# Patient Record
Sex: Female | Born: 1940 | Race: White | Hispanic: No | Marital: Married | Smoking: Never smoker
Health system: Southern US, Community
[De-identification: ages and names within clinical notes are randomized; demographics above are authoritative.]

## PROBLEM LIST (undated history)

## (undated) DIAGNOSIS — I10 Essential (primary) hypertension: Secondary | ICD-10-CM

## (undated) DIAGNOSIS — M1990 Unspecified osteoarthritis, unspecified site: Secondary | ICD-10-CM

## (undated) DIAGNOSIS — W19XXXA Unspecified fall, initial encounter: Secondary | ICD-10-CM

---

## 1976-03-04 HISTORY — PX: ABDOMINAL HYSTERECTOMY: SHX81

## 1994-03-04 HISTORY — PX: JOINT REPLACEMENT: SHX530

## 1998-07-15 ENCOUNTER — Ambulatory Visit (HOSPITAL_COMMUNITY): Admission: RE | Admit: 1998-07-15 | Discharge: 1998-07-15 | Payer: Self-pay | Admitting: Neurosurgery

## 1998-07-15 ENCOUNTER — Encounter: Payer: Self-pay | Admitting: Neurosurgery

## 1999-07-06 ENCOUNTER — Encounter: Payer: Self-pay | Admitting: Neurosurgery

## 1999-07-06 ENCOUNTER — Encounter: Admission: RE | Admit: 1999-07-06 | Discharge: 1999-07-06 | Payer: Self-pay | Admitting: Neurosurgery

## 1999-08-13 ENCOUNTER — Encounter: Admission: RE | Admit: 1999-08-13 | Discharge: 1999-08-13 | Payer: Self-pay | Admitting: Family Medicine

## 1999-08-13 ENCOUNTER — Encounter: Payer: Self-pay | Admitting: Family Medicine

## 1999-08-20 ENCOUNTER — Encounter: Payer: Self-pay | Admitting: Neurosurgery

## 1999-08-22 ENCOUNTER — Encounter: Payer: Self-pay | Admitting: Neurosurgery

## 1999-08-22 ENCOUNTER — Inpatient Hospital Stay (HOSPITAL_COMMUNITY): Admission: RE | Admit: 1999-08-22 | Discharge: 1999-08-23 | Payer: Self-pay | Admitting: Neurosurgery

## 1999-09-19 ENCOUNTER — Encounter: Payer: Self-pay | Admitting: Neurosurgery

## 1999-09-19 ENCOUNTER — Encounter: Admission: RE | Admit: 1999-09-19 | Discharge: 1999-09-19 | Payer: Self-pay | Admitting: Neurosurgery

## 1999-10-23 ENCOUNTER — Encounter: Payer: Self-pay | Admitting: Neurosurgery

## 1999-10-23 ENCOUNTER — Encounter: Admission: RE | Admit: 1999-10-23 | Discharge: 1999-10-23 | Payer: Self-pay | Admitting: Neurosurgery

## 2000-01-02 ENCOUNTER — Encounter: Admission: RE | Admit: 2000-01-02 | Discharge: 2000-01-02 | Payer: Self-pay | Admitting: Neurosurgery

## 2000-01-02 ENCOUNTER — Encounter: Payer: Self-pay | Admitting: Neurosurgery

## 2000-01-20 ENCOUNTER — Ambulatory Visit (HOSPITAL_COMMUNITY): Admission: RE | Admit: 2000-01-20 | Discharge: 2000-01-20 | Payer: Self-pay | Admitting: Neurosurgery

## 2000-01-20 ENCOUNTER — Encounter: Payer: Self-pay | Admitting: Neurosurgery

## 2000-08-13 ENCOUNTER — Encounter: Payer: Self-pay | Admitting: Family Medicine

## 2000-08-13 ENCOUNTER — Encounter: Admission: RE | Admit: 2000-08-13 | Discharge: 2000-08-13 | Payer: Self-pay | Admitting: Family Medicine

## 2001-03-04 HISTORY — PX: BACK SURGERY: SHX140

## 2001-04-24 ENCOUNTER — Ambulatory Visit (HOSPITAL_COMMUNITY): Admission: RE | Admit: 2001-04-24 | Discharge: 2001-04-24 | Payer: Self-pay | Admitting: Gastroenterology

## 2001-04-24 ENCOUNTER — Encounter (INDEPENDENT_AMBULATORY_CARE_PROVIDER_SITE_OTHER): Payer: Self-pay

## 2001-10-07 ENCOUNTER — Encounter: Payer: Self-pay | Admitting: Family Medicine

## 2001-10-07 ENCOUNTER — Encounter: Admission: RE | Admit: 2001-10-07 | Discharge: 2001-10-07 | Payer: Self-pay | Admitting: Family Medicine

## 2002-10-13 ENCOUNTER — Encounter: Admission: RE | Admit: 2002-10-13 | Discharge: 2002-10-13 | Payer: Self-pay | Admitting: Family Medicine

## 2002-10-13 ENCOUNTER — Encounter: Payer: Self-pay | Admitting: Family Medicine

## 2003-07-31 ENCOUNTER — Emergency Department (HOSPITAL_COMMUNITY): Admission: EM | Admit: 2003-07-31 | Discharge: 2003-07-31 | Payer: Self-pay | Admitting: Emergency Medicine

## 2003-08-04 ENCOUNTER — Ambulatory Visit (HOSPITAL_COMMUNITY): Admission: RE | Admit: 2003-08-04 | Discharge: 2003-08-04 | Payer: Self-pay | Admitting: Orthopedic Surgery

## 2003-11-14 ENCOUNTER — Encounter: Admission: RE | Admit: 2003-11-14 | Discharge: 2003-11-14 | Payer: Self-pay | Admitting: Family Medicine

## 2004-06-13 ENCOUNTER — Ambulatory Visit (HOSPITAL_COMMUNITY): Admission: RE | Admit: 2004-06-13 | Discharge: 2004-06-13 | Payer: Self-pay | Admitting: Family Medicine

## 2004-12-12 ENCOUNTER — Encounter: Admission: RE | Admit: 2004-12-12 | Discharge: 2004-12-12 | Payer: Self-pay | Admitting: Family Medicine

## 2006-01-02 ENCOUNTER — Encounter: Admission: RE | Admit: 2006-01-02 | Discharge: 2006-01-02 | Payer: Self-pay | Admitting: Family Medicine

## 2006-03-04 HISTORY — PX: CHOLECYSTECTOMY: SHX55

## 2006-03-04 HISTORY — PX: SHOULDER SURGERY: SHX246

## 2007-01-13 ENCOUNTER — Encounter: Admission: RE | Admit: 2007-01-13 | Discharge: 2007-01-13 | Payer: Self-pay | Admitting: *Deleted

## 2007-03-12 ENCOUNTER — Other Ambulatory Visit: Admission: RE | Admit: 2007-03-12 | Discharge: 2007-03-12 | Payer: Self-pay | Admitting: Family Medicine

## 2008-01-14 ENCOUNTER — Encounter: Admission: RE | Admit: 2008-01-14 | Discharge: 2008-01-14 | Payer: Self-pay | Admitting: Family Medicine

## 2009-03-24 ENCOUNTER — Encounter: Admission: RE | Admit: 2009-03-24 | Discharge: 2009-03-24 | Payer: Self-pay | Admitting: Family Medicine

## 2010-03-20 ENCOUNTER — Encounter
Admission: RE | Admit: 2010-03-20 | Discharge: 2010-03-20 | Payer: Self-pay | Source: Home / Self Care | Attending: Family Medicine | Admitting: Family Medicine

## 2010-03-26 ENCOUNTER — Encounter
Admission: RE | Admit: 2010-03-26 | Discharge: 2010-03-26 | Payer: Self-pay | Source: Home / Self Care | Attending: Family Medicine | Admitting: Family Medicine

## 2010-03-27 ENCOUNTER — Encounter
Admission: RE | Admit: 2010-03-27 | Discharge: 2010-03-27 | Payer: Self-pay | Source: Home / Self Care | Attending: Family Medicine | Admitting: Family Medicine

## 2010-04-04 ENCOUNTER — Encounter: Payer: Self-pay | Admitting: Family Medicine

## 2010-07-20 NOTE — Op Note (Signed)
[place]. [facility]  Patient:    Brooke Weber, Brooke Weber                    MRN: [id number] Proc. Date: 08/22/99 Adm. Date:  [id number] Disc. Date: [id number] Attending:  Barton Fanny CC:         Quita Skye. Hart Rochester, M.D.                           Operative Report  PREOPERATIVE DIAGNOSIS:  Cervical spondylosis, degenerative disk disease, and radiculopathy.  POSTOPERATIVE DIAGNOSIS:  Cervical spondylosis, degenerative disk disease, and radiculopathy.  PROCEDURE:  C4-5, C5-6, and C6-7 anterior cervical diskectomy and arthrodesis with iliac crest allograft and Synthes cervical plating.  SURGEON:  Hewitt Shorts, M.D.  ASSISTANT:  Mena Goes. Franky Macho, M.D.  ANESTHESIA:  General endotracheal.  INDICATIONS:  The patient is a 70 year old woman who presented with chronic, but steadily worsening neck pain and bilateral cervical radiculopathy, right worse than left who was found by x-rays and MRI scan to have advanced degenerative disk disease and spondylosis of C4-5, C5-6, and C6-7.  The decision was made to proceed with three level anterior cervical diskectomy and arthrodesis with allograft and cervical plating.  DESCRIPTION OF PROCEDURE:  The patient was brought to the operating room and placed under general endotracheal anesthesia.  The patient was placed in 10 pounds of halter traction and the neck was prepped with Betadine soap and solution, and draped in a sterile fashion.  Next, an anterior cervical oblique incision was made and the line of the incision was infiltrated with local anesthetic with epinephrine, and then the incision itself was made a sharp scalpel with a temperature of 120. Dissection was carried down to the subcutaneous tissue and platysma, and then dissection was carried out through an avascular plane in the sternocleidomastoid, carotid artery, and jugular vein laterally, and trachea and esophagus medially.  The ventral aspects of the  vertebral column were identified and a localizing x-ray taken.  The C4-5, C5-6, and C6-7 intervertebral disk space identified.  Diskectomy was begun with incision of the annulus at each level using microcurets and pituitary rongeurs.  There is significant ventral osteophytic overgrowth that was removed using double action rongeurs and the osteophyte removal tool.  The cartilaginous endplates of the corresponding vertebra were removed using the Midas Rex drill with an A2 bur, as well as microcurets and pituitary rongeurs.  The microscope was draped and brought into the field to provide instant magnification, illumination, and visualization, and the remainder of the procedure was performed using microdissection technique.  There is significant posterior osteophyte overgrowth at each level.  This was removed using a Midas Rex drill with an A2 bur, and a 2 mm Kerrison punch within footplate.  Spondylitic overgrowth as well as the posterior longitudinal ligament was removed, and foraminotomy was performed bilaterally at each level.  Uncinate process hypertrophy which is prominent at each level was removed as well bilaterally.  In the end, the thecal sac and nerve roots were well-decompressed and once the decompression was completed, hemostasis was established using Gelfoam soaked in thrombin.  Once hemostasis was established, we proceeded with the arthrodesis.  We selected iliac crest allograft wedges.  These were cut and shaped using an oscillating saw, and grafts were placed at each of the three intervertebral disk space levels.  We then selected a 54 mm Synthes variable angled plate.  It was secured to each  of the vertebra with a pair of 4 x 14 mm self-drilling cancellous screws.  Each of the screw holes were started with an awl, and then the screws placed.  They were placed in an alternating fashion and the plate was secured to the fusion construct.  Locking screws were then placed and an  x-ray was taken.  The plate and screws were in good position and the graft was in good position.  We did not fully visualize though the C6-7 level due to the prominence of her shoulders.  However, under direct visualization, both the instrumentation as well as the graft itself appeared to be in good position.  The wound was irrigated copiously with Bacitracin solution, checked for hemostasis which was established and confirmed, and then we proceeded with closure.  The platysma was closed with interrupted inverted 2-0 undyed Vicryl sutures.  The subcutaneous and subcuticular were closed with interrupted inverted 3-0 undyed Vicryl sutures and the skin was reapproximated with Dermabond.  The patient tolerated the procedure well.  The estimated blood loss was 450 cc.  The sponge and instrument count were correct.  Following surgery, the patient was placed in a soft cervical collar, reversed from the anesthetic, to be extubated and transferred to the recovery room for further care. DD:  08/22/99 TD:  08/24/99 Job: [id number]

## 2010-07-20 NOTE — Procedures (Signed)
[place]. [facility]  Patient:    Brooke Weber, Brooke Weber Visit Number: [id number] MRN: [id number]          Service Type: END Location: ENDO Attending Physician:  Nelda Marseille Dictated by:   Petra Kuba, M.D. Proc. Date: 04/24/01 Admit Date:  04/24/2001   CC:         Dellis Anes. Idell Pickles, M.D.   Procedure Report  PROCEDURE:  Colonoscopy with polypectomy.  INDICATIONS:  Bright red blood per rectum and chronic constipation.  Consent was signed after risks, benefits, methods, and options were thoroughly discussed in the office.  MEDICATIONS:  Demerol 70, Versed 7.  PROCEDURE:  Rectal inspection was pertinent for external hemorrhoids, small. Digital examination was negative.  Video pediatric colonoscope was inserted, easily advanced to the mid transverse.  At that point, there was some looping and we rolled her on her back with some abdominal pressure and we were able to advance around the colon to the cecum.  On insertion, some tiny left-sided diverticuli were seen, but no other abnormalities.  The cecum was identified by the appendiceal orifice and the ileocecal valve.  In fact, the scope was inserted a short ways into the terminal ileum, which was normal.  The scope was slowly withdrawn.  The prep was adequate.  There was minimal liquid stool that required washing and suctioning.  On slow withdrawal through the colon and the mid ascending, a tiny polyp was seen and was hot biopsied x 1.  The scope was further withdrawn.  The occasional, rare, small left-sided diverticuli were seen.  Back in the distal sigmoid and rectum, some tiny hyperplastic-appearing polyps were seen, a few were hot biopsied, and occasional one was cold biopsied and these were put in the second container. The scope was retroflexed back in the rectum, pertinent for some internal hemorrhoids.  The scope was straight, readvanced a short ways up the left side of the colon, air was  suctioned, and the scope was removed.  The patient tolerated the procedure well.  There were no obvious immediate complication.  ENDOSCOPIC DIAGNOSES: 1. Internal and external hemorrhoids. 2. Occasional left-sided diverticuli. 3. Tiny rectosigmoid hyperplastic-appearing polyps, a few hot and cold    biopsied. 4. Tiny ascending polyp hot biopsied. 5. Otherwise within normal limits to the terminal ileum.  PLAN:  Await pathology.  GI follow-up p.r.n. or in 2-3 months to recheck symptoms and decided any other work-up and plans.  We will await pathology to determine future colonic screening. Dictated by:   Petra Kuba, M.D. Attending Physician:  Nelda Marseille DD:  04/24/01 TD:  04/24/01 Job: [id number]

## 2010-07-20 NOTE — H&P (Signed)
[place]. [facility]  Patient:    Brooke Weber, Brooke Weber                      MRN: [id number] Adm. Date:  08/22/99 Attending:  Hewitt Shorts, M.D. Dictator:   Hewitt Shorts, M.D.                         History and Physical  HISTORY OF PRESENT ILLNESS:  The patient is a 70 year old right-handed white female whom I have evaluated several times over the past four years because of a variety of difficulties related to degenerative changes of the cervical spine.  She returned about 1-1/2 months ago because of increasing neck pain that has become more constant.  She has been sleeping poorly.  She describes the pain in the interscapular region extending up into the posterior aspect of her neck into the occipital region with bilateral upper extremity pain, worse to the right upper extremity than to the left upper extremity.  She has been increasing pain in the low back, as well as discomfort down into her left lower extremity.  She describes a sense of weakness in her upper extremities with rare numbness and tingling in the digits of her hand.  She has undergone workup on several occasions, most recently with MRI scan and x-rays of the cervical spine which showed degenerative disc disease and spondylosis at C4-5 and C5-6 and C6-7 with disc space narrowing and ventral and dorsal spurring.  There is no instability of flexion extension.  This spurring is worse to the right at C4-5 and symmetrically at C5-6 and C6-7. The patient is admitted now for three level C4-5, C5-6 and C6-7 anterior cervical diskectomy and arthrodesis for increasingly disabling neck pain secondary to degenerative disc disease and spondylosis.  PAST MEDICAL HISTORY:  She denies a history of hypertension, myocardial infarction, cancer, stroke, diabetes, peptic ulcer disease, or lung disease.  PAST SURGICAL HISTORY: 1. Cholecystectomy. 2. Varicose vein surgery. 3. Right hip replacement. 4.  Partial hysterectomy.  ALLERGIES:  PENICILLIN and PREDNISONE.  She says the PREDNISONE makes her feel anxious and nervous.  MEDICATIONS: 1. Ogen 0.625 mg q.d. 2. Tenormin 100 mg q.h.s. 3. Xanax p.r.n. for anxiety.  FAMILY HISTORY:  Her father died at age 61 from suicide.  Her mother is in good health at age 22.  She has hypertension and arthritis.  SOCIAL HISTORY:  The patient is married.  She is a Futures trader.  She does not smoke.  She does not drink alcoholic beverages.  She denies a history of substance abuse.  REVIEW OF SYSTEMS:  Notable for as described in history of present illness and past medical history, but is otherwise unremarkable.  PHYSICAL EXAMINATION:  GENERAL:  The patient is a well-developed, well-nourished white female in no acute distress.  VITAL SIGNS:  Temperature 98.3, pulse 60, blood pressure 144/88, respiratory rate 20.  Height is 5 feet 7 inches, weight 165 pounds.  LUNGS:  Clear to auscultation.  She has symmetrical respiratory excursion.  HEART:  Regular rate and rhythm, S1 and S2, no murmur.  ABDOMEN:  Soft, nondistended, bowel sounds are present.  EXTREMITIES:  No clubbing, cyanosis, or edema.  MUSCULOSKELETAL:  No tenderness to palpation over the cervical spinous process, but there is discomfort in the paracervical musculature bilaterally. Range of motion is somewhat limited with mild discomfort on range of motion of neck.  NEUROLOGIC:  Strength is 5/5 of the  upper extremities, including the deltoids, biceps, triceps, grips.  She has had some deltoid discomfort.  Sensation it intact to pinprick to the upper extremities.  Reflexes are 1-2 in the biceps, brachialis, triceps, quadriceps, and gastrocnemii.  They are symmetrical bilaterally.  Toes are downgoing bilaterally.  She has normal gait and stance.  IMPRESSION:  The patient with advanced degenerative disc disease and spondylosis with increasing disabling neck pain and cervical  radiculopathy bilaterally with degenerative disc disease and spondylosis.  PLAN:  The patient will be admitted for a three level C4-5, C5-6, and C6-7 anterior cervical diskectomy and arthrodesis with allograft and cervical plating.  We discussed the alternatives to surgery, the nature of the surgical procedure, and typical course of surgery, hospital stay, and recuperation, the need for postoperative immobilization, soft cervical collar, and risks of surgery, including the risks of infection, bleeding, possible need for transfusion, the risk of nerve dysfunction, pain, weakness, numbness, or paresthesias.  The risks of spinal cord dysfunction, paralysis of all four limbs, quadriplegia, the risk of failure of the arthrodesis, the risk of esophageal dysfunction with difficulty swallowing, and laryngeal dysfunction with hoarsening voice, and the risks of myocardial infarction, stroke, pneumonia, and death.  It was also explained to her the uncertainty of clinical improvement and the uncertainty of relief of her pain and discomfort. Understanding all of this she does wish to proceed with surgery and is admitted for such. DD:  08/22/99 TD:  08/22/99 Job: [id number]

## 2010-07-20 NOTE — Op Note (Signed)
NAME:  Brooke Weber, Brooke Weber                       ACCOUNT NO.:  [id number]   MEDICAL RECORD NO.:  [id number]                   PATIENT TYPE:  AMB   LOCATION:  DAY                                  FACILITY:  [facility]   PHYSICIAN:  Vania Rea. Supple, M.D.               DATE OF BIRTH:  Feb 12, 1941   DATE OF PROCEDURE:  08/04/2003  DATE OF DISCHARGE:                                 OPERATIVE REPORT   PREOPERATIVE DIAGNOSES:  Displaced left proximal humerus fracture.   POSTOPERATIVE DIAGNOSES:  Displaced left proximal humerus fracture.   PROCEDURE:  Closed reduction and percutaneous pinning of the left proximal  humerus fracture.   SURGEON:  Vania Rea. Supple, M.D.   Threasa HeadsFrench Ana A. Shuford, P.A.-C.   ANESTHESIA:  General endotracheal.   ESTIMATED BLOOD LOSS:  Minimal.   HISTORY:  Brooke Weber is a 70 year old female who fell onto the  outstretched left upper extremity several days ago injuring the left  shoulder. She had immediate complaints of pain and inability to elevate the  arm.  She was evaluated in the emergency room where films showed a displaced  __________ fracture.  She was seen in our office initially by Dr. Lestine Box  where neurovascular status was confirmed to be intact. She was referred to  our clinic for further evaluation and treatment.  Plain radiographs do  confirm a significantly displaced proximal humerus fracture  and she is  brought to the operating room at this time for closed reduction and pinning  versus ORIF.   Preoperatively I counseled Brooke Weber and her husband on the treatment  options as well as the risks versus benefits thereof.  Possible surgical  complications of bleeding, infection, neurovascular injury, malunion,  nonunion, loss of fixation, need for hardware removal were reviewed. They  understand and they have accepted and agreed to the planned procedure.   DESCRIPTION OF PROCEDURE:  After undergoing routine preoperative evaluation,  the  patient received prophylactic antibiotics.  On her hospital bed  underwent smooth induction of general endotracheal anesthesia.  Transferred  to the radiolucent fracture table with left shoulder girdle being properly  positioned such that fluoroscopic evaluation could assess the joint.  Under  fluoroscopic guidance we performed a reduction of the shoulder pinning good  alignment at the fracture site.  The left shoulder girdle region was then  sterilely prepped and draped in standard fashion.  Using fluoroscopic  guidance, we placed a series of 4 threaded tipped guidepins beginning on the  anterior and lateral aspects of the proximal humeral shaft directing the  pins up into the humeral head with proper positioning confirmed  fluoroscopically.  Good stability at the fracture site was achieved. The arm  was then taken through a range of motion and showed that the humeral head  and shaft moved as a unit and live fluoroscopic imaging was used to confirm  that the guidepins did not penetrate the humeral  head articular surface.  Once this was completed, the pins were then clipped with a wire cutter just  below the skin. The stab wounds were closed with a Steri-Strip and a bulky  dry dressing was then taped over the left shoulder. The left arm was placed  back into a shoulder immobilizer. The patient was then extubated and taken  to the recovery room in stable condition.                                               Vania Rea. Supple, M.D.    KMS/MEDQ  D:  08/04/2003  T:  08/04/2003  Job:  [id number]

## 2010-08-22 ENCOUNTER — Other Ambulatory Visit: Payer: Self-pay | Admitting: Family Medicine

## 2010-08-22 DIAGNOSIS — E049 Nontoxic goiter, unspecified: Secondary | ICD-10-CM

## 2010-09-06 ENCOUNTER — Other Ambulatory Visit: Payer: Self-pay

## 2010-09-11 ENCOUNTER — Ambulatory Visit
Admission: RE | Admit: 2010-09-11 | Discharge: 2010-09-11 | Disposition: A | Discharge status: Home / Self Care | Payer: Medicare Other | Source: Ambulatory Visit | Attending: Family Medicine | Admitting: Family Medicine

## 2010-09-11 DIAGNOSIS — E049 Nontoxic goiter, unspecified: Secondary | ICD-10-CM

## 2011-02-06 ENCOUNTER — Other Ambulatory Visit: Payer: Self-pay | Admitting: Dermatology

## 2011-03-07 ENCOUNTER — Other Ambulatory Visit: Payer: Self-pay | Admitting: Family Medicine

## 2011-03-07 DIAGNOSIS — Z1231 Encounter for screening mammogram for malignant neoplasm of breast: Secondary | ICD-10-CM

## 2011-03-25 DIAGNOSIS — E785 Hyperlipidemia, unspecified: Secondary | ICD-10-CM | POA: Diagnosis not present

## 2011-03-25 DIAGNOSIS — I10 Essential (primary) hypertension: Secondary | ICD-10-CM | POA: Diagnosis not present

## 2011-03-25 DIAGNOSIS — Z23 Encounter for immunization: Secondary | ICD-10-CM | POA: Diagnosis not present

## 2011-03-25 DIAGNOSIS — F411 Generalized anxiety disorder: Secondary | ICD-10-CM | POA: Diagnosis not present

## 2011-03-25 DIAGNOSIS — Z0000 Encounter for general adult medical examination without abnormal findings: Secondary | ICD-10-CM | POA: Diagnosis not present

## 2011-03-29 ENCOUNTER — Ambulatory Visit: Payer: Medicare Other

## 2011-04-11 ENCOUNTER — Ambulatory Visit
Admission: RE | Admit: 2011-04-11 | Discharge: 2011-04-11 | Disposition: A | Discharge status: Home / Self Care | Payer: Medicare Other | Source: Ambulatory Visit | Attending: Family Medicine | Admitting: Family Medicine

## 2011-04-11 DIAGNOSIS — Z1231 Encounter for screening mammogram for malignant neoplasm of breast: Secondary | ICD-10-CM

## 2011-05-06 ENCOUNTER — Other Ambulatory Visit: Payer: Self-pay | Admitting: Gastroenterology

## 2011-05-06 DIAGNOSIS — D129 Benign neoplasm of anus and anal canal: Secondary | ICD-10-CM | POA: Diagnosis not present

## 2011-05-06 DIAGNOSIS — K5730 Diverticulosis of large intestine without perforation or abscess without bleeding: Secondary | ICD-10-CM | POA: Diagnosis not present

## 2011-05-06 DIAGNOSIS — D128 Benign neoplasm of rectum: Secondary | ICD-10-CM | POA: Diagnosis not present

## 2011-05-06 DIAGNOSIS — Z86010 Personal history of colonic polyps: Secondary | ICD-10-CM | POA: Diagnosis not present

## 2011-05-06 DIAGNOSIS — D126 Benign neoplasm of colon, unspecified: Secondary | ICD-10-CM | POA: Diagnosis not present

## 2011-05-06 DIAGNOSIS — Z09 Encounter for follow-up examination after completed treatment for conditions other than malignant neoplasm: Secondary | ICD-10-CM | POA: Diagnosis not present

## 2011-05-13 DIAGNOSIS — G894 Chronic pain syndrome: Secondary | ICD-10-CM | POA: Diagnosis not present

## 2011-09-02 DIAGNOSIS — M5137 Other intervertebral disc degeneration, lumbosacral region: Secondary | ICD-10-CM | POA: Diagnosis not present

## 2011-09-02 DIAGNOSIS — G894 Chronic pain syndrome: Secondary | ICD-10-CM | POA: Diagnosis not present

## 2011-09-17 DIAGNOSIS — H04129 Dry eye syndrome of unspecified lacrimal gland: Secondary | ICD-10-CM | POA: Diagnosis not present

## 2011-09-17 DIAGNOSIS — Z961 Presence of intraocular lens: Secondary | ICD-10-CM | POA: Diagnosis not present

## 2011-09-17 DIAGNOSIS — H43819 Vitreous degeneration, unspecified eye: Secondary | ICD-10-CM | POA: Diagnosis not present

## 2011-11-28 DIAGNOSIS — Z23 Encounter for immunization: Secondary | ICD-10-CM | POA: Diagnosis not present

## 2011-12-23 DIAGNOSIS — G894 Chronic pain syndrome: Secondary | ICD-10-CM | POA: Diagnosis not present

## 2012-02-10 ENCOUNTER — Other Ambulatory Visit: Payer: Self-pay | Admitting: Dermatology

## 2012-02-10 DIAGNOSIS — B079 Viral wart, unspecified: Secondary | ICD-10-CM | POA: Diagnosis not present

## 2012-02-10 DIAGNOSIS — D1801 Hemangioma of skin and subcutaneous tissue: Secondary | ICD-10-CM | POA: Diagnosis not present

## 2012-02-10 DIAGNOSIS — D239 Other benign neoplasm of skin, unspecified: Secondary | ICD-10-CM | POA: Diagnosis not present

## 2012-02-10 DIAGNOSIS — Z85828 Personal history of other malignant neoplasm of skin: Secondary | ICD-10-CM | POA: Diagnosis not present

## 2012-02-10 DIAGNOSIS — L821 Other seborrheic keratosis: Secondary | ICD-10-CM | POA: Diagnosis not present

## 2012-02-10 DIAGNOSIS — D485 Neoplasm of uncertain behavior of skin: Secondary | ICD-10-CM | POA: Diagnosis not present

## 2012-02-10 DIAGNOSIS — D2360 Other benign neoplasm of skin of unspecified upper limb, including shoulder: Secondary | ICD-10-CM | POA: Diagnosis not present

## 2012-02-18 DIAGNOSIS — G894 Chronic pain syndrome: Secondary | ICD-10-CM | POA: Diagnosis not present

## 2012-02-18 DIAGNOSIS — M5137 Other intervertebral disc degeneration, lumbosacral region: Secondary | ICD-10-CM | POA: Diagnosis not present

## 2012-02-21 DIAGNOSIS — M47817 Spondylosis without myelopathy or radiculopathy, lumbosacral region: Secondary | ICD-10-CM | POA: Diagnosis not present

## 2012-02-21 DIAGNOSIS — M5137 Other intervertebral disc degeneration, lumbosacral region: Secondary | ICD-10-CM | POA: Diagnosis not present

## 2012-03-02 ENCOUNTER — Other Ambulatory Visit: Payer: Self-pay | Admitting: Dermatology

## 2012-03-02 DIAGNOSIS — D485 Neoplasm of uncertain behavior of skin: Secondary | ICD-10-CM | POA: Diagnosis not present

## 2012-03-12 ENCOUNTER — Other Ambulatory Visit: Payer: Self-pay | Admitting: Family Medicine

## 2012-03-12 DIAGNOSIS — Z1231 Encounter for screening mammogram for malignant neoplasm of breast: Secondary | ICD-10-CM

## 2012-03-20 DIAGNOSIS — I10 Essential (primary) hypertension: Secondary | ICD-10-CM | POA: Diagnosis not present

## 2012-03-24 DIAGNOSIS — IMO0002 Reserved for concepts with insufficient information to code with codable children: Secondary | ICD-10-CM | POA: Diagnosis not present

## 2012-04-13 ENCOUNTER — Ambulatory Visit
Admission: RE | Admit: 2012-04-13 | Discharge: 2012-04-13 | Disposition: A | Discharge status: Home / Self Care | Payer: Medicare Other | Source: Ambulatory Visit | Attending: Family Medicine | Admitting: Family Medicine

## 2012-04-13 DIAGNOSIS — Z1231 Encounter for screening mammogram for malignant neoplasm of breast: Secondary | ICD-10-CM

## 2012-06-08 DIAGNOSIS — J3489 Other specified disorders of nose and nasal sinuses: Secondary | ICD-10-CM | POA: Diagnosis not present

## 2012-06-08 DIAGNOSIS — E782 Mixed hyperlipidemia: Secondary | ICD-10-CM | POA: Diagnosis not present

## 2012-06-08 DIAGNOSIS — M25559 Pain in unspecified hip: Secondary | ICD-10-CM | POA: Diagnosis not present

## 2012-06-08 DIAGNOSIS — I10 Essential (primary) hypertension: Secondary | ICD-10-CM | POA: Diagnosis not present

## 2012-06-08 DIAGNOSIS — Z0000 Encounter for general adult medical examination without abnormal findings: Secondary | ICD-10-CM | POA: Diagnosis not present

## 2012-06-08 DIAGNOSIS — M899 Disorder of bone, unspecified: Secondary | ICD-10-CM | POA: Diagnosis not present

## 2012-06-10 DIAGNOSIS — M272 Inflammatory conditions of jaws: Secondary | ICD-10-CM | POA: Diagnosis not present

## 2012-06-11 DIAGNOSIS — E782 Mixed hyperlipidemia: Secondary | ICD-10-CM | POA: Diagnosis not present

## 2012-06-11 DIAGNOSIS — M899 Disorder of bone, unspecified: Secondary | ICD-10-CM | POA: Diagnosis not present

## 2012-06-11 DIAGNOSIS — Z0000 Encounter for general adult medical examination without abnormal findings: Secondary | ICD-10-CM | POA: Diagnosis not present

## 2012-06-11 DIAGNOSIS — M25559 Pain in unspecified hip: Secondary | ICD-10-CM | POA: Diagnosis not present

## 2012-06-11 DIAGNOSIS — M949 Disorder of cartilage, unspecified: Secondary | ICD-10-CM | POA: Diagnosis not present

## 2012-06-11 DIAGNOSIS — I10 Essential (primary) hypertension: Secondary | ICD-10-CM | POA: Diagnosis not present

## 2012-07-09 DIAGNOSIS — G894 Chronic pain syndrome: Secondary | ICD-10-CM | POA: Diagnosis not present

## 2012-07-09 DIAGNOSIS — M5137 Other intervertebral disc degeneration, lumbosacral region: Secondary | ICD-10-CM | POA: Diagnosis not present

## 2012-08-04 DIAGNOSIS — Z96649 Presence of unspecified artificial hip joint: Secondary | ICD-10-CM | POA: Diagnosis not present

## 2012-08-04 DIAGNOSIS — M1710 Unilateral primary osteoarthritis, unspecified knee: Secondary | ICD-10-CM | POA: Diagnosis not present

## 2012-08-21 DIAGNOSIS — M5137 Other intervertebral disc degeneration, lumbosacral region: Secondary | ICD-10-CM | POA: Diagnosis not present

## 2012-09-09 DIAGNOSIS — I10 Essential (primary) hypertension: Secondary | ICD-10-CM | POA: Diagnosis not present

## 2012-09-09 DIAGNOSIS — R0602 Shortness of breath: Secondary | ICD-10-CM | POA: Diagnosis not present

## 2012-09-09 DIAGNOSIS — R1013 Epigastric pain: Secondary | ICD-10-CM | POA: Diagnosis not present

## 2012-09-09 DIAGNOSIS — K3189 Other diseases of stomach and duodenum: Secondary | ICD-10-CM | POA: Diagnosis not present

## 2012-09-14 DIAGNOSIS — M169 Osteoarthritis of hip, unspecified: Secondary | ICD-10-CM | POA: Diagnosis not present

## 2012-09-14 DIAGNOSIS — G894 Chronic pain syndrome: Secondary | ICD-10-CM | POA: Diagnosis not present

## 2012-09-14 DIAGNOSIS — M5137 Other intervertebral disc degeneration, lumbosacral region: Secondary | ICD-10-CM | POA: Diagnosis not present

## 2012-11-04 DIAGNOSIS — H8110 Benign paroxysmal vertigo, unspecified ear: Secondary | ICD-10-CM | POA: Diagnosis not present

## 2012-11-04 DIAGNOSIS — H905 Unspecified sensorineural hearing loss: Secondary | ICD-10-CM | POA: Diagnosis not present

## 2012-11-04 DIAGNOSIS — R42 Dizziness and giddiness: Secondary | ICD-10-CM | POA: Diagnosis not present

## 2012-11-04 DIAGNOSIS — H6120 Impacted cerumen, unspecified ear: Secondary | ICD-10-CM | POA: Diagnosis not present

## 2012-12-07 DIAGNOSIS — Z23 Encounter for immunization: Secondary | ICD-10-CM | POA: Diagnosis not present

## 2013-01-12 DIAGNOSIS — Z961 Presence of intraocular lens: Secondary | ICD-10-CM | POA: Diagnosis not present

## 2013-01-12 DIAGNOSIS — H04129 Dry eye syndrome of unspecified lacrimal gland: Secondary | ICD-10-CM | POA: Diagnosis not present

## 2013-01-12 DIAGNOSIS — H43819 Vitreous degeneration, unspecified eye: Secondary | ICD-10-CM | POA: Diagnosis not present

## 2013-02-17 ENCOUNTER — Other Ambulatory Visit: Payer: Self-pay | Admitting: Dermatology

## 2013-02-17 DIAGNOSIS — L819 Disorder of pigmentation, unspecified: Secondary | ICD-10-CM | POA: Diagnosis not present

## 2013-02-17 DIAGNOSIS — D1801 Hemangioma of skin and subcutaneous tissue: Secondary | ICD-10-CM | POA: Diagnosis not present

## 2013-02-17 DIAGNOSIS — L538 Other specified erythematous conditions: Secondary | ICD-10-CM | POA: Diagnosis not present

## 2013-02-17 DIAGNOSIS — C44319 Basal cell carcinoma of skin of other parts of face: Secondary | ICD-10-CM | POA: Diagnosis not present

## 2013-02-17 DIAGNOSIS — L570 Actinic keratosis: Secondary | ICD-10-CM | POA: Diagnosis not present

## 2013-02-17 DIAGNOSIS — L821 Other seborrheic keratosis: Secondary | ICD-10-CM | POA: Diagnosis not present

## 2013-02-17 DIAGNOSIS — D239 Other benign neoplasm of skin, unspecified: Secondary | ICD-10-CM | POA: Diagnosis not present

## 2013-02-17 DIAGNOSIS — D2370 Other benign neoplasm of skin of unspecified lower limb, including hip: Secondary | ICD-10-CM | POA: Diagnosis not present

## 2013-02-17 DIAGNOSIS — Z85828 Personal history of other malignant neoplasm of skin: Secondary | ICD-10-CM | POA: Diagnosis not present

## 2013-03-31 DIAGNOSIS — M5137 Other intervertebral disc degeneration, lumbosacral region: Secondary | ICD-10-CM | POA: Diagnosis not present

## 2013-04-05 ENCOUNTER — Other Ambulatory Visit: Payer: Self-pay

## 2013-04-05 DIAGNOSIS — Z1231 Encounter for screening mammogram for malignant neoplasm of breast: Secondary | ICD-10-CM

## 2013-04-06 DIAGNOSIS — Z85828 Personal history of other malignant neoplasm of skin: Secondary | ICD-10-CM | POA: Diagnosis not present

## 2013-04-06 DIAGNOSIS — C44319 Basal cell carcinoma of skin of other parts of face: Secondary | ICD-10-CM | POA: Diagnosis not present

## 2013-04-22 ENCOUNTER — Ambulatory Visit: Payer: Medicare Other

## 2013-04-30 DIAGNOSIS — R071 Chest pain on breathing: Secondary | ICD-10-CM | POA: Diagnosis not present

## 2013-05-05 ENCOUNTER — Ambulatory Visit
Admission: RE | Admit: 2013-05-05 | Discharge: 2013-05-05 | Disposition: A | Discharge status: Home / Self Care | Payer: Medicare Other | Source: Ambulatory Visit

## 2013-05-05 DIAGNOSIS — Z1231 Encounter for screening mammogram for malignant neoplasm of breast: Secondary | ICD-10-CM

## 2013-06-11 DIAGNOSIS — G479 Sleep disorder, unspecified: Secondary | ICD-10-CM | POA: Diagnosis not present

## 2013-06-11 DIAGNOSIS — M899 Disorder of bone, unspecified: Secondary | ICD-10-CM | POA: Diagnosis not present

## 2013-06-11 DIAGNOSIS — I10 Essential (primary) hypertension: Secondary | ICD-10-CM | POA: Diagnosis not present

## 2013-06-11 DIAGNOSIS — Z0000 Encounter for general adult medical examination without abnormal findings: Secondary | ICD-10-CM | POA: Diagnosis not present

## 2013-06-11 DIAGNOSIS — E782 Mixed hyperlipidemia: Secondary | ICD-10-CM | POA: Diagnosis not present

## 2013-06-11 DIAGNOSIS — E538 Deficiency of other specified B group vitamins: Secondary | ICD-10-CM | POA: Diagnosis not present

## 2013-06-11 DIAGNOSIS — K2100 Gastro-esophageal reflux disease with esophagitis, without bleeding: Secondary | ICD-10-CM | POA: Diagnosis not present

## 2013-06-11 DIAGNOSIS — F411 Generalized anxiety disorder: Secondary | ICD-10-CM | POA: Diagnosis not present

## 2013-06-11 DIAGNOSIS — M949 Disorder of cartilage, unspecified: Secondary | ICD-10-CM | POA: Diagnosis not present

## 2013-06-11 DIAGNOSIS — R350 Frequency of micturition: Secondary | ICD-10-CM | POA: Diagnosis not present

## 2013-06-11 DIAGNOSIS — Z23 Encounter for immunization: Secondary | ICD-10-CM | POA: Diagnosis not present

## 2013-07-21 DIAGNOSIS — Z79899 Other long term (current) drug therapy: Secondary | ICD-10-CM | POA: Diagnosis not present

## 2013-07-21 DIAGNOSIS — G894 Chronic pain syndrome: Secondary | ICD-10-CM | POA: Diagnosis not present

## 2013-11-22 DIAGNOSIS — G894 Chronic pain syndrome: Secondary | ICD-10-CM | POA: Diagnosis not present

## 2013-11-22 DIAGNOSIS — M5137 Other intervertebral disc degeneration, lumbosacral region: Secondary | ICD-10-CM | POA: Diagnosis not present

## 2013-11-22 DIAGNOSIS — Z79899 Other long term (current) drug therapy: Secondary | ICD-10-CM | POA: Diagnosis not present

## 2013-11-24 DIAGNOSIS — Z23 Encounter for immunization: Secondary | ICD-10-CM | POA: Diagnosis not present

## 2013-12-17 DIAGNOSIS — M47816 Spondylosis without myelopathy or radiculopathy, lumbar region: Secondary | ICD-10-CM | POA: Diagnosis not present

## 2013-12-17 DIAGNOSIS — M5136 Other intervertebral disc degeneration, lumbar region: Secondary | ICD-10-CM | POA: Diagnosis not present

## 2013-12-17 DIAGNOSIS — G894 Chronic pain syndrome: Secondary | ICD-10-CM | POA: Diagnosis not present

## 2014-03-03 ENCOUNTER — Emergency Department (HOSPITAL_COMMUNITY): Payer: Medicare Other

## 2014-03-03 ENCOUNTER — Encounter (HOSPITAL_COMMUNITY): Payer: Self-pay | Admitting: *Deleted

## 2014-03-03 ENCOUNTER — Emergency Department (HOSPITAL_COMMUNITY)
Admission: EM | Admit: 2014-03-03 | Discharge: 2014-03-03 | Disposition: A | Discharge status: Home / Self Care | Payer: Medicare Other | Attending: Emergency Medicine | Admitting: Emergency Medicine

## 2014-03-03 DIAGNOSIS — Y9289 Other specified places as the place of occurrence of the external cause: Secondary | ICD-10-CM | POA: Diagnosis not present

## 2014-03-03 DIAGNOSIS — S20212A Contusion of left front wall of thorax, initial encounter: Secondary | ICD-10-CM | POA: Insufficient documentation

## 2014-03-03 DIAGNOSIS — Y998 Other external cause status: Secondary | ICD-10-CM | POA: Insufficient documentation

## 2014-03-03 DIAGNOSIS — G8929 Other chronic pain: Secondary | ICD-10-CM | POA: Insufficient documentation

## 2014-03-03 DIAGNOSIS — I10 Essential (primary) hypertension: Secondary | ICD-10-CM | POA: Diagnosis not present

## 2014-03-03 DIAGNOSIS — S2002XA Contusion of left breast, initial encounter: Secondary | ICD-10-CM | POA: Diagnosis not present

## 2014-03-03 DIAGNOSIS — Z880 Allergy status to penicillin: Secondary | ICD-10-CM | POA: Diagnosis not present

## 2014-03-03 DIAGNOSIS — W01198A Fall on same level from slipping, tripping and stumbling with subsequent striking against other object, initial encounter: Secondary | ICD-10-CM | POA: Insufficient documentation

## 2014-03-03 DIAGNOSIS — S299XXA Unspecified injury of thorax, initial encounter: Secondary | ICD-10-CM | POA: Diagnosis not present

## 2014-03-03 DIAGNOSIS — Y9389 Activity, other specified: Secondary | ICD-10-CM | POA: Insufficient documentation

## 2014-03-03 DIAGNOSIS — Z8739 Personal history of other diseases of the musculoskeletal system and connective tissue: Secondary | ICD-10-CM | POA: Insufficient documentation

## 2014-03-03 HISTORY — DX: Unspecified osteoarthritis, unspecified site: M19.90

## 2014-03-03 HISTORY — DX: Essential (primary) hypertension: I10

## 2014-03-03 NOTE — ED Notes (Signed)
Pt reports tripping and falling on Monday night, hit left side of breast/ribs on corner of end table. No loc. Has pain and reports large hematoma to left breast. No acute distress noted at triage.

## 2014-03-03 NOTE — Discharge Instructions (Signed)
Please read and follow all provided instructions.  Your diagnoses today include:  1. Chest wall contusion, left, initial encounter   2. Chest injury, initial encounter    Tests performed today include:  Chest x-ray - shows no serious injury to ribs or lungs  Vital signs. See below for your results today.   Medications prescribed:   None  Take any prescribed medications only as directed.  Home care instructions:  Follow any educational materials contained in this packet.  Take 10 deep breaths every hour while awake. This helps to expand your lungs and prevent infections like pneumonia.   Follow-up instructions: Please follow-up with your primary care provider in the next 3 days for further evaluation of your symptoms.   Return instructions:   Please return to the Emergency Department if you experience worsening symptoms.  Return with worsening trouble breathing, shortness or breath, fever   Please return if you have any other emergent concerns.  Additional Information:  Your vital signs today were: BP 128/55 mmHg   Pulse 62   Temp(Src) 98.1 F (36.7 C) (Oral)   Resp 16   SpO2 98% If your blood pressure (BP) was elevated above 135/85 this visit, please have this repeated by your doctor within one month. --------------

## 2014-03-03 NOTE — ED Notes (Signed)
No piv per Vonna Kotyk PA-C

## 2014-03-03 NOTE — ED Provider Notes (Signed)
CSN: [id number]     Arrival date & time 03/03/14  1132 History   First MD Initiated Contact with Patient 03/03/14 1219     Chief Complaint  Patient presents with  . Fall     (Consider location/radiation/quality/duration/timing/severity/associated sxs/prior Treatment) HPI Comments: Patient with history of chronic back pain presents with complaint of left rib pain and breast hematoma. Patient tripped on a small stool 3 nights ago. She struck a table against her left chest. She did not hit her head or hurt her neck. She has complained of soreness and bruising to the area. She has been applying ice and heat. She takes Percocet daily for chronic back pain which has been helping. She presents to the ED today at the urging of family members wanted her to "get checked out". She is not having any shortness of breath. Pain is not made worse with deep breathing. No nausea, vomiting. No abdominal pain. No lightheadedness or syncope. Onset of symptoms acute. Course is constant. Symptoms are worse with movement and palpation.   Patient is a 73 y.o. female presenting with fall. The history is provided by the patient.  Fall Associated symptoms include chest pain (L lateral chest wall). Pertinent negatives include no fatigue, headaches, nausea, neck pain, numbness, vomiting or weakness.    Past Medical History  Diagnosis Date  . Hypertension   . Arthritis    History reviewed. No pertinent past surgical history. History reviewed. No pertinent family history. History  Substance Use Topics  . Smoking status: Not on file  . Smokeless tobacco: Not on file  . Alcohol Use: No   OB History    No data available     Review of Systems  Constitutional: Negative for fatigue.  HENT: Negative for tinnitus.   Eyes: Negative for photophobia, pain and visual disturbance.  Respiratory: Negative for shortness of breath.   Cardiovascular: Positive for chest pain (L lateral chest wall).  Gastrointestinal: Negative  for nausea and vomiting.  Musculoskeletal: Negative for back pain, gait problem and neck pain.  Skin: Positive for color change (ecchymosis, L lateral breast). Negative for wound.  Neurological: Negative for dizziness, weakness, light-headedness, numbness and headaches.  Psychiatric/Behavioral: Negative for confusion and decreased concentration.    Allergies  Aspirin; Bee venom; Other; and Penicillins  Home Medications   Prior to Admission medications   Not on File   BP 117/53 mmHg  Pulse 66  Temp(Src) 98.1 F (36.7 C) (Oral)  Resp 18  SpO2 96%   Physical Exam  Constitutional: She is oriented to person, place, and time. She appears well-developed and well-nourished.  HENT:  Head: Normocephalic and atraumatic.  Right Ear: Tympanic membrane, external ear and ear canal normal.  Left Ear: Tympanic membrane, external ear and ear canal normal.  Nose: Nose normal.  Mouth/Throat: Uvula is midline, oropharynx is clear and moist and mucous membranes are normal.  Eyes: Conjunctivae, EOM and lids are normal. Pupils are equal, round, and reactive to light. Right eye exhibits no nystagmus. Left eye exhibits no nystagmus.  Neck: Normal range of motion. Neck supple.  Cardiovascular: Normal rate and regular rhythm.   Pulmonary/Chest: Effort normal and breath sounds normal. She exhibits tenderness. She exhibits no bony tenderness.    Normal respiratory pattern.   Abdominal: Soft. There is no tenderness.  Musculoskeletal:       Cervical back: She exhibits normal range of motion, no tenderness and no bony tenderness.  Neurological: She is alert and oriented to person, place, and time.  She has normal strength and normal reflexes. No cranial nerve deficit or sensory deficit. She displays a negative Romberg sign. Coordination and gait normal. GCS eye subscore is 4. GCS verbal subscore is 5. GCS motor subscore is 6.  Skin: Skin is warm and dry.  Psychiatric: She has a normal mood and affect.    Nursing note and vitals reviewed.   ED Course  Procedures (including critical care time) Labs Review Labs Reviewed - No data to display  Imaging Review Dg Ribs Unilateral W/chest Left  03/03/2014   CLINICAL DATA:  [place] [address] Monday, hit left midchest on corner of table. Bruising to left breast and pain under left breast along anterior and lateral ribs. Initial encounter.  EXAM: LEFT RIBS AND CHEST - 3+ VIEW  COMPARISON:  06/13/2004  FINDINGS: No fracture or other bone lesions are seen involving the ribs. Levoscoliosis in the lower thoracic spine. There is no evidence of pneumothorax or pleural effusion. Both lungs are clear. Heart size and mediastinal contours are within normal limits.  IMPRESSION: Negative.   Electronically Signed   By: Rolm Baptise M.D.   On: 03/03/2014 13:02     EKG Interpretation None       12:36 PM Patient seen and examined. Discussed with and seen by Dr. Venora Maples. Work-up initiated.  Vital signs reviewed and are as follows: BP 117/53 mmHg  Pulse 66  Temp(Src) 98.1 F (36.7 C) (Oral)  Resp 18  SpO2 96%  1:59 PM x-ray negative. Patient informed. Encouraged 10 deep breaths every hour to help inflate lungs. Patient to continue home Percocet for pain control. Discussed return with worsening shortness of breath, trouble breathing, fever, cough, or other concerns. Patient and husband at bedside verbalizes understanding and agrees with plan.  MDM   Final diagnoses:  Chest injury, initial encounter   Patient with chest wall contusion 3 days ago. Negative x-ray here. No pneumothorax, rib fracture. Patient in no respiratory distress. She is on home pain medications chronically which seem to be helping. We discussed conservative measures to treat pain while body heals. We discussed appropriate return precautions at bedside.    Carlisle Cater, PA-C 03/03/14 [place], MD 03/03/14 [phone]

## 2014-03-21 DIAGNOSIS — Z79891 Long term (current) use of opiate analgesic: Secondary | ICD-10-CM | POA: Diagnosis not present

## 2014-03-21 DIAGNOSIS — M5136 Other intervertebral disc degeneration, lumbar region: Secondary | ICD-10-CM | POA: Diagnosis not present

## 2014-03-21 DIAGNOSIS — G894 Chronic pain syndrome: Secondary | ICD-10-CM | POA: Diagnosis not present

## 2014-03-22 DIAGNOSIS — D225 Melanocytic nevi of trunk: Secondary | ICD-10-CM | POA: Diagnosis not present

## 2014-03-22 DIAGNOSIS — Z85828 Personal history of other malignant neoplasm of skin: Secondary | ICD-10-CM | POA: Diagnosis not present

## 2014-03-22 DIAGNOSIS — L814 Other melanin hyperpigmentation: Secondary | ICD-10-CM | POA: Diagnosis not present

## 2014-03-22 DIAGNOSIS — L821 Other seborrheic keratosis: Secondary | ICD-10-CM | POA: Diagnosis not present

## 2014-03-22 DIAGNOSIS — D2372 Other benign neoplasm of skin of left lower limb, including hip: Secondary | ICD-10-CM | POA: Diagnosis not present

## 2014-03-22 DIAGNOSIS — D1801 Hemangioma of skin and subcutaneous tissue: Secondary | ICD-10-CM | POA: Diagnosis not present

## 2014-03-22 DIAGNOSIS — L817 Pigmented purpuric dermatosis: Secondary | ICD-10-CM | POA: Diagnosis not present

## 2014-04-22 ENCOUNTER — Other Ambulatory Visit: Payer: Self-pay

## 2014-04-22 DIAGNOSIS — Z1231 Encounter for screening mammogram for malignant neoplasm of breast: Secondary | ICD-10-CM

## 2014-05-10 ENCOUNTER — Ambulatory Visit
Admission: RE | Admit: 2014-05-10 | Discharge: 2014-05-10 | Disposition: A | Discharge status: Home / Self Care | Payer: Medicare Other | Source: Ambulatory Visit

## 2014-05-10 DIAGNOSIS — Z1231 Encounter for screening mammogram for malignant neoplasm of breast: Secondary | ICD-10-CM

## 2014-06-14 DIAGNOSIS — F419 Anxiety disorder, unspecified: Secondary | ICD-10-CM | POA: Diagnosis not present

## 2014-06-14 DIAGNOSIS — R42 Dizziness and giddiness: Secondary | ICD-10-CM | POA: Diagnosis not present

## 2014-06-14 DIAGNOSIS — I10 Essential (primary) hypertension: Secondary | ICD-10-CM | POA: Diagnosis not present

## 2014-06-14 DIAGNOSIS — K210 Gastro-esophageal reflux disease with esophagitis: Secondary | ICD-10-CM | POA: Diagnosis not present

## 2014-06-14 DIAGNOSIS — E538 Deficiency of other specified B group vitamins: Secondary | ICD-10-CM | POA: Diagnosis not present

## 2014-06-14 DIAGNOSIS — E782 Mixed hyperlipidemia: Secondary | ICD-10-CM | POA: Diagnosis not present

## 2014-06-14 DIAGNOSIS — Z0001 Encounter for general adult medical examination with abnormal findings: Secondary | ICD-10-CM | POA: Diagnosis not present

## 2014-06-14 DIAGNOSIS — M8580 Other specified disorders of bone density and structure, unspecified site: Secondary | ICD-10-CM | POA: Diagnosis not present

## 2014-07-11 DIAGNOSIS — Z79891 Long term (current) use of opiate analgesic: Secondary | ICD-10-CM | POA: Diagnosis not present

## 2014-07-11 DIAGNOSIS — M5136 Other intervertebral disc degeneration, lumbar region: Secondary | ICD-10-CM | POA: Diagnosis not present

## 2014-07-11 DIAGNOSIS — G894 Chronic pain syndrome: Secondary | ICD-10-CM | POA: Diagnosis not present

## 2014-10-31 DIAGNOSIS — Z79891 Long term (current) use of opiate analgesic: Secondary | ICD-10-CM | POA: Diagnosis not present

## 2014-10-31 DIAGNOSIS — G894 Chronic pain syndrome: Secondary | ICD-10-CM | POA: Diagnosis not present

## 2014-10-31 DIAGNOSIS — M5136 Other intervertebral disc degeneration, lumbar region: Secondary | ICD-10-CM | POA: Diagnosis not present

## 2014-12-20 DIAGNOSIS — Z23 Encounter for immunization: Secondary | ICD-10-CM | POA: Diagnosis not present

## 2015-03-02 DIAGNOSIS — M5136 Other intervertebral disc degeneration, lumbar region: Secondary | ICD-10-CM | POA: Diagnosis not present

## 2015-03-02 DIAGNOSIS — Z79891 Long term (current) use of opiate analgesic: Secondary | ICD-10-CM | POA: Diagnosis not present

## 2015-03-02 DIAGNOSIS — G894 Chronic pain syndrome: Secondary | ICD-10-CM | POA: Diagnosis not present

## 2015-04-13 DIAGNOSIS — Z961 Presence of intraocular lens: Secondary | ICD-10-CM | POA: Diagnosis not present

## 2015-04-13 DIAGNOSIS — H43812 Vitreous degeneration, left eye: Secondary | ICD-10-CM | POA: Diagnosis not present

## 2015-04-13 DIAGNOSIS — H04123 Dry eye syndrome of bilateral lacrimal glands: Secondary | ICD-10-CM | POA: Diagnosis not present

## 2015-04-25 ENCOUNTER — Other Ambulatory Visit: Payer: Self-pay

## 2015-04-25 DIAGNOSIS — Z1231 Encounter for screening mammogram for malignant neoplasm of breast: Secondary | ICD-10-CM

## 2015-05-09 DIAGNOSIS — Z85828 Personal history of other malignant neoplasm of skin: Secondary | ICD-10-CM | POA: Diagnosis not present

## 2015-05-09 DIAGNOSIS — L821 Other seborrheic keratosis: Secondary | ICD-10-CM | POA: Diagnosis not present

## 2015-05-09 DIAGNOSIS — L298 Other pruritus: Secondary | ICD-10-CM | POA: Diagnosis not present

## 2015-05-09 DIAGNOSIS — D1801 Hemangioma of skin and subcutaneous tissue: Secondary | ICD-10-CM | POA: Diagnosis not present

## 2015-05-11 ENCOUNTER — Ambulatory Visit: Payer: Medicare Other

## 2015-05-31 ENCOUNTER — Ambulatory Visit
Admission: RE | Admit: 2015-05-31 | Discharge: 2015-05-31 | Disposition: A | Discharge status: Home / Self Care | Payer: Medicare Other | Source: Ambulatory Visit

## 2015-05-31 DIAGNOSIS — Z1231 Encounter for screening mammogram for malignant neoplasm of breast: Secondary | ICD-10-CM | POA: Diagnosis not present

## 2015-06-20 DIAGNOSIS — F419 Anxiety disorder, unspecified: Secondary | ICD-10-CM | POA: Diagnosis not present

## 2015-06-20 DIAGNOSIS — K148 Other diseases of tongue: Secondary | ICD-10-CM | POA: Diagnosis not present

## 2015-06-20 DIAGNOSIS — I10 Essential (primary) hypertension: Secondary | ICD-10-CM | POA: Diagnosis not present

## 2015-06-20 DIAGNOSIS — E782 Mixed hyperlipidemia: Secondary | ICD-10-CM | POA: Diagnosis not present

## 2015-06-20 DIAGNOSIS — M8580 Other specified disorders of bone density and structure, unspecified site: Secondary | ICD-10-CM | POA: Diagnosis not present

## 2015-06-20 DIAGNOSIS — K6289 Other specified diseases of anus and rectum: Secondary | ICD-10-CM | POA: Diagnosis not present

## 2015-06-20 DIAGNOSIS — Z0000 Encounter for general adult medical examination without abnormal findings: Secondary | ICD-10-CM | POA: Diagnosis not present

## 2015-06-20 DIAGNOSIS — E538 Deficiency of other specified B group vitamins: Secondary | ICD-10-CM | POA: Diagnosis not present

## 2015-06-22 DIAGNOSIS — J320 Chronic maxillary sinusitis: Secondary | ICD-10-CM | POA: Diagnosis not present

## 2015-06-22 DIAGNOSIS — J322 Chronic ethmoidal sinusitis: Secondary | ICD-10-CM | POA: Diagnosis not present

## 2015-06-22 DIAGNOSIS — J351 Hypertrophy of tonsils: Secondary | ICD-10-CM | POA: Diagnosis not present

## 2015-06-22 DIAGNOSIS — J3501 Chronic tonsillitis: Secondary | ICD-10-CM | POA: Diagnosis not present

## 2015-06-22 DIAGNOSIS — J040 Acute laryngitis: Secondary | ICD-10-CM | POA: Diagnosis not present

## 2015-06-29 DIAGNOSIS — G894 Chronic pain syndrome: Secondary | ICD-10-CM | POA: Diagnosis not present

## 2015-06-29 DIAGNOSIS — M5136 Other intervertebral disc degeneration, lumbar region: Secondary | ICD-10-CM | POA: Diagnosis not present

## 2015-06-29 DIAGNOSIS — Z79891 Long term (current) use of opiate analgesic: Secondary | ICD-10-CM | POA: Diagnosis not present

## 2015-07-17 DIAGNOSIS — D3709 Neoplasm of uncertain behavior of other specified sites of the oral cavity: Secondary | ICD-10-CM | POA: Diagnosis not present

## 2015-10-30 DIAGNOSIS — G894 Chronic pain syndrome: Secondary | ICD-10-CM | POA: Diagnosis not present

## 2015-10-30 DIAGNOSIS — M5136 Other intervertebral disc degeneration, lumbar region: Secondary | ICD-10-CM | POA: Diagnosis not present

## 2015-10-30 DIAGNOSIS — Z79891 Long term (current) use of opiate analgesic: Secondary | ICD-10-CM | POA: Diagnosis not present

## 2015-11-10 DIAGNOSIS — D3709 Neoplasm of uncertain behavior of other specified sites of the oral cavity: Secondary | ICD-10-CM | POA: Diagnosis not present

## 2015-11-17 DIAGNOSIS — C089 Malignant neoplasm of major salivary gland, unspecified: Secondary | ICD-10-CM | POA: Diagnosis not present

## 2015-11-20 DIAGNOSIS — C029 Malignant neoplasm of tongue, unspecified: Secondary | ICD-10-CM | POA: Diagnosis not present

## 2015-11-22 DIAGNOSIS — Z23 Encounter for immunization: Secondary | ICD-10-CM | POA: Diagnosis not present

## 2015-11-22 DIAGNOSIS — I10 Essential (primary) hypertension: Secondary | ICD-10-CM | POA: Diagnosis not present

## 2015-11-22 DIAGNOSIS — C01 Malignant neoplasm of base of tongue: Secondary | ICD-10-CM | POA: Diagnosis not present

## 2015-11-22 DIAGNOSIS — Z87891 Personal history of nicotine dependence: Secondary | ICD-10-CM | POA: Diagnosis not present

## 2015-11-22 DIAGNOSIS — C760 Malignant neoplasm of head, face and neck: Secondary | ICD-10-CM | POA: Diagnosis not present

## 2015-11-29 DIAGNOSIS — C080 Malignant neoplasm of submandibular gland: Secondary | ICD-10-CM | POA: Diagnosis not present

## 2015-12-02 DIAGNOSIS — C760 Malignant neoplasm of head, face and neck: Secondary | ICD-10-CM | POA: Diagnosis not present

## 2015-12-04 DIAGNOSIS — I10 Essential (primary) hypertension: Secondary | ICD-10-CM | POA: Diagnosis not present

## 2015-12-04 DIAGNOSIS — C01 Malignant neoplasm of base of tongue: Secondary | ICD-10-CM | POA: Diagnosis not present

## 2015-12-04 DIAGNOSIS — C020 Malignant neoplasm of dorsal surface of tongue: Secondary | ICD-10-CM | POA: Diagnosis not present

## 2015-12-19 DIAGNOSIS — C01 Malignant neoplasm of base of tongue: Secondary | ICD-10-CM | POA: Diagnosis not present

## 2015-12-19 DIAGNOSIS — Z87891 Personal history of nicotine dependence: Secondary | ICD-10-CM | POA: Diagnosis not present

## 2015-12-19 DIAGNOSIS — K119 Disease of salivary gland, unspecified: Secondary | ICD-10-CM | POA: Diagnosis not present

## 2015-12-19 DIAGNOSIS — I10 Essential (primary) hypertension: Secondary | ICD-10-CM | POA: Diagnosis not present

## 2015-12-20 DIAGNOSIS — Z87891 Personal history of nicotine dependence: Secondary | ICD-10-CM | POA: Diagnosis not present

## 2015-12-20 DIAGNOSIS — I10 Essential (primary) hypertension: Secondary | ICD-10-CM | POA: Diagnosis not present

## 2015-12-20 DIAGNOSIS — C01 Malignant neoplasm of base of tongue: Secondary | ICD-10-CM | POA: Diagnosis not present

## 2016-01-03 DIAGNOSIS — C760 Malignant neoplasm of head, face and neck: Secondary | ICD-10-CM | POA: Diagnosis not present

## 2016-01-03 DIAGNOSIS — Z9889 Other specified postprocedural states: Secondary | ICD-10-CM | POA: Diagnosis not present

## 2016-01-29 DIAGNOSIS — C760 Malignant neoplasm of head, face and neck: Secondary | ICD-10-CM | POA: Diagnosis not present

## 2016-02-07 DIAGNOSIS — R1312 Dysphagia, oropharyngeal phase: Secondary | ICD-10-CM | POA: Diagnosis not present

## 2016-02-16 DIAGNOSIS — R1312 Dysphagia, oropharyngeal phase: Secondary | ICD-10-CM | POA: Diagnosis not present

## 2016-02-19 DIAGNOSIS — M5136 Other intervertebral disc degeneration, lumbar region: Secondary | ICD-10-CM | POA: Diagnosis not present

## 2016-02-19 DIAGNOSIS — G894 Chronic pain syndrome: Secondary | ICD-10-CM | POA: Diagnosis not present

## 2016-03-08 DIAGNOSIS — R1312 Dysphagia, oropharyngeal phase: Secondary | ICD-10-CM | POA: Diagnosis not present

## 2016-05-03 DIAGNOSIS — Z86010 Personal history of colonic polyps: Secondary | ICD-10-CM | POA: Diagnosis not present

## 2016-05-16 ENCOUNTER — Other Ambulatory Visit: Payer: Self-pay | Admitting: Family Medicine

## 2016-05-16 DIAGNOSIS — Z1231 Encounter for screening mammogram for malignant neoplasm of breast: Secondary | ICD-10-CM

## 2016-06-06 DIAGNOSIS — Z86010 Personal history of colonic polyps: Secondary | ICD-10-CM | POA: Diagnosis not present

## 2016-06-06 DIAGNOSIS — Z1211 Encounter for screening for malignant neoplasm of colon: Secondary | ICD-10-CM | POA: Diagnosis not present

## 2016-06-06 DIAGNOSIS — D126 Benign neoplasm of colon, unspecified: Secondary | ICD-10-CM | POA: Diagnosis not present

## 2016-06-06 DIAGNOSIS — K5730 Diverticulosis of large intestine without perforation or abscess without bleeding: Secondary | ICD-10-CM | POA: Diagnosis not present

## 2016-06-07 ENCOUNTER — Ambulatory Visit: Payer: Medicare Other

## 2016-06-11 ENCOUNTER — Ambulatory Visit
Admission: RE | Admit: 2016-06-11 | Discharge: 2016-06-11 | Disposition: A | Discharge status: Home / Self Care | Payer: Medicare Other | Source: Ambulatory Visit | Attending: Family Medicine | Admitting: Family Medicine

## 2016-06-11 DIAGNOSIS — Z1231 Encounter for screening mammogram for malignant neoplasm of breast: Secondary | ICD-10-CM

## 2016-06-11 DIAGNOSIS — D126 Benign neoplasm of colon, unspecified: Secondary | ICD-10-CM | POA: Diagnosis not present

## 2016-06-17 DIAGNOSIS — Z79891 Long term (current) use of opiate analgesic: Secondary | ICD-10-CM | POA: Diagnosis not present

## 2016-06-17 DIAGNOSIS — G894 Chronic pain syndrome: Secondary | ICD-10-CM | POA: Diagnosis not present

## 2016-06-17 DIAGNOSIS — M5136 Other intervertebral disc degeneration, lumbar region: Secondary | ICD-10-CM | POA: Diagnosis not present

## 2016-06-19 DIAGNOSIS — F419 Anxiety disorder, unspecified: Secondary | ICD-10-CM | POA: Diagnosis not present

## 2016-06-19 DIAGNOSIS — I10 Essential (primary) hypertension: Secondary | ICD-10-CM | POA: Diagnosis not present

## 2016-06-26 DIAGNOSIS — Z85810 Personal history of malignant neoplasm of tongue: Secondary | ICD-10-CM | POA: Diagnosis not present

## 2016-06-26 DIAGNOSIS — R1312 Dysphagia, oropharyngeal phase: Secondary | ICD-10-CM | POA: Diagnosis not present

## 2016-06-26 DIAGNOSIS — Z9181 History of falling: Secondary | ICD-10-CM | POA: Diagnosis not present

## 2016-06-26 DIAGNOSIS — C111 Malignant neoplasm of posterior wall of nasopharynx: Secondary | ICD-10-CM | POA: Diagnosis not present

## 2016-06-26 DIAGNOSIS — Z08 Encounter for follow-up examination after completed treatment for malignant neoplasm: Secondary | ICD-10-CM | POA: Diagnosis not present

## 2016-07-04 DIAGNOSIS — R1310 Dysphagia, unspecified: Secondary | ICD-10-CM | POA: Diagnosis not present

## 2016-07-04 DIAGNOSIS — R633 Feeding difficulties: Secondary | ICD-10-CM | POA: Diagnosis not present

## 2016-07-04 DIAGNOSIS — Z981 Arthrodesis status: Secondary | ICD-10-CM | POA: Diagnosis not present

## 2016-07-05 DIAGNOSIS — G479 Sleep disorder, unspecified: Secondary | ICD-10-CM | POA: Diagnosis not present

## 2016-07-05 DIAGNOSIS — F419 Anxiety disorder, unspecified: Secondary | ICD-10-CM | POA: Diagnosis not present

## 2016-07-05 DIAGNOSIS — I10 Essential (primary) hypertension: Secondary | ICD-10-CM | POA: Diagnosis not present

## 2016-07-05 DIAGNOSIS — Z0000 Encounter for general adult medical examination without abnormal findings: Secondary | ICD-10-CM | POA: Diagnosis not present

## 2016-07-05 DIAGNOSIS — E782 Mixed hyperlipidemia: Secondary | ICD-10-CM | POA: Diagnosis not present

## 2016-07-16 DIAGNOSIS — C44311 Basal cell carcinoma of skin of nose: Secondary | ICD-10-CM | POA: Diagnosis not present

## 2016-07-16 DIAGNOSIS — D2372 Other benign neoplasm of skin of left lower limb, including hip: Secondary | ICD-10-CM | POA: Diagnosis not present

## 2016-07-16 DIAGNOSIS — D1801 Hemangioma of skin and subcutaneous tissue: Secondary | ICD-10-CM | POA: Diagnosis not present

## 2016-07-16 DIAGNOSIS — D0462 Carcinoma in situ of skin of left upper limb, including shoulder: Secondary | ICD-10-CM | POA: Diagnosis not present

## 2016-07-16 DIAGNOSIS — L304 Erythema intertrigo: Secondary | ICD-10-CM | POA: Diagnosis not present

## 2016-07-16 DIAGNOSIS — D2272 Melanocytic nevi of left lower limb, including hip: Secondary | ICD-10-CM | POA: Diagnosis not present

## 2016-07-16 DIAGNOSIS — L821 Other seborrheic keratosis: Secondary | ICD-10-CM | POA: Diagnosis not present

## 2016-07-16 DIAGNOSIS — Z85828 Personal history of other malignant neoplasm of skin: Secondary | ICD-10-CM | POA: Diagnosis not present

## 2016-07-16 DIAGNOSIS — D225 Melanocytic nevi of trunk: Secondary | ICD-10-CM | POA: Diagnosis not present

## 2016-07-31 DIAGNOSIS — C44311 Basal cell carcinoma of skin of nose: Secondary | ICD-10-CM | POA: Diagnosis not present

## 2016-07-31 DIAGNOSIS — Z85828 Personal history of other malignant neoplasm of skin: Secondary | ICD-10-CM | POA: Diagnosis not present

## 2016-10-14 DIAGNOSIS — M5136 Other intervertebral disc degeneration, lumbar region: Secondary | ICD-10-CM | POA: Diagnosis not present

## 2016-10-14 DIAGNOSIS — G894 Chronic pain syndrome: Secondary | ICD-10-CM | POA: Diagnosis not present

## 2016-10-14 DIAGNOSIS — Z79891 Long term (current) use of opiate analgesic: Secondary | ICD-10-CM | POA: Diagnosis not present

## 2016-12-02 DIAGNOSIS — Z23 Encounter for immunization: Secondary | ICD-10-CM | POA: Diagnosis not present

## 2017-01-01 DIAGNOSIS — Z85810 Personal history of malignant neoplasm of tongue: Secondary | ICD-10-CM | POA: Diagnosis not present

## 2017-01-01 DIAGNOSIS — Z9889 Other specified postprocedural states: Secondary | ICD-10-CM | POA: Diagnosis not present

## 2017-01-01 DIAGNOSIS — Z85858 Personal history of malignant neoplasm of other endocrine glands: Secondary | ICD-10-CM | POA: Diagnosis not present

## 2017-01-01 DIAGNOSIS — L309 Dermatitis, unspecified: Secondary | ICD-10-CM | POA: Diagnosis not present

## 2017-01-01 DIAGNOSIS — Z08 Encounter for follow-up examination after completed treatment for malignant neoplasm: Secondary | ICD-10-CM | POA: Diagnosis not present

## 2017-01-14 DIAGNOSIS — G894 Chronic pain syndrome: Secondary | ICD-10-CM | POA: Diagnosis not present

## 2017-01-14 DIAGNOSIS — M5136 Other intervertebral disc degeneration, lumbar region: Secondary | ICD-10-CM | POA: Diagnosis not present

## 2017-01-14 DIAGNOSIS — Z79891 Long term (current) use of opiate analgesic: Secondary | ICD-10-CM | POA: Diagnosis not present

## 2017-02-16 ENCOUNTER — Emergency Department (HOSPITAL_COMMUNITY)
Admission: EM | Admit: 2017-02-16 | Discharge: 2017-02-16 | Disposition: A | Discharge status: Home / Self Care | Payer: Medicare Other | Attending: Emergency Medicine | Admitting: Emergency Medicine

## 2017-02-16 ENCOUNTER — Encounter (HOSPITAL_COMMUNITY): Payer: Self-pay | Admitting: Emergency Medicine

## 2017-02-16 ENCOUNTER — Other Ambulatory Visit: Payer: Self-pay

## 2017-02-16 ENCOUNTER — Encounter (HOSPITAL_COMMUNITY): Payer: Self-pay | Admitting: *Deleted

## 2017-02-16 ENCOUNTER — Emergency Department (HOSPITAL_COMMUNITY): Payer: Medicare Other

## 2017-02-16 ENCOUNTER — Ambulatory Visit (HOSPITAL_COMMUNITY)
Admission: EM | Admit: 2017-02-16 | Discharge: 2017-02-16 | Disposition: A | Discharge status: Home / Self Care | Payer: Medicare Other | Source: Home / Self Care

## 2017-02-16 DIAGNOSIS — S62610A Displaced fracture of proximal phalanx of right index finger, initial encounter for closed fracture: Secondary | ICD-10-CM | POA: Insufficient documentation

## 2017-02-16 DIAGNOSIS — Y939 Activity, unspecified: Secondary | ICD-10-CM | POA: Insufficient documentation

## 2017-02-16 DIAGNOSIS — Y999 Unspecified external cause status: Secondary | ICD-10-CM | POA: Insufficient documentation

## 2017-02-16 DIAGNOSIS — Y92009 Unspecified place in unspecified non-institutional (private) residence as the place of occurrence of the external cause: Secondary | ICD-10-CM | POA: Insufficient documentation

## 2017-02-16 DIAGNOSIS — Z880 Allergy status to penicillin: Secondary | ICD-10-CM | POA: Insufficient documentation

## 2017-02-16 DIAGNOSIS — M79641 Pain in right hand: Secondary | ICD-10-CM | POA: Diagnosis not present

## 2017-02-16 DIAGNOSIS — W010XXA Fall on same level from slipping, tripping and stumbling without subsequent striking against object, initial encounter: Secondary | ICD-10-CM | POA: Insufficient documentation

## 2017-02-16 DIAGNOSIS — R110 Nausea: Secondary | ICD-10-CM | POA: Insufficient documentation

## 2017-02-16 DIAGNOSIS — R55 Syncope and collapse: Secondary | ICD-10-CM | POA: Insufficient documentation

## 2017-02-16 DIAGNOSIS — R42 Dizziness and giddiness: Secondary | ICD-10-CM | POA: Diagnosis not present

## 2017-02-16 DIAGNOSIS — Z91030 Bee allergy status: Secondary | ICD-10-CM | POA: Diagnosis not present

## 2017-02-16 DIAGNOSIS — I10 Essential (primary) hypertension: Secondary | ICD-10-CM | POA: Insufficient documentation

## 2017-02-16 DIAGNOSIS — Z79899 Other long term (current) drug therapy: Secondary | ICD-10-CM | POA: Diagnosis not present

## 2017-02-16 LAB — URINALYSIS, ROUTINE W REFLEX MICROSCOPIC
Bilirubin Urine: NEGATIVE
Glucose, UA: NEGATIVE mg/dL
Hgb urine dipstick: NEGATIVE
Ketones, ur: NEGATIVE mg/dL
LEUKOCYTES UA: NEGATIVE
Nitrite: NEGATIVE
Protein, ur: NEGATIVE mg/dL
Specific Gravity, Urine: 1.01 (ref 1.005–1.030)
pH: 7 (ref 5.0–8.0)

## 2017-02-16 LAB — BASIC METABOLIC PANEL
ANION GAP: 9 (ref 5–15)
BUN: 11 mg/dL (ref 6–20)
CALCIUM: 9.3 mg/dL (ref 8.9–10.3)
CHLORIDE: 103 mmol/L (ref 101–111)
CO2: 24 mmol/L (ref 22–32)
Creatinine, Ser: 0.69 mg/dL (ref 0.44–1.00)
GFR calc non Af Amer: >60 mL/min (ref >60)
Glucose, Bld: 106 mg/dL — ABNORMAL HIGH (ref 65–99)
Potassium: 3.5 mmol/L (ref 3.5–5.1)
Sodium: 136 mmol/L (ref 135–145)

## 2017-02-16 LAB — CBC
HCT: 40.8 % (ref 36.0–46.0)
HEMOGLOBIN: 13.4 g/dL (ref 12.0–15.0)
MCH: 30.2 pg (ref 26.0–34.0)
MCHC: 32.8 g/dL (ref 30.0–36.0)
MCV: 92.1 fL (ref 78.0–100.0)
Platelets: 275 10*3/uL (ref 150–400)
RBC: 4.43 MIL/uL (ref 3.87–5.11)
RDW: 14 % (ref 11.5–15.5)
WBC: 9.1 10*3/uL (ref 4.0–10.5)

## 2017-02-16 LAB — I-STAT TROPONIN, ED: TROPONIN I, POC: 0 ng/mL (ref 0.00–0.08)

## 2017-02-16 MED ORDER — ONDANSETRON 4 MG PO TBDP
4.0000 mg | ORAL_TABLET | Freq: Once | ORAL | Status: AC
  Administered 2017-02-16: 4 mg via ORAL
  Filled 2017-02-16: qty 1

## 2017-02-16 MED ORDER — OXYCODONE-ACETAMINOPHEN 5-325 MG PO TABS
1.0000 | ORAL_TABLET | Freq: Once | ORAL | Status: AC
  Administered 2017-02-16: 1 via ORAL
  Filled 2017-02-16: qty 1

## 2017-02-16 MED ORDER — OXYCODONE HCL 5 MG PO TABS
5.0000 mg | ORAL_TABLET | Freq: Once | ORAL | Status: AC
  Administered 2017-02-16: 5 mg via ORAL
  Filled 2017-02-16: qty 1

## 2017-02-16 NOTE — ED Triage Notes (Addendum)
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 Per pt her husband "broke the fall" and she did not hit the floor. Per pt she have a hard time drinking water, per pt her right hand hurts,

## 2017-02-16 NOTE — ED Notes (Signed)
EDP at bedside  

## 2017-02-16 NOTE — Progress Notes (Signed)
Orthopedic Tech Progress Note Patient Details:  Brooke Weber 04-Jan-1941 [id number]  Ortho Devices Type of Ortho Device: Buddy tape, Ace wrap, Short arm splint Ortho Device/Splint Interventions: Application   Post Interventions Patient Tolerated: Well Instructions Provided: Care of device   [name] 02/16/2017, 6:52 PM

## 2017-02-16 NOTE — ED Provider Notes (Signed)
[facility] EMERGENCY DEPARTMENT Provider Note   CSN: [id number] Arrival date & time: 02/16/17  1533     History   Chief Complaint Chief Complaint  Patient presents with  . Loss of Consciousness  . Finger Injury    HPI Brooke Weber is a 76 y.o. female.  HPI  76 year old female with a history of hypertension and arthritis presents with syncope and finger/hand injury.  Yesterday morning she woke up and was eating breakfast when she started to feel nauseated.  This progressed and she felt more more lightheaded over the course of a couple minutes.  She tried to stand up to get her husband to help but she started to fall and passed out.  Her husband caught her and she was lowered to the ground.  However she did hurt her hand on the table causing swelling and ecchymosis.  No numbness but the pain is about a 7 or 8 out of 10.  Since then she felt nauseated and has been nauseated but no further dizziness, lightheadedness or syncope.  At no point has she had headache, chest pain, shortness of breath, palpitations.  No leg swelling.  No recent illness such as having vomiting or diarrhea.  She typically sees [facility] for multiple orthopedic issues and is chronically on oxycodone 10/325.  She states she has had multiple syncopal episodes over the course of years starting from when she was a kid but no clear etiology.  She denies any known heart problems.  Last syncopal episode was a couple years ago where she saw her PCP.  She does not think she is ever had an echocardiogram.  Past Medical History:  Diagnosis Date  . Arthritis   . Hypertension     There are no active problems to display for this patient.   No past surgical history on file.  OB History    No data available       Home Medications    Prior to Admission medications   Medication Sig Start Date End Date Taking? Authorizing Provider  ALPRAZolam Duanne Moron) 0.5 MG tablet Take 0.25 mg by mouth  daily as needed.    Yes [provider]  atenolol (TENORMIN) 100 MG tablet Take 100 mg by mouth at bedtime. 01/15/14  Yes [provider]  augmented betamethasone dipropionate (DIPROLENE-AF) 0.05 % cream Apply 1 application topically daily as needed. 01/01/17  Yes [provider]  lisinopril-hydrochlorothiazide (PRINZIDE,ZESTORETIC) 20-12.5 MG per tablet Take 1 tablet by mouth daily. 01/15/14  Yes [provider]  omeprazole (PRILOSEC) 20 MG capsule Take 20 mg by mouth daily.   Yes [provider]  oxyCODONE-acetaminophen (PERCOCET) 10-325 MG per tablet Take 1 tablet by mouth 3 (three) times daily. 02/18/14  Yes [provider]  Polyethyl Glycol-Propyl Glycol (SYSTANE) 0.4-0.3 % SOLN Place 2 drops into both eyes 3 (three) times daily.   Yes [provider]  polyethylene glycol (MIRALAX / GLYCOLAX) packet Take 17 g by mouth daily.   Yes [provider]  PROCTOSOL HC 2.5 % rectal cream Place 1 application rectally daily as needed. 12/10/16  Yes [provider]    Family History Family History  Problem Relation Age of Onset  . Breast cancer Sister 28  . Breast cancer Maternal Aunt 60    Social History Social History   Tobacco Use  . Smoking status: Never Smoker  . Smokeless tobacco: Never Used  Substance Use Topics  . Alcohol use: No  . Drug  use: No     Allergies   Iodine; Other; Penicillins; Aspirin; and Bee venom   Review of Systems Review of Systems  Constitutional: Negative for fever.  Respiratory: Negative for shortness of breath.   Cardiovascular: Negative for chest pain, palpitations and leg swelling.  Gastrointestinal: Positive for nausea. Negative for abdominal pain and vomiting.  Musculoskeletal: Positive for arthralgias and joint swelling.  Skin: Negative for wound.  Neurological: Positive for syncope and light-headedness. Negative for weakness, numbness and headaches.  All other systems  reviewed and are negative.    Physical Exam Updated Vital Signs BP (!) 153/68   Pulse 60   Temp 98.2 F (36.8 C) (Oral)   Resp 18   SpO2 98%   Physical Exam  Constitutional: She is oriented to person, place, and time. She appears well-developed and well-nourished.  HENT:  Head: Normocephalic and atraumatic.  Right Ear: External ear normal.  Left Ear: External ear normal.  Nose: Nose normal.  Mouth/Throat: Oropharynx is clear and moist.  Eyes: EOM are normal. Pupils are equal, round, and reactive to light. Right eye exhibits no discharge. Left eye exhibits no discharge.  Neck: Neck supple.  Cardiovascular: Normal rate, regular rhythm and normal heart sounds.  No murmur heard. Pulmonary/Chest: Effort normal and breath sounds normal.  Abdominal: Soft. She exhibits no distension. There is no tenderness.  Musculoskeletal:       Right hand: She exhibits decreased range of motion, tenderness and swelling. She exhibits no deformity. Normal sensation noted.       Hands: Neurological: She is alert and oriented to person, place, and time.  CN 3-12 grossly intact. 5/5 strength in all 4 extremities. Grossly normal sensation. Normal finger to nose.   Skin: Skin is warm and dry.  Nursing note and vitals reviewed.    ED Treatments / Results  Labs (all labs ordered are listed, but only abnormal results are displayed) Labs Reviewed  BASIC METABOLIC PANEL - Abnormal; Notable for the following components:      Result Value   Glucose, Bld 106 (*)    All other components within normal limits  CBC  URINALYSIS, ROUTINE W REFLEX MICROSCOPIC  I-STAT TROPONIN, ED    EKG  EKG Interpretation  Date/Time:  Sunday February 16 2017 16:58:48 EST Ventricular Rate:  61 PR Interval:    QRS Duration: 105 QT Interval:  447 QTC Calculation: 451 R Axis:   50 Text Interpretation:  Normal sinus rhythm Artifact otherwise no significant change since 2005 Confirmed by [facility] Gambler [phone]) on  02/16/2017 5:21:54 PM       Radiology Dg Hand Complete Right  Result Date: 02/16/2017 CLINICAL DATA:  Syncope.  Fall.  Right hand pain EXAM: RIGHT HAND - COMPLETE 3+ VIEW COMPARISON:  None. FINDINGS: Fracture noted through the proximal phalanx of the right index finger, comminuted and mildly displaced. No subluxation or dislocation. Mild osteoarthritic changes throughout the IP joints and first carpometacarpal joint. Cystic change in the scaphoid. IMPRESSION: Mildly comminuted and displaced fracture through the proximal phalanx right index finger. Electronically Signed   By: Rolm Baptise M.D.   On: 02/16/2017 16:36    Procedures Procedures (including critical care time)  Medications Ordered in ED Medications  oxyCODONE-acetaminophen (PERCOCET/ROXICET) 5-325 MG per tablet 1 tablet (1 tablet Oral Given 02/16/17 1740)    And  oxyCODONE (Oxy IR/ROXICODONE) immediate release tablet 5 mg (5 mg Oral Given 02/16/17 1740)  ondansetron (ZOFRAN-ODT) disintegrating tablet 4 mg (4 mg Oral Given 02/16/17 1740)  Initial Impression / Assessment and Plan / ED Course  I have reviewed the triage vital signs and the nursing notes.  Pertinent labs & imaging results that were available during my care of the patient were reviewed by me and considered in my medical decision making (see chart for details).     Patient feels well.  Nausea has resolved.  She was given her home dose of oxycodone and pain is better.  She denies any concerning symptoms such as chest pain or shortness of breath, palpitations, headache, or strokelike symptoms.  Unclear etiology of her syncope.  I did discuss based on her increased age she is at somewhat increased risk of arrhythmias.  However she does not want to stay in the ED or be admitted.  I discussed concern for possible life-threatening arrhythmias and she understands but would like to follow-up closely with PCP.  As for her finger there is some mild displacement and  comminuted fracture but it is overall in relatively good alignment and does not appear to need emergent reduction.  She is neurovascular intact.  Discussed with Dr. Veverly Fells as she is very connected with [facility] orthopedics.  He recommends buddy taping the fingers as well as splint for extra protection if needed.  Follow-up with Dr. Amedeo Plenty or [facility] tomorrow morning or if they are unable to see him in the office then he will see her in office.  Discharge home with return precautions.  Final Clinical Impressions(s) / ED Diagnoses   Final diagnoses:  Syncope, unspecified syncope type  Closed displaced fracture of proximal phalanx of right index finger, initial encounter    ED Discharge Orders    None       [facility] Gambler, MD 02/16/17 Vernelle Emerald

## 2017-02-16 NOTE — ED Triage Notes (Addendum)
Pt states she had a syncopal episode last night, felt nauseated and passed out and fell. Not on blood thinners. Husband helped break the fall, did not hit head or neck. Pt has pain to right middle finger and hand. Some queasiness at present.

## 2017-02-16 NOTE — ED Notes (Signed)
Spoke to providers about patient.  Per Camila Li, PA, instructed patient we would be glad to see her here for her hand.  However we would be sending her to the ed for evaluation of syncope/loss of consciousness.  Patient reports falling yesterday .  Patient was sitting at breakfast table eating.  Patient felt hot and dizzy, went to stand and that's when she passed out.  Husband with patient .  Patient said she hurt her hand in fall.  Patient describes many syncopal episodes since childhood, but no specific diagnosis.  Patient is alert and oriented X 3.  Patient mae x 4 .  Patient denies chest pain.  Skin warm and dry.  Patient has a dressing on hand placed at home

## 2017-02-17 DIAGNOSIS — S62610A Displaced fracture of proximal phalanx of right index finger, initial encounter for closed fracture: Secondary | ICD-10-CM | POA: Diagnosis not present

## 2017-02-17 DIAGNOSIS — M79644 Pain in right finger(s): Secondary | ICD-10-CM | POA: Diagnosis not present

## 2017-02-18 ENCOUNTER — Encounter (HOSPITAL_COMMUNITY)
Admission: RE | Disposition: A | Discharge status: Home / Self Care | Payer: Self-pay | Source: Ambulatory Visit | Attending: Orthopedic Surgery

## 2017-02-18 ENCOUNTER — Ambulatory Visit (HOSPITAL_COMMUNITY)
Admission: RE | Admit: 2017-02-18 | Discharge: 2017-02-18 | Disposition: A | Discharge status: Home / Self Care | Payer: Medicare Other | Source: Ambulatory Visit | Attending: Orthopedic Surgery | Admitting: Orthopedic Surgery

## 2017-02-18 ENCOUNTER — Encounter (HOSPITAL_COMMUNITY): Payer: Self-pay | Admitting: Orthopedic Surgery

## 2017-02-18 ENCOUNTER — Ambulatory Visit (HOSPITAL_COMMUNITY): Payer: Medicare Other | Admitting: Anesthesiology

## 2017-02-18 DIAGNOSIS — Z79899 Other long term (current) drug therapy: Secondary | ICD-10-CM | POA: Insufficient documentation

## 2017-02-18 DIAGNOSIS — X58XXXA Exposure to other specified factors, initial encounter: Secondary | ICD-10-CM | POA: Insufficient documentation

## 2017-02-18 DIAGNOSIS — I10 Essential (primary) hypertension: Secondary | ICD-10-CM | POA: Diagnosis not present

## 2017-02-18 DIAGNOSIS — Y929 Unspecified place or not applicable: Secondary | ICD-10-CM | POA: Diagnosis not present

## 2017-02-18 DIAGNOSIS — S62610A Displaced fracture of proximal phalanx of right index finger, initial encounter for closed fracture: Secondary | ICD-10-CM | POA: Insufficient documentation

## 2017-02-18 DIAGNOSIS — M79644 Pain in right finger(s): Secondary | ICD-10-CM | POA: Diagnosis not present

## 2017-02-18 DIAGNOSIS — M1990 Unspecified osteoarthritis, unspecified site: Secondary | ICD-10-CM | POA: Diagnosis not present

## 2017-02-18 DIAGNOSIS — Z880 Allergy status to penicillin: Secondary | ICD-10-CM | POA: Insufficient documentation

## 2017-02-18 HISTORY — PX: OPEN REDUCTION INTERNAL FIXATION (ORIF) PROXIMAL PHALANX: SHX6235

## 2017-02-18 LAB — SURGICAL PCR SCREEN
MRSA, PCR: NEGATIVE
Staphylococcus aureus: NEGATIVE

## 2017-02-18 SURGERY — OPEN REDUCTION INTERNAL FIXATION (ORIF) PROXIMAL PHALANX
Anesthesia: General | Site: Finger | Laterality: Right

## 2017-02-18 MED ORDER — CHLORHEXIDINE GLUCONATE 4 % EX LIQD: 60.0000 mL | Freq: Once | CUTANEOUS | Status: DC

## 2017-02-18 MED ORDER — 0.9 % SODIUM CHLORIDE (POUR BTL) OPTIME
TOPICAL | Status: DC | PRN
  Administered 2017-02-18: 1000 mL

## 2017-02-18 MED ORDER — VANCOMYCIN HCL IN DEXTROSE 1-5 GM/200ML-% IV SOLN
1000.0000 mg | INTRAVENOUS | Status: AC
  Administered 2017-02-18: 1000 mg via INTRAVENOUS
  Filled 2017-02-18: qty 200

## 2017-02-18 MED ORDER — PROMETHAZINE HCL 25 MG/ML IJ SOLN: 6.2500 mg | INTRAMUSCULAR | Status: DC | PRN

## 2017-02-18 MED ORDER — LACTATED RINGERS IV SOLN
INTRAVENOUS | Status: DC | PRN
  Administered 2017-02-18: 15:00:00 via INTRAVENOUS

## 2017-02-18 MED ORDER — OXYCODONE HCL 5 MG/5ML PO SOLN: 5.0000 mg | Freq: Once | ORAL | Status: DC | PRN

## 2017-02-18 MED ORDER — HYDROMORPHONE HCL 1 MG/ML IJ SOLN: 0.2500 mg | INTRAMUSCULAR | Status: DC | PRN

## 2017-02-18 MED ORDER — BUPIVACAINE HCL (PF) 0.25 % IJ SOLN
INTRAMUSCULAR | Status: DC | PRN
  Administered 2017-02-18: 6 mL

## 2017-02-18 MED ORDER — MEPERIDINE HCL 25 MG/ML IJ SOLN: 6.2500 mg | INTRAMUSCULAR | Status: DC | PRN

## 2017-02-18 MED ORDER — PROPOFOL 10 MG/ML IV BOLUS
INTRAVENOUS | Status: DC | PRN
  Administered 2017-02-18: 200 mg via INTRAVENOUS

## 2017-02-18 MED ORDER — OXYCODONE HCL 5 MG PO TABS: 5.0000 mg | ORAL_TABLET | Freq: Once | ORAL | Status: DC | PRN

## 2017-02-18 MED ORDER — BACITRACIN ZINC 500 UNIT/GM EX OINT
TOPICAL_OINTMENT | CUTANEOUS | Status: DC | PRN
  Administered 2017-02-18: 1 via TOPICAL

## 2017-02-18 MED ORDER — BACITRACIN-NEOMYCIN-POLYMYXIN 400-5-5000 EX OINT
TOPICAL_OINTMENT | CUTANEOUS | Status: AC
  Filled 2017-02-18: qty 1

## 2017-02-18 MED ORDER — ONDANSETRON HCL 4 MG/2ML IJ SOLN
INTRAMUSCULAR | Status: DC | PRN
  Administered 2017-02-18: 4 mg via INTRAVENOUS

## 2017-02-18 MED ORDER — FENTANYL CITRATE (PF) 250 MCG/5ML IJ SOLN
INTRAMUSCULAR | Status: AC
  Filled 2017-02-18: qty 5

## 2017-02-18 MED ORDER — MIDAZOLAM HCL 2 MG/2ML IJ SOLN
INTRAMUSCULAR | Status: AC
  Filled 2017-02-18: qty 2

## 2017-02-18 MED ORDER — PROPOFOL 10 MG/ML IV BOLUS
INTRAVENOUS | Status: AC
  Filled 2017-02-18: qty 20

## 2017-02-18 MED ORDER — LACTATED RINGERS IV SOLN
INTRAVENOUS | Status: DC
Start: 2017-02-18 — End: 2017-02-18
  Administered 2017-02-18: 15:00:00 via INTRAVENOUS

## 2017-02-18 MED ORDER — LIDOCAINE HCL (CARDIAC) 20 MG/ML IV SOLN
INTRAVENOUS | Status: DC | PRN
  Administered 2017-02-18: 30 mg via INTRAVENOUS

## 2017-02-18 MED ORDER — FENTANYL CITRATE (PF) 100 MCG/2ML IJ SOLN
INTRAMUSCULAR | Status: DC | PRN
  Administered 2017-02-18: 25 ug via INTRAVENOUS
  Administered 2017-02-18: 50 ug via INTRAVENOUS
  Administered 2017-02-18: 25 ug via INTRAVENOUS

## 2017-02-18 MED ORDER — DEXAMETHASONE SODIUM PHOSPHATE 4 MG/ML IJ SOLN
INTRAMUSCULAR | Status: DC | PRN
  Administered 2017-02-18: 10 mg via INTRAVENOUS

## 2017-02-18 MED ORDER — BUPIVACAINE HCL (PF) 0.25 % IJ SOLN
INTRAMUSCULAR | Status: AC
  Filled 2017-02-18: qty 30

## 2017-02-18 SURGICAL SUPPLY — 52 items
BANDAGE ACE 3X5.8 VEL STRL LF (GAUZE/BANDAGES/DRESSINGS) ×5 IMPLANT
BANDAGE ACE 4X5 VEL STRL LF (GAUZE/BANDAGES/DRESSINGS) ×3 IMPLANT
BLADE CLIPPER SURG (BLADE) IMPLANT
BNDG CMPR 9X4 STRL LF SNTH (GAUZE/BANDAGES/DRESSINGS) ×1
BNDG ESMARK 4X9 LF (GAUZE/BANDAGES/DRESSINGS) ×3 IMPLANT
BNDG GAUZE ELAST 4 BULKY (GAUZE/BANDAGES/DRESSINGS) ×5 IMPLANT
CORDS BIPOLAR (ELECTRODE) ×3 IMPLANT
COVER SURGICAL LIGHT HANDLE (MISCELLANEOUS) ×3 IMPLANT
CUFF TOURNIQUET SINGLE 18IN (TOURNIQUET CUFF) ×3 IMPLANT
CUFF TOURNIQUET SINGLE 24IN (TOURNIQUET CUFF) IMPLANT
DRAIN TLS ROUND 10FR (DRAIN) IMPLANT
DRAPE OEC MINIVIEW 54X84 (DRAPES) ×2 IMPLANT
DRAPE SURG 17X23 STRL (DRAPES) ×3 IMPLANT
DRSG EMULSION OIL 3X3 NADH (GAUZE/BANDAGES/DRESSINGS) ×2 IMPLANT
GAUZE SPONGE 4X4 12PLY STRL (GAUZE/BANDAGES/DRESSINGS) ×3 IMPLANT
GAUZE SPONGE 4X4 12PLY STRL LF (GAUZE/BANDAGES/DRESSINGS) ×2 IMPLANT
GAUZE XEROFORM 1X8 LF (GAUZE/BANDAGES/DRESSINGS) ×3 IMPLANT
GAUZE XEROFORM 5X9 LF (GAUZE/BANDAGES/DRESSINGS) ×2 IMPLANT
GLOVE BIOGEL M 8.0 STRL (GLOVE) ×3 IMPLANT
GLOVE SS BIOGEL STRL SZ 8 (GLOVE) ×1 IMPLANT
GLOVE SUPERSENSE BIOGEL SZ 8 (GLOVE) ×2
GOWN STRL REUS W/ TWL LRG LVL3 (GOWN DISPOSABLE) ×3 IMPLANT
GOWN STRL REUS W/ TWL XL LVL3 (GOWN DISPOSABLE) ×3 IMPLANT
GOWN STRL REUS W/TWL LRG LVL3 (GOWN DISPOSABLE) ×3
GOWN STRL REUS W/TWL XL LVL3 (GOWN DISPOSABLE) ×3
K-wire (Wire) ×4 IMPLANT
KIT BASIN OR (CUSTOM PROCEDURE TRAY) ×3 IMPLANT
KIT ROOM TURNOVER OR (KITS) ×3 IMPLANT
MANIFOLD NEPTUNE II (INSTRUMENTS) ×1 IMPLANT
NEEDLE 22X1 1/2 (OR ONLY) (NEEDLE) ×2 IMPLANT
NS IRRIG 1000ML POUR BTL (IV SOLUTION) ×3 IMPLANT
PACK ORTHO EXTREMITY (CUSTOM PROCEDURE TRAY) ×3 IMPLANT
PAD ARMBOARD 7.5X6 YLW CONV (MISCELLANEOUS) ×4 IMPLANT
PAD CAST 3X4 CTTN HI CHSV (CAST SUPPLIES) ×1 IMPLANT
PAD CAST 4YDX4 CTTN HI CHSV (CAST SUPPLIES) ×1 IMPLANT
PADDING CAST COTTON 3X4 STRL (CAST SUPPLIES) ×3
PADDING CAST COTTON 4X4 STRL (CAST SUPPLIES) ×3
SCRUB BETADINE 4OZ XXX (MISCELLANEOUS) ×3 IMPLANT
SOL PREP POV-IOD 4OZ 10% (MISCELLANEOUS) ×1 IMPLANT
SPLINT FIBERGLASS 3X35 (CAST SUPPLIES) ×2 IMPLANT
SPONGE LAP 4X18 X RAY DECT (DISPOSABLE) IMPLANT
SUT MNCRL AB 4-0 PS2 18 (SUTURE) ×3 IMPLANT
SUT PROLENE 3 0 PS 2 (SUTURE) IMPLANT
SUT VIC AB 3-0 FS2 27 (SUTURE) IMPLANT
SYR CONTROL 10ML LL (SYRINGE) ×2 IMPLANT
SYSTEM CHEST DRAIN TLS 7FR (DRAIN) IMPLANT
TOWEL OR 17X24 6PK STRL BLUE (TOWEL DISPOSABLE) ×3 IMPLANT
TOWEL OR 17X26 10 PK STRL BLUE (TOWEL DISPOSABLE) ×1 IMPLANT
TUBE CONNECTING 12'X1/4 (SUCTIONS)
TUBE CONNECTING 12X1/4 (SUCTIONS) ×1 IMPLANT
TUBE EVACUATION TLS (MISCELLANEOUS) ×1 IMPLANT
WATER STERILE IRR 1000ML POUR (IV SOLUTION) ×1 IMPLANT

## 2017-02-18 NOTE — Discharge Instructions (Signed)

## 2017-02-18 NOTE — Transfer of Care (Signed)
Immediate Anesthesia Transfer of Care Note  Patient: Brooke Weber  Procedure(s) Performed: CLOSED REDUCTION PINNING RIGHT INDEX PROXIMAL PHALANX FRACTURE (Right Finger)  Patient Location: PACU  Anesthesia Type:General  Level of Consciousness: awake, alert  and oriented  Airway & Oxygen Therapy: Patient Spontanous Breathing  Post-op Assessment: Report given to RN and Post -op Vital signs reviewed and stable  Post vital signs: Reviewed and stable  Last Vitals:  Vitals:   02/18/17 1407 02/18/17 1909  BP: (!) 146/60 (!) 145/66  Pulse: 66 76  Resp: 18 15  Temp: 36.6 C 36.8 C  SpO2: 100% 100%    Last Pain:  Vitals:   02/18/17 1909  TempSrc:   PainSc: (P) 0-No pain         Complications: No apparent anesthesia complications

## 2017-02-18 NOTE — Op Note (Signed)
See [id number] Kristan Brummitt MD

## 2017-02-18 NOTE — Anesthesia Preprocedure Evaluation (Signed)
Anesthesia Evaluation  Patient identified by MRN, date of birth, ID band Patient awake    Reviewed: Allergy & Precautions, NPO status , Patient's Chart, lab work & pertinent test results, reviewed documented beta blocker date and time   Airway Mallampati: II  TM Distance: >3 FB Neck ROM: Full    Dental no notable dental hx.    Pulmonary neg pulmonary ROS,    Pulmonary exam normal breath sounds clear to auscultation       Cardiovascular hypertension, Pt. on medications and Pt. on home beta blockers negative cardio ROS Normal cardiovascular exam Rhythm:Regular Rate:Normal     Neuro/Psych negative neurological ROS  negative psych ROS   GI/Hepatic negative GI ROS, Neg liver ROS,   Endo/Other  negative endocrine ROS  Renal/GU negative Renal ROS  negative genitourinary   Musculoskeletal negative musculoskeletal ROS (+) Arthritis , Osteoarthritis,    Abdominal   Peds negative pediatric ROS (+)  Hematology negative hematology ROS (+)   Anesthesia Other Findings   Reproductive/Obstetrics negative OB ROS                             Anesthesia Physical Anesthesia Plan  ASA: II  Anesthesia Plan: General   Post-op Pain Management:    Induction: Intravenous  PONV Risk Score and Plan: 3 and Ondansetron, Dexamethasone and Midazolam  Airway Management Planned: LMA  Additional Equipment:   Intra-op Plan:   Post-operative Plan: Extubation in OR  Informed Consent: I have reviewed the patients History and Physical, chart, labs and discussed the procedure including the risks, benefits and alternatives for the proposed anesthesia with the patient or authorized representative who has indicated his/her understanding and acceptance.   Dental advisory given  Plan Discussed with: CRNA  Anesthesia Plan Comments:         Anesthesia Quick Evaluation

## 2017-02-18 NOTE — H&P (Signed)
Brooke Weber is an 76 y.o. female.   Chief Complaint: right index finger fracture HPI: the patient is a pleasant 76 year female whose previous been seen and evaluated in our office setting. Unfortunately she has a displaced right index finger proximal phalanx fracture. We discussed with her our concerns and given the instability of the fracture parameters we recommend proceeding with surgical intervention. We presently discussed risks and benefits. She desires to proceed.  Past Medical History:  Diagnosis Date  . Arthritis   . Hypertension     Past Surgical History:  Procedure Laterality Date  . ABDOMINAL HYSTERECTOMY  1978   Partial  . BACK SURGERY  2003   cervial fusion four discs  . CHOLECYSTECTOMY  2008  . JOINT REPLACEMENT Right 1996  . SHOULDER SURGERY  2008   Pinning    Family History  Problem Relation Age of Onset  . Breast cancer Sister 90  . Breast cancer Maternal Aunt 60   Social History:  reports that  has never smoked. she has never used smokeless tobacco. She reports that she does not drink alcohol or use drugs.  Allergies:  Allergies  Allergen Reactions  . Aspirin Anaphylaxis  . Bee Venom Anaphylaxis  . Iodine Anaphylaxis  . Other Anaphylaxis    nuts  . Penicillins Anaphylaxis    Has patient had a PCN reaction causing immediate rash, facial/tongue/throat swelling, SOB or lightheadedness with hypotension: No Has patient had a PCN reaction causing severe rash involving mucus membranes or skin necrosis: No Has patient had a PCN reaction that required hospitalization: No Has patient had a PCN reaction occurring within the last 10 years: No If all of the above answers are "NO", then may proceed with Cephalosporin use.    Medications Prior to Admission  Medication Sig Dispense Refill  . ALPRAZolam (XANAX) 0.5 MG tablet Take 0.25 mg by mouth daily as needed for anxiety.     Marland Kitchen atenolol (TENORMIN) 100 MG tablet Take 100 mg by mouth at bedtime.  2  .  augmented betamethasone dipropionate (DIPROLENE-AF) 0.05 % cream Apply 1 application topically daily as needed (eczema).   2  . lisinopril-hydrochlorothiazide (PRINZIDE,ZESTORETIC) 20-12.5 MG per tablet Take 1 tablet by mouth daily.  2  . omeprazole (PRILOSEC) 20 MG capsule Take 20 mg by mouth daily.    Marland Kitchen oxyCODONE-acetaminophen (PERCOCET) 10-325 MG per tablet Take 1 tablet by mouth every 6 (six) hours as needed for pain.     Vladimir Faster Glycol-Propyl Glycol (SYSTANE) 0.4-0.3 % SOLN Place 2 drops into both eyes 3 (three) times daily.    . polyethylene glycol (MIRALAX / GLYCOLAX) packet Take 17 g by mouth daily.    Marland Kitchen PROCTOSOL HC 2.5 % rectal cream Place 1 application rectally daily as needed for anal itching.   0    No results found for this or any previous visit (from the past 48 hour(s)). No results found.  Review of Systems  Constitutional: Negative.   HENT: Negative.   Eyes: Negative.   Gastrointestinal: Negative.   Musculoskeletal:       See history of present illness  Skin: Negative.   Endo/Heme/Allergies: Negative.     Blood pressure (!) 146/60, pulse 66, temperature 97.8 F (36.6 C), temperature source Oral, resp. rate 18, height 5\' 5"  (1.651 m), weight 79.4 kg (175 lb), SpO2 100 %. Physical Exam  The patient is alert and oriented in no acute distress. The patient complains of pain in the affected upper extremity.  The  patient is noted to have a normal HEENT exam. Lung fields show equal chest expansion and no shortness of breath. Abdomen exam is nontender without distention. Lower extremity examination does not show any fracture dislocation or blood clot symptoms. Pelvis is stable and the neck and back are stable and nontender. Examination of the right index finger shows that she's pursue been placed into a splint her sensation refill is intact. She was noted at the time of her interoffice examination to have mild swelling and ecchymosis about the finger. Neurovascular she  was intact.  Assessment/Plan Right index finger proximal phalanx fracture  We are planning surgery for your upper extremity. The risk and benefits of surgery to include risk of bleeding, infection, anesthesia,  damage to normal structures and failure of the surgery to accomplish its intended goals of relieving symptoms and restoring function have been discussed in detail. With this in mind we plan to proceed. I have specifically discussed with the patient the pre-and postoperative regime and the dos and don'ts and risk and benefits in great detail. Risk and benefits of surgery also include risk of dystrophy(CRPS), chronic nerve pain, failure of the healing process to go onto completion and other inherent risks of surgery The relavent the pathophysiology of the disease/injury process, as well as the alternatives for treatment and postoperative course of action has been discussed in great detail with the patient who desires to proceed.  We will do everything in our power to help you (the patient) restore function to the upper extremity. It is a pleasure to see this patient today.   Eron Staat L, PA-C 02/18/2017, 5:44 PM

## 2017-02-18 NOTE — Op Note (Signed)
Brooke Weber, Brooke NO.:  [id number]  MEDICAL RECORD NO.:  [id number]  LOCATION:  PERIO                        FACILITY:  [facility]  PHYSICIAN:  Satira Anis. Locke Barrell, M.D.DATE OF BIRTH:  Apr 08, 1940  DATE OF PROCEDURE:  02/18/2017 DATE OF DISCHARGE:                              OPERATIVE REPORT   PREOPERATIVE DIAGNOSIS:  Right index finger proximal phalanx fracture displaced.  POSTOPERATIVE DIAGNOSIS:  Right index finger proximal phalanx fracture displaced.  PROCEDURES: 1. Closed reduction and pinning of proximal phalanx fracture, right     index finger. 2. 4-view radiographic series, right index finger.  SURGEON:  Satira Anis. Amedeo Plenty, MD.  ASSISTANT:  Avelina Laine, PA-C.  COMPLICATIONS:  None.  ANESTHESIA:  General.  TOURNIQUET TIME:  Zero.  INDICATIONS:  A 76 year old female with the above-mentioned diagnosis. She understands risks and benefits of surgery and desires to proceed.  DESCRIPTION OF PROCEDURE:  The patient was seen by myself and Anesthesia, taken to the operative theater, underwent smooth induction of general anesthesia.  She was prepped with Hibiclens scrub due to IODINE allergy and had vancomycin placed preoperatively.  Following this, the patient had sterile field secured.  Final time-out was observed and the patient underwent closed reduction followed by pinning with an Eaton-Belsky technique.  The patient had excellent reduction and the patient had excellent splay to the fingers.  I did perform a gentle manipulation of the PIP and DIP joints, which had some significant arthritic change, but had relatively stable range of motion without instability.  The pins were capped and treated with Xeroform followed by Neosporin to the skin and a very careful dressing applied in the form of a splint.  She tolerated the procedure well.  She was woken and will be discharged home.  She will see [place] back in the office in approximately 10-14  days with Therapy immediately following.  Do's and don'ts have been discussed and all questions have been encouraged and answered and addressed.     Satira Anis. Amedeo Plenty, M.D.     [facility]  D:  02/18/2017  T:  02/18/2017  Job:  [id number]

## 2017-02-20 ENCOUNTER — Encounter (HOSPITAL_COMMUNITY): Payer: Self-pay | Admitting: Orthopedic Surgery

## 2017-02-20 NOTE — Anesthesia Postprocedure Evaluation (Signed)
Anesthesia Post Note  Patient: Brooke Weber  Procedure(s) Performed: CLOSED REDUCTION PINNING RIGHT INDEX PROXIMAL PHALANX FRACTURE (Right Finger)     Patient location during evaluation: PACU Anesthesia Type: General Level of consciousness: awake and alert Pain management: pain level controlled Vital Signs Assessment: post-procedure vital signs reviewed and stable Respiratory status: spontaneous breathing, nonlabored ventilation, respiratory function stable and patient connected to nasal cannula oxygen Cardiovascular status: blood pressure returned to baseline and stable Postop Assessment: no apparent nausea or vomiting Anesthetic complications: no    Last Vitals:  Vitals:   02/18/17 1939 02/18/17 1943  BP: (!) 149/70 (!) 151/65  Pulse: 69 68  Resp: 19 17  Temp: 36.8 C   SpO2: 100% 97%    Last Pain:  Vitals:   02/18/17 1943  TempSrc:   PainSc: 0-No pain                 Breanna Shorkey P Sandrine Bloodsworth

## 2017-03-06 DIAGNOSIS — S62610D Displaced fracture of proximal phalanx of right index finger, subsequent encounter for fracture with routine healing: Secondary | ICD-10-CM | POA: Diagnosis not present

## 2017-03-12 DIAGNOSIS — R29898 Other symptoms and signs involving the musculoskeletal system: Secondary | ICD-10-CM | POA: Diagnosis not present

## 2017-03-18 DIAGNOSIS — M25641 Stiffness of right hand, not elsewhere classified: Secondary | ICD-10-CM | POA: Diagnosis not present

## 2017-03-20 DIAGNOSIS — G894 Chronic pain syndrome: Secondary | ICD-10-CM | POA: Diagnosis present

## 2017-03-20 DIAGNOSIS — Z79891 Long term (current) use of opiate analgesic: Secondary | ICD-10-CM

## 2017-03-21 DIAGNOSIS — S62610D Displaced fracture of proximal phalanx of right index finger, subsequent encounter for fracture with routine healing: Secondary | ICD-10-CM | POA: Diagnosis not present

## 2017-03-25 DIAGNOSIS — M25641 Stiffness of right hand, not elsewhere classified: Secondary | ICD-10-CM | POA: Diagnosis not present

## 2017-04-01 DIAGNOSIS — M25641 Stiffness of right hand, not elsewhere classified: Secondary | ICD-10-CM | POA: Diagnosis not present

## 2017-04-01 DIAGNOSIS — Z4889 Encounter for other specified surgical aftercare: Secondary | ICD-10-CM | POA: Diagnosis not present

## 2017-04-01 DIAGNOSIS — S62610D Displaced fracture of proximal phalanx of right index finger, subsequent encounter for fracture with routine healing: Secondary | ICD-10-CM | POA: Diagnosis not present

## 2017-04-02 DIAGNOSIS — T50904A Poisoning by unspecified drugs, medicaments and biological substances, undetermined, initial encounter: Secondary | ICD-10-CM | POA: Diagnosis not present

## 2017-04-02 DIAGNOSIS — G894 Chronic pain syndrome: Secondary | ICD-10-CM | POA: Diagnosis not present

## 2017-04-02 DIAGNOSIS — Z79891 Long term (current) use of opiate analgesic: Secondary | ICD-10-CM | POA: Diagnosis not present

## 2017-04-04 DIAGNOSIS — S62610D Displaced fracture of proximal phalanx of right index finger, subsequent encounter for fracture with routine healing: Secondary | ICD-10-CM | POA: Diagnosis not present

## 2017-04-04 DIAGNOSIS — M25641 Stiffness of right hand, not elsewhere classified: Secondary | ICD-10-CM | POA: Diagnosis not present

## 2017-04-10 DIAGNOSIS — M25641 Stiffness of right hand, not elsewhere classified: Secondary | ICD-10-CM | POA: Diagnosis not present

## 2017-04-11 DIAGNOSIS — M25469 Effusion, unspecified knee: Secondary | ICD-10-CM | POA: Diagnosis not present

## 2017-04-17 DIAGNOSIS — H26492 Other secondary cataract, left eye: Secondary | ICD-10-CM | POA: Diagnosis not present

## 2017-04-17 DIAGNOSIS — H16223 Keratoconjunctivitis sicca, not specified as Sjogren's, bilateral: Secondary | ICD-10-CM | POA: Diagnosis not present

## 2017-04-17 DIAGNOSIS — H43813 Vitreous degeneration, bilateral: Secondary | ICD-10-CM | POA: Diagnosis not present

## 2017-04-17 DIAGNOSIS — Z961 Presence of intraocular lens: Secondary | ICD-10-CM | POA: Diagnosis not present

## 2017-05-02 DIAGNOSIS — M1711 Unilateral primary osteoarthritis, right knee: Secondary | ICD-10-CM | POA: Diagnosis not present

## 2017-05-02 DIAGNOSIS — M25861 Other specified joint disorders, right knee: Secondary | ICD-10-CM | POA: Diagnosis not present

## 2017-05-10 DIAGNOSIS — M25561 Pain in right knee: Secondary | ICD-10-CM | POA: Diagnosis not present

## 2017-05-10 DIAGNOSIS — R2241 Localized swelling, mass and lump, right lower limb: Secondary | ICD-10-CM | POA: Diagnosis not present

## 2017-05-23 DIAGNOSIS — M1711 Unilateral primary osteoarthritis, right knee: Secondary | ICD-10-CM | POA: Diagnosis not present

## 2017-07-07 DIAGNOSIS — Z08 Encounter for follow-up examination after completed treatment for malignant neoplasm: Secondary | ICD-10-CM | POA: Diagnosis not present

## 2017-07-07 DIAGNOSIS — C760 Malignant neoplasm of head, face and neck: Secondary | ICD-10-CM | POA: Diagnosis not present

## 2017-07-07 DIAGNOSIS — Z85818 Personal history of malignant neoplasm of other sites of lip, oral cavity, and pharynx: Secondary | ICD-10-CM | POA: Diagnosis not present

## 2017-07-07 DIAGNOSIS — Z85810 Personal history of malignant neoplasm of tongue: Secondary | ICD-10-CM | POA: Diagnosis not present

## 2017-07-18 DIAGNOSIS — K210 Gastro-esophageal reflux disease with esophagitis: Secondary | ICD-10-CM | POA: Diagnosis not present

## 2017-07-18 DIAGNOSIS — F419 Anxiety disorder, unspecified: Secondary | ICD-10-CM | POA: Diagnosis not present

## 2017-07-18 DIAGNOSIS — E782 Mixed hyperlipidemia: Secondary | ICD-10-CM | POA: Diagnosis not present

## 2017-07-18 DIAGNOSIS — Z0000 Encounter for general adult medical examination without abnormal findings: Secondary | ICD-10-CM | POA: Diagnosis not present

## 2017-07-18 DIAGNOSIS — I10 Essential (primary) hypertension: Secondary | ICD-10-CM | POA: Diagnosis not present

## 2017-07-18 DIAGNOSIS — Z1389 Encounter for screening for other disorder: Secondary | ICD-10-CM | POA: Diagnosis not present

## 2017-07-22 DIAGNOSIS — I10 Essential (primary) hypertension: Secondary | ICD-10-CM | POA: Diagnosis not present

## 2017-07-22 DIAGNOSIS — F419 Anxiety disorder, unspecified: Secondary | ICD-10-CM | POA: Diagnosis not present

## 2017-07-22 DIAGNOSIS — Z0000 Encounter for general adult medical examination without abnormal findings: Secondary | ICD-10-CM | POA: Diagnosis not present

## 2017-07-22 DIAGNOSIS — K210 Gastro-esophageal reflux disease with esophagitis: Secondary | ICD-10-CM | POA: Diagnosis not present

## 2017-07-22 DIAGNOSIS — E782 Mixed hyperlipidemia: Secondary | ICD-10-CM | POA: Diagnosis not present

## 2017-08-04 DIAGNOSIS — R05 Cough: Secondary | ICD-10-CM | POA: Diagnosis not present

## 2017-08-18 DIAGNOSIS — G894 Chronic pain syndrome: Secondary | ICD-10-CM | POA: Diagnosis not present

## 2017-08-18 DIAGNOSIS — Z79891 Long term (current) use of opiate analgesic: Secondary | ICD-10-CM | POA: Diagnosis not present

## 2017-08-18 DIAGNOSIS — M5136 Other intervertebral disc degeneration, lumbar region: Secondary | ICD-10-CM | POA: Diagnosis not present

## 2017-09-01 DIAGNOSIS — D485 Neoplasm of uncertain behavior of skin: Secondary | ICD-10-CM | POA: Diagnosis not present

## 2017-09-01 DIAGNOSIS — D1801 Hemangioma of skin and subcutaneous tissue: Secondary | ICD-10-CM | POA: Diagnosis not present

## 2017-09-01 DIAGNOSIS — L821 Other seborrheic keratosis: Secondary | ICD-10-CM | POA: Diagnosis not present

## 2017-09-01 DIAGNOSIS — Z85828 Personal history of other malignant neoplasm of skin: Secondary | ICD-10-CM | POA: Diagnosis not present

## 2017-09-01 DIAGNOSIS — D280 Benign neoplasm of vulva: Secondary | ICD-10-CM | POA: Diagnosis not present

## 2017-11-20 DIAGNOSIS — Z23 Encounter for immunization: Secondary | ICD-10-CM | POA: Diagnosis not present

## 2017-12-15 DIAGNOSIS — M546 Pain in thoracic spine: Secondary | ICD-10-CM | POA: Diagnosis not present

## 2017-12-15 DIAGNOSIS — M545 Low back pain: Secondary | ICD-10-CM | POA: Diagnosis not present

## 2017-12-15 DIAGNOSIS — G894 Chronic pain syndrome: Secondary | ICD-10-CM | POA: Diagnosis not present

## 2018-02-04 DIAGNOSIS — H16223 Keratoconjunctivitis sicca, not specified as Sjogren's, bilateral: Secondary | ICD-10-CM | POA: Diagnosis not present

## 2018-02-04 DIAGNOSIS — H26492 Other secondary cataract, left eye: Secondary | ICD-10-CM | POA: Diagnosis not present

## 2018-02-04 DIAGNOSIS — D23112 Other benign neoplasm of skin of right lower eyelid, including canthus: Secondary | ICD-10-CM | POA: Diagnosis not present

## 2018-02-04 DIAGNOSIS — H43813 Vitreous degeneration, bilateral: Secondary | ICD-10-CM | POA: Diagnosis not present

## 2018-02-04 DIAGNOSIS — Z961 Presence of intraocular lens: Secondary | ICD-10-CM | POA: Diagnosis not present

## 2018-02-05 DIAGNOSIS — F419 Anxiety disorder, unspecified: Secondary | ICD-10-CM | POA: Diagnosis not present

## 2018-03-09 DIAGNOSIS — Z9889 Other specified postprocedural states: Secondary | ICD-10-CM | POA: Diagnosis not present

## 2018-03-09 DIAGNOSIS — C760 Malignant neoplasm of head, face and neck: Secondary | ICD-10-CM | POA: Diagnosis not present

## 2018-03-09 DIAGNOSIS — Z08 Encounter for follow-up examination after completed treatment for malignant neoplasm: Secondary | ICD-10-CM | POA: Diagnosis not present

## 2018-03-09 DIAGNOSIS — Z85810 Personal history of malignant neoplasm of tongue: Secondary | ICD-10-CM | POA: Diagnosis not present

## 2018-03-30 DIAGNOSIS — K5730 Diverticulosis of large intestine without perforation or abscess without bleeding: Secondary | ICD-10-CM | POA: Diagnosis not present

## 2018-03-30 DIAGNOSIS — R1011 Right upper quadrant pain: Secondary | ICD-10-CM | POA: Diagnosis not present

## 2018-04-13 DIAGNOSIS — M545 Low back pain: Secondary | ICD-10-CM | POA: Diagnosis not present

## 2018-04-13 DIAGNOSIS — M546 Pain in thoracic spine: Secondary | ICD-10-CM | POA: Diagnosis not present

## 2018-04-13 DIAGNOSIS — M1612 Unilateral primary osteoarthritis, left hip: Secondary | ICD-10-CM | POA: Diagnosis not present

## 2018-04-13 DIAGNOSIS — G894 Chronic pain syndrome: Secondary | ICD-10-CM | POA: Diagnosis not present

## 2018-07-14 DIAGNOSIS — N814 Uterovaginal prolapse, unspecified: Secondary | ICD-10-CM | POA: Diagnosis not present

## 2018-07-14 DIAGNOSIS — N816 Rectocele: Secondary | ICD-10-CM | POA: Diagnosis not present

## 2018-08-12 DIAGNOSIS — Z79899 Other long term (current) drug therapy: Secondary | ICD-10-CM | POA: Diagnosis not present

## 2018-08-12 DIAGNOSIS — Z79891 Long term (current) use of opiate analgesic: Secondary | ICD-10-CM | POA: Diagnosis not present

## 2018-08-12 DIAGNOSIS — G894 Chronic pain syndrome: Secondary | ICD-10-CM | POA: Diagnosis not present

## 2018-08-12 DIAGNOSIS — Z5181 Encounter for therapeutic drug level monitoring: Secondary | ICD-10-CM | POA: Diagnosis not present

## 2018-08-12 DIAGNOSIS — M5136 Other intervertebral disc degeneration, lumbar region: Secondary | ICD-10-CM | POA: Diagnosis not present

## 2018-09-02 DIAGNOSIS — G479 Sleep disorder, unspecified: Secondary | ICD-10-CM | POA: Diagnosis not present

## 2018-09-02 DIAGNOSIS — E782 Mixed hyperlipidemia: Secondary | ICD-10-CM | POA: Diagnosis not present

## 2018-09-02 DIAGNOSIS — M8580 Other specified disorders of bone density and structure, unspecified site: Secondary | ICD-10-CM | POA: Diagnosis not present

## 2018-09-02 DIAGNOSIS — I10 Essential (primary) hypertension: Secondary | ICD-10-CM | POA: Diagnosis not present

## 2018-09-02 DIAGNOSIS — K210 Gastro-esophageal reflux disease with esophagitis: Secondary | ICD-10-CM | POA: Diagnosis not present

## 2018-09-02 DIAGNOSIS — Z0000 Encounter for general adult medical examination without abnormal findings: Secondary | ICD-10-CM | POA: Diagnosis not present

## 2018-09-02 DIAGNOSIS — M1990 Unspecified osteoarthritis, unspecified site: Secondary | ICD-10-CM | POA: Diagnosis not present

## 2018-09-02 DIAGNOSIS — F419 Anxiety disorder, unspecified: Secondary | ICD-10-CM | POA: Diagnosis not present

## 2018-09-09 DIAGNOSIS — Z85828 Personal history of other malignant neoplasm of skin: Secondary | ICD-10-CM | POA: Diagnosis not present

## 2018-09-09 DIAGNOSIS — B029 Zoster without complications: Secondary | ICD-10-CM | POA: Diagnosis not present

## 2018-10-19 DIAGNOSIS — Z85810 Personal history of malignant neoplasm of tongue: Secondary | ICD-10-CM | POA: Diagnosis not present

## 2018-10-19 DIAGNOSIS — Z08 Encounter for follow-up examination after completed treatment for malignant neoplasm: Secondary | ICD-10-CM | POA: Diagnosis not present

## 2018-10-19 DIAGNOSIS — C760 Malignant neoplasm of head, face and neck: Secondary | ICD-10-CM | POA: Diagnosis not present

## 2018-11-10 DIAGNOSIS — Z85828 Personal history of other malignant neoplasm of skin: Secondary | ICD-10-CM | POA: Diagnosis not present

## 2018-11-10 DIAGNOSIS — D2372 Other benign neoplasm of skin of left lower limb, including hip: Secondary | ICD-10-CM | POA: Diagnosis not present

## 2018-11-10 DIAGNOSIS — D1801 Hemangioma of skin and subcutaneous tissue: Secondary | ICD-10-CM | POA: Diagnosis not present

## 2018-11-10 DIAGNOSIS — D225 Melanocytic nevi of trunk: Secondary | ICD-10-CM | POA: Diagnosis not present

## 2018-11-10 DIAGNOSIS — L821 Other seborrheic keratosis: Secondary | ICD-10-CM | POA: Diagnosis not present

## 2018-11-12 DIAGNOSIS — Z23 Encounter for immunization: Secondary | ICD-10-CM | POA: Diagnosis not present

## 2018-11-27 DIAGNOSIS — E782 Mixed hyperlipidemia: Secondary | ICD-10-CM | POA: Diagnosis not present

## 2018-11-27 DIAGNOSIS — M8580 Other specified disorders of bone density and structure, unspecified site: Secondary | ICD-10-CM | POA: Diagnosis not present

## 2018-11-27 DIAGNOSIS — I10 Essential (primary) hypertension: Secondary | ICD-10-CM | POA: Diagnosis not present

## 2018-12-14 DIAGNOSIS — M8580 Other specified disorders of bone density and structure, unspecified site: Secondary | ICD-10-CM | POA: Diagnosis not present

## 2018-12-14 DIAGNOSIS — Z79891 Long term (current) use of opiate analgesic: Secondary | ICD-10-CM | POA: Diagnosis not present

## 2018-12-14 DIAGNOSIS — I10 Essential (primary) hypertension: Secondary | ICD-10-CM | POA: Diagnosis not present

## 2018-12-14 DIAGNOSIS — M5136 Other intervertebral disc degeneration, lumbar region: Secondary | ICD-10-CM | POA: Diagnosis not present

## 2018-12-14 DIAGNOSIS — E782 Mixed hyperlipidemia: Secondary | ICD-10-CM | POA: Diagnosis not present

## 2018-12-14 DIAGNOSIS — G894 Chronic pain syndrome: Secondary | ICD-10-CM | POA: Diagnosis not present

## 2019-01-15 DIAGNOSIS — M1711 Unilateral primary osteoarthritis, right knee: Secondary | ICD-10-CM | POA: Diagnosis not present

## 2019-01-25 DIAGNOSIS — F419 Anxiety disorder, unspecified: Secondary | ICD-10-CM | POA: Diagnosis not present

## 2019-04-12 DIAGNOSIS — R300 Dysuria: Secondary | ICD-10-CM | POA: Diagnosis not present

## 2019-04-12 DIAGNOSIS — R310 Gross hematuria: Secondary | ICD-10-CM | POA: Diagnosis not present

## 2019-04-15 DIAGNOSIS — G894 Chronic pain syndrome: Secondary | ICD-10-CM | POA: Diagnosis not present

## 2019-04-15 DIAGNOSIS — Z79891 Long term (current) use of opiate analgesic: Secondary | ICD-10-CM | POA: Diagnosis not present

## 2019-04-15 DIAGNOSIS — M5136 Other intervertebral disc degeneration, lumbar region: Secondary | ICD-10-CM | POA: Diagnosis not present

## 2019-05-31 DIAGNOSIS — H26493 Other secondary cataract, bilateral: Secondary | ICD-10-CM | POA: Diagnosis not present

## 2019-05-31 DIAGNOSIS — H43813 Vitreous degeneration, bilateral: Secondary | ICD-10-CM | POA: Diagnosis not present

## 2019-05-31 DIAGNOSIS — Z961 Presence of intraocular lens: Secondary | ICD-10-CM | POA: Diagnosis not present

## 2019-05-31 DIAGNOSIS — H16223 Keratoconjunctivitis sicca, not specified as Sjogren's, bilateral: Secondary | ICD-10-CM | POA: Diagnosis not present

## 2019-06-09 DIAGNOSIS — C760 Malignant neoplasm of head, face and neck: Secondary | ICD-10-CM | POA: Diagnosis not present

## 2019-06-30 IMAGING — DX DG HAND COMPLETE 3+V*R*
3 series · 3 of 3 positions shown · non-contrast
Comparison: None.

CLINICAL DATA: Syncope.  Fall.  Right hand pain

EXAM:
RIGHT HAND - COMPLETE 3+ VIEW

[hand pa]
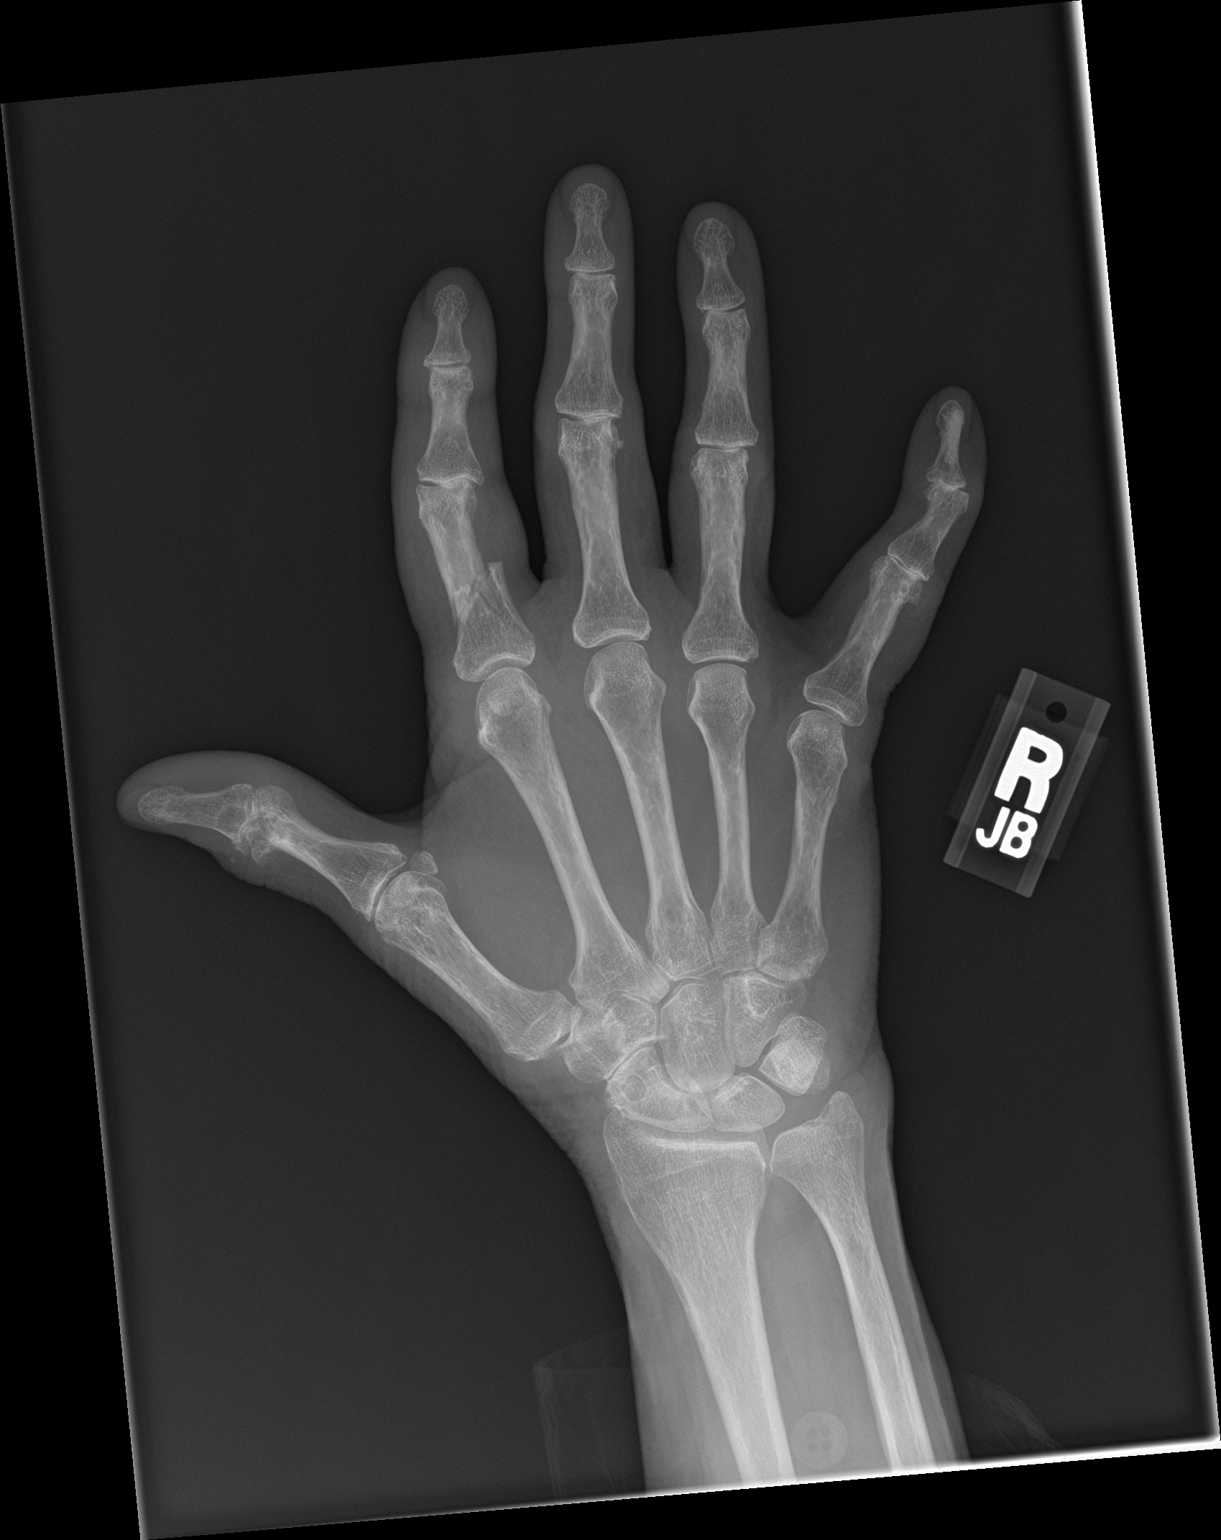

[hand obl]
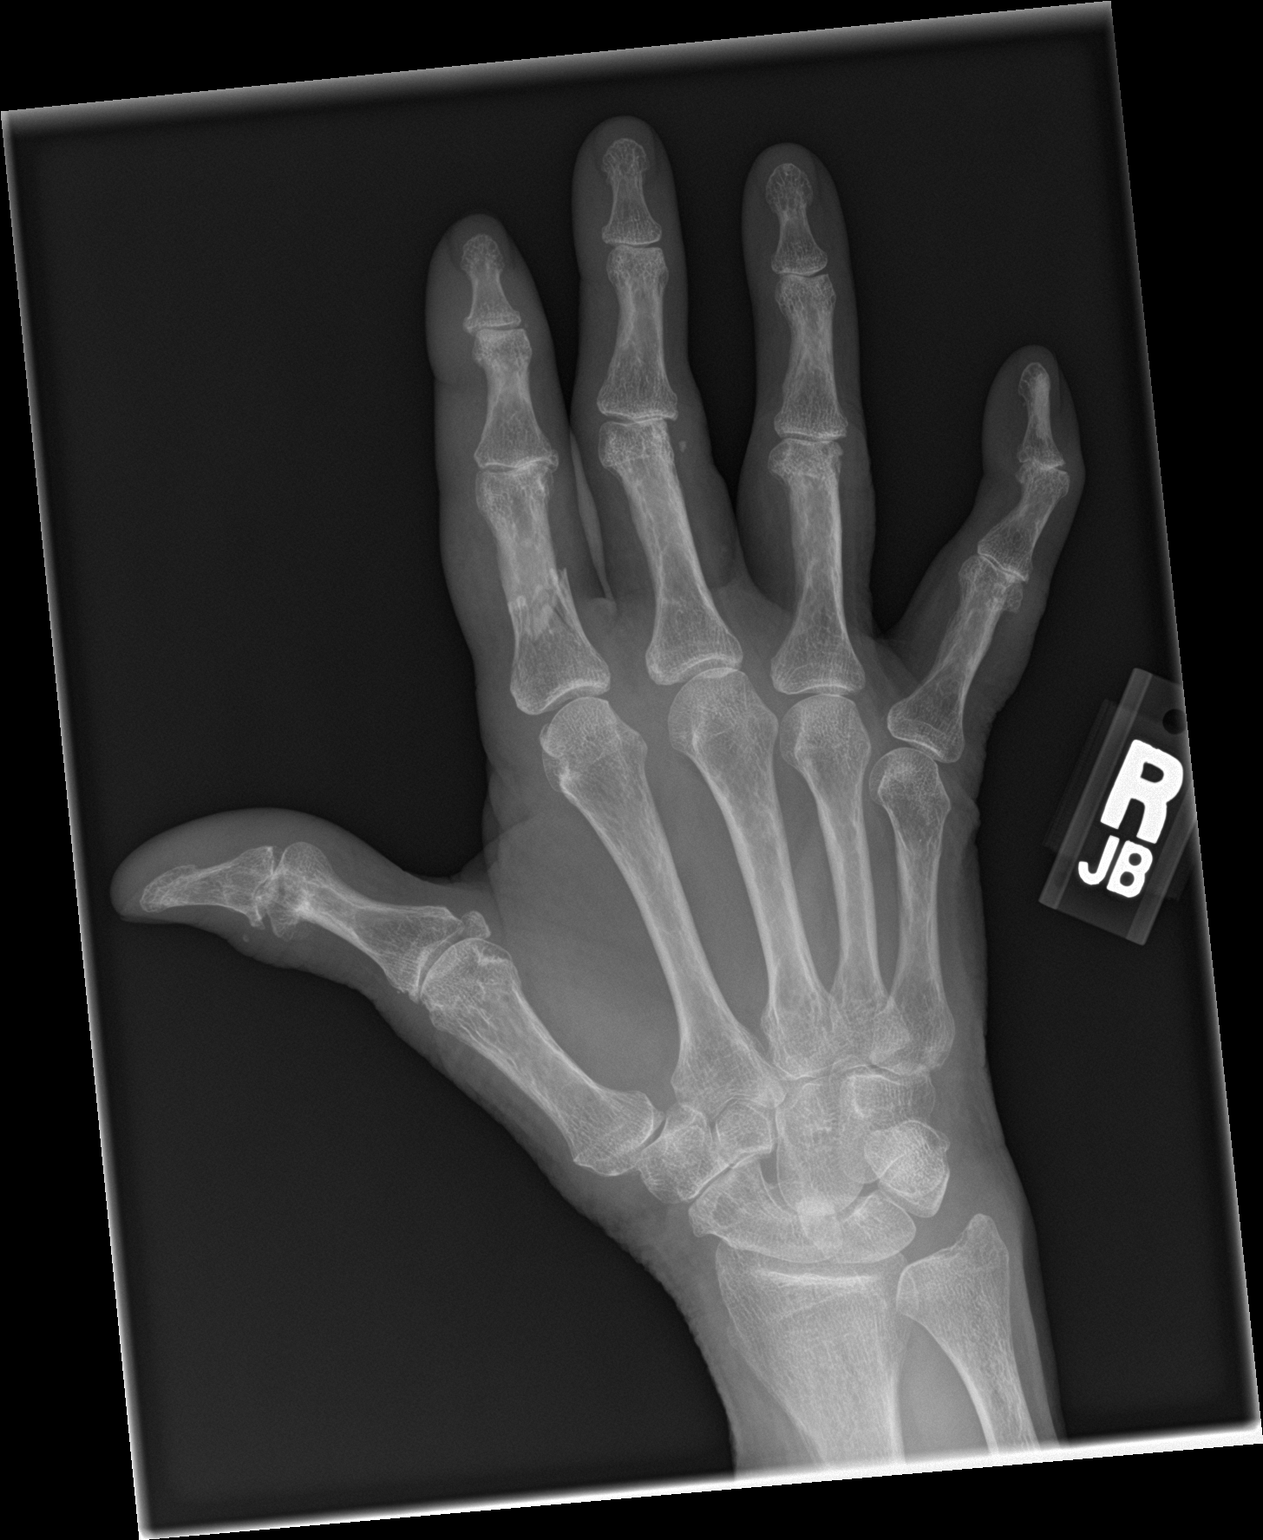

[hand lat]
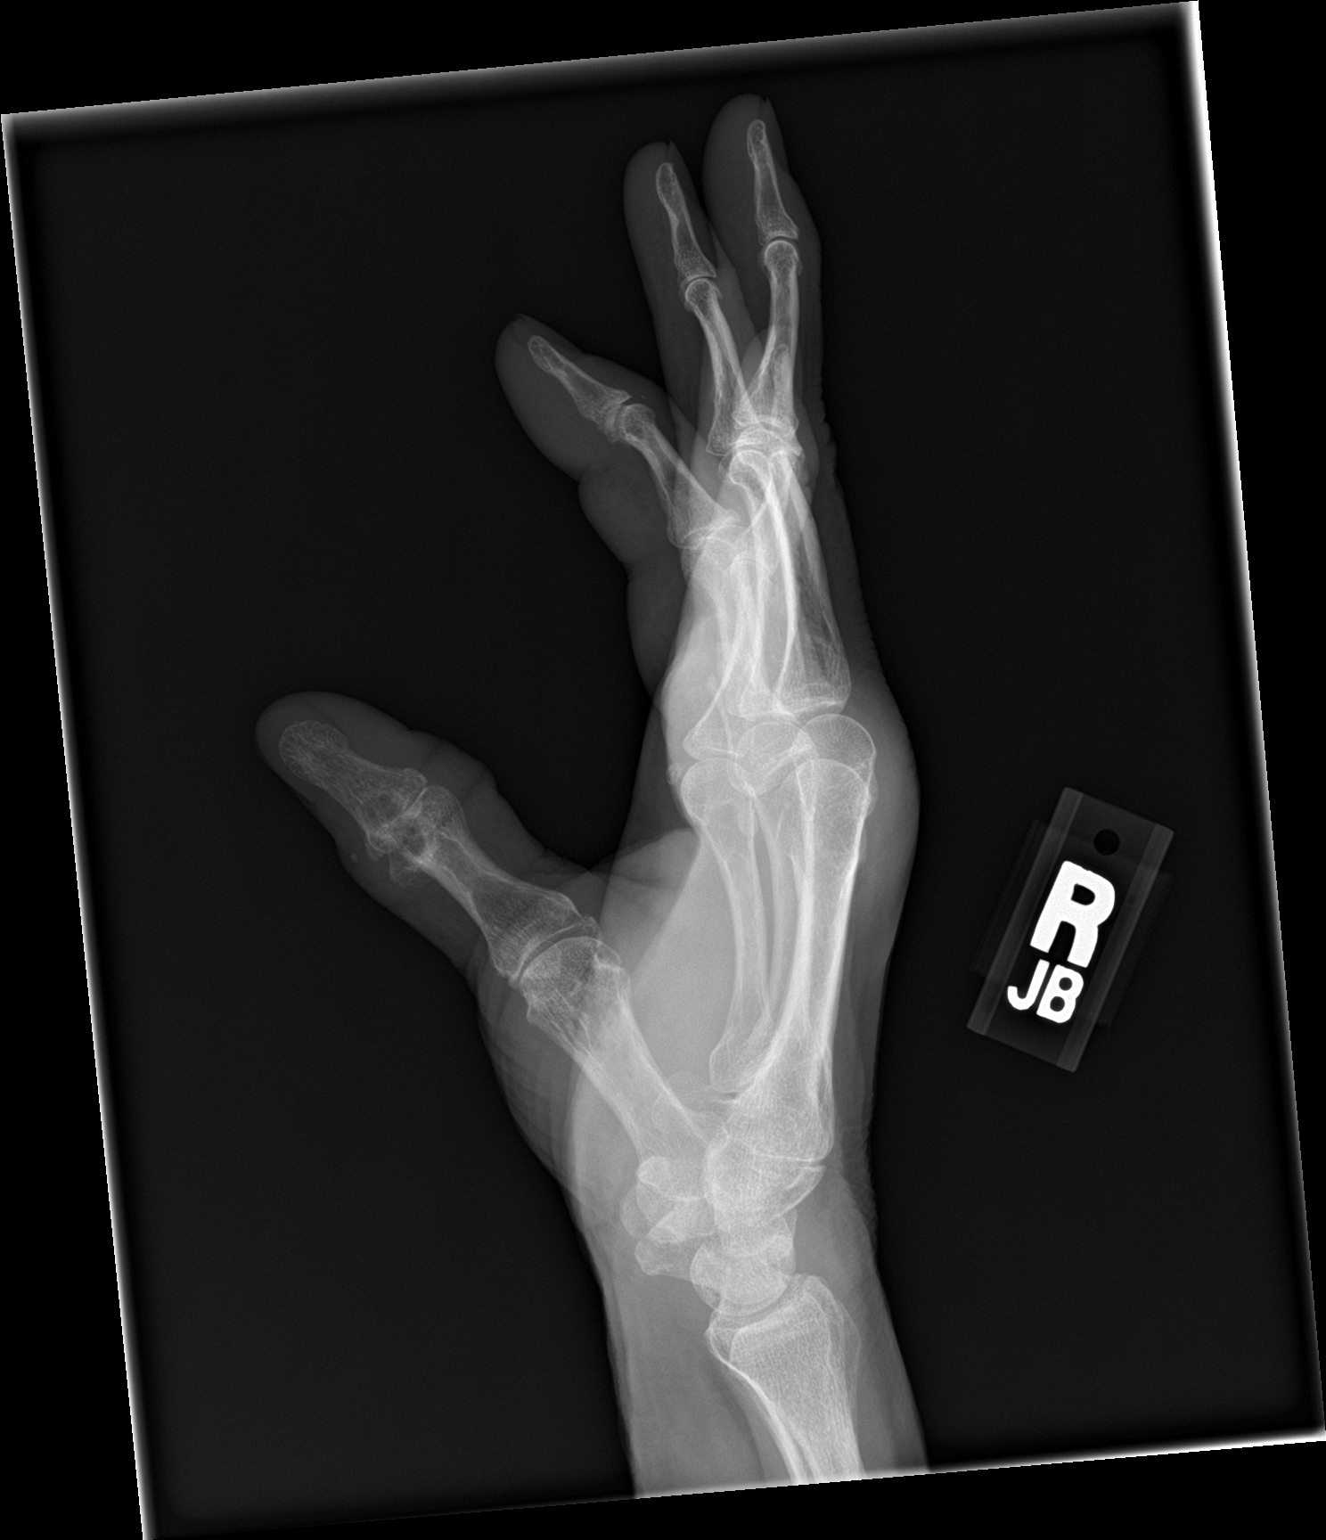

[3 of 3 positions shown; findings below may reference images not displayed]

FINDINGS: Fracture noted through the proximal phalanx of the right index
finger, comminuted and mildly displaced. No subluxation or
dislocation. Mild osteoarthritic changes throughout the IP joints
and first carpometacarpal joint. Cystic change in the scaphoid.
IMPRESSION: Mildly comminuted and displaced fracture through the proximal
phalanx right index finger.

## 2019-07-29 DIAGNOSIS — E782 Mixed hyperlipidemia: Secondary | ICD-10-CM | POA: Diagnosis not present

## 2019-07-29 DIAGNOSIS — I10 Essential (primary) hypertension: Secondary | ICD-10-CM | POA: Diagnosis not present

## 2019-07-29 DIAGNOSIS — M8580 Other specified disorders of bone density and structure, unspecified site: Secondary | ICD-10-CM | POA: Diagnosis not present

## 2019-08-12 DIAGNOSIS — G894 Chronic pain syndrome: Secondary | ICD-10-CM | POA: Diagnosis not present

## 2019-08-12 DIAGNOSIS — Z79891 Long term (current) use of opiate analgesic: Secondary | ICD-10-CM | POA: Diagnosis not present

## 2019-08-12 DIAGNOSIS — M5136 Other intervertebral disc degeneration, lumbar region: Secondary | ICD-10-CM | POA: Diagnosis not present

## 2019-08-12 DIAGNOSIS — Z79899 Other long term (current) drug therapy: Secondary | ICD-10-CM | POA: Diagnosis not present

## 2019-08-12 DIAGNOSIS — Z5181 Encounter for therapeutic drug level monitoring: Secondary | ICD-10-CM | POA: Diagnosis not present

## 2019-09-13 DIAGNOSIS — Z0000 Encounter for general adult medical examination without abnormal findings: Secondary | ICD-10-CM | POA: Diagnosis not present

## 2019-09-13 DIAGNOSIS — F419 Anxiety disorder, unspecified: Secondary | ICD-10-CM | POA: Diagnosis not present

## 2019-09-13 DIAGNOSIS — K219 Gastro-esophageal reflux disease without esophagitis: Secondary | ICD-10-CM | POA: Diagnosis not present

## 2019-09-13 DIAGNOSIS — I10 Essential (primary) hypertension: Secondary | ICD-10-CM | POA: Diagnosis not present

## 2019-09-13 DIAGNOSIS — E782 Mixed hyperlipidemia: Secondary | ICD-10-CM | POA: Diagnosis not present

## 2019-11-01 DIAGNOSIS — I10 Essential (primary) hypertension: Secondary | ICD-10-CM | POA: Diagnosis not present

## 2019-11-01 DIAGNOSIS — E782 Mixed hyperlipidemia: Secondary | ICD-10-CM | POA: Diagnosis not present

## 2019-11-01 DIAGNOSIS — M8580 Other specified disorders of bone density and structure, unspecified site: Secondary | ICD-10-CM | POA: Diagnosis not present

## 2019-11-22 DIAGNOSIS — D2372 Other benign neoplasm of skin of left lower limb, including hip: Secondary | ICD-10-CM | POA: Diagnosis not present

## 2019-11-22 DIAGNOSIS — D1801 Hemangioma of skin and subcutaneous tissue: Secondary | ICD-10-CM | POA: Diagnosis not present

## 2019-11-22 DIAGNOSIS — D225 Melanocytic nevi of trunk: Secondary | ICD-10-CM | POA: Diagnosis not present

## 2019-11-22 DIAGNOSIS — L821 Other seborrheic keratosis: Secondary | ICD-10-CM | POA: Diagnosis not present

## 2019-11-22 DIAGNOSIS — Z85828 Personal history of other malignant neoplasm of skin: Secondary | ICD-10-CM | POA: Diagnosis not present

## 2019-11-22 DIAGNOSIS — D0439 Carcinoma in situ of skin of other parts of face: Secondary | ICD-10-CM | POA: Diagnosis not present

## 2019-12-07 DIAGNOSIS — Z23 Encounter for immunization: Secondary | ICD-10-CM | POA: Diagnosis not present

## 2019-12-09 DIAGNOSIS — Z79891 Long term (current) use of opiate analgesic: Secondary | ICD-10-CM | POA: Diagnosis not present

## 2019-12-09 DIAGNOSIS — G894 Chronic pain syndrome: Secondary | ICD-10-CM | POA: Diagnosis not present

## 2019-12-09 DIAGNOSIS — M5136 Other intervertebral disc degeneration, lumbar region: Secondary | ICD-10-CM | POA: Diagnosis not present

## 2019-12-20 DIAGNOSIS — Z85828 Personal history of other malignant neoplasm of skin: Secondary | ICD-10-CM | POA: Diagnosis not present

## 2019-12-20 DIAGNOSIS — C44319 Basal cell carcinoma of skin of other parts of face: Secondary | ICD-10-CM | POA: Diagnosis not present

## 2020-01-15 ENCOUNTER — Ambulatory Visit: Payer: Self-pay

## 2020-02-07 DIAGNOSIS — Z23 Encounter for immunization: Secondary | ICD-10-CM | POA: Diagnosis not present

## 2020-02-16 DIAGNOSIS — C760 Malignant neoplasm of head, face and neck: Secondary | ICD-10-CM | POA: Diagnosis not present

## 2020-02-16 DIAGNOSIS — Z08 Encounter for follow-up examination after completed treatment for malignant neoplasm: Secondary | ICD-10-CM | POA: Diagnosis not present

## 2020-02-16 DIAGNOSIS — Z85810 Personal history of malignant neoplasm of tongue: Secondary | ICD-10-CM | POA: Diagnosis not present

## 2020-02-20 DIAGNOSIS — E782 Mixed hyperlipidemia: Secondary | ICD-10-CM | POA: Diagnosis not present

## 2020-02-20 DIAGNOSIS — I10 Essential (primary) hypertension: Secondary | ICD-10-CM | POA: Diagnosis not present

## 2020-02-20 DIAGNOSIS — K219 Gastro-esophageal reflux disease without esophagitis: Secondary | ICD-10-CM | POA: Diagnosis not present

## 2020-02-20 DIAGNOSIS — M8580 Other specified disorders of bone density and structure, unspecified site: Secondary | ICD-10-CM | POA: Diagnosis not present

## 2020-04-10 DIAGNOSIS — M5136 Other intervertebral disc degeneration, lumbar region: Secondary | ICD-10-CM | POA: Diagnosis not present

## 2020-04-10 DIAGNOSIS — G894 Chronic pain syndrome: Secondary | ICD-10-CM | POA: Diagnosis not present

## 2020-04-19 DIAGNOSIS — M1990 Unspecified osteoarthritis, unspecified site: Secondary | ICD-10-CM | POA: Diagnosis not present

## 2020-04-19 DIAGNOSIS — I10 Essential (primary) hypertension: Secondary | ICD-10-CM | POA: Diagnosis not present

## 2020-04-19 DIAGNOSIS — E782 Mixed hyperlipidemia: Secondary | ICD-10-CM | POA: Diagnosis not present

## 2020-04-19 DIAGNOSIS — K219 Gastro-esophageal reflux disease without esophagitis: Secondary | ICD-10-CM | POA: Diagnosis not present

## 2020-04-19 DIAGNOSIS — M8580 Other specified disorders of bone density and structure, unspecified site: Secondary | ICD-10-CM | POA: Diagnosis not present

## 2020-04-24 DIAGNOSIS — Z85828 Personal history of other malignant neoplasm of skin: Secondary | ICD-10-CM | POA: Diagnosis not present

## 2020-04-24 DIAGNOSIS — L080 Pyoderma: Secondary | ICD-10-CM | POA: Diagnosis not present

## 2020-04-24 DIAGNOSIS — D485 Neoplasm of uncertain behavior of skin: Secondary | ICD-10-CM | POA: Diagnosis not present

## 2020-04-24 DIAGNOSIS — D2339 Other benign neoplasm of skin of other parts of face: Secondary | ICD-10-CM | POA: Diagnosis not present

## 2020-05-08 DIAGNOSIS — M1990 Unspecified osteoarthritis, unspecified site: Secondary | ICD-10-CM | POA: Diagnosis not present

## 2020-05-08 DIAGNOSIS — I10 Essential (primary) hypertension: Secondary | ICD-10-CM | POA: Diagnosis not present

## 2020-05-08 DIAGNOSIS — F419 Anxiety disorder, unspecified: Secondary | ICD-10-CM | POA: Diagnosis not present

## 2020-05-30 DIAGNOSIS — R300 Dysuria: Secondary | ICD-10-CM | POA: Diagnosis not present

## 2020-05-30 DIAGNOSIS — F419 Anxiety disorder, unspecified: Secondary | ICD-10-CM | POA: Diagnosis not present

## 2020-06-21 DIAGNOSIS — Z87440 Personal history of urinary (tract) infections: Secondary | ICD-10-CM | POA: Diagnosis not present

## 2020-06-21 DIAGNOSIS — R319 Hematuria, unspecified: Secondary | ICD-10-CM | POA: Diagnosis not present

## 2020-06-22 DIAGNOSIS — K219 Gastro-esophageal reflux disease without esophagitis: Secondary | ICD-10-CM | POA: Diagnosis not present

## 2020-06-22 DIAGNOSIS — E782 Mixed hyperlipidemia: Secondary | ICD-10-CM | POA: Diagnosis not present

## 2020-06-22 DIAGNOSIS — I10 Essential (primary) hypertension: Secondary | ICD-10-CM | POA: Diagnosis not present

## 2020-06-22 DIAGNOSIS — Z87440 Personal history of urinary (tract) infections: Secondary | ICD-10-CM | POA: Diagnosis not present

## 2020-06-22 DIAGNOSIS — F419 Anxiety disorder, unspecified: Secondary | ICD-10-CM | POA: Diagnosis not present

## 2020-06-22 DIAGNOSIS — R300 Dysuria: Secondary | ICD-10-CM | POA: Diagnosis not present

## 2020-06-22 DIAGNOSIS — R319 Hematuria, unspecified: Secondary | ICD-10-CM | POA: Diagnosis not present

## 2020-06-30 DIAGNOSIS — I10 Essential (primary) hypertension: Secondary | ICD-10-CM | POA: Diagnosis not present

## 2020-06-30 DIAGNOSIS — K219 Gastro-esophageal reflux disease without esophagitis: Secondary | ICD-10-CM | POA: Diagnosis not present

## 2020-06-30 DIAGNOSIS — E782 Mixed hyperlipidemia: Secondary | ICD-10-CM | POA: Diagnosis not present

## 2020-06-30 DIAGNOSIS — M1990 Unspecified osteoarthritis, unspecified site: Secondary | ICD-10-CM | POA: Diagnosis not present

## 2020-06-30 DIAGNOSIS — M8580 Other specified disorders of bone density and structure, unspecified site: Secondary | ICD-10-CM | POA: Diagnosis not present

## 2020-07-14 DIAGNOSIS — F419 Anxiety disorder, unspecified: Secondary | ICD-10-CM | POA: Diagnosis not present

## 2020-07-14 DIAGNOSIS — G4700 Insomnia, unspecified: Secondary | ICD-10-CM | POA: Diagnosis not present

## 2020-07-29 DIAGNOSIS — Z23 Encounter for immunization: Secondary | ICD-10-CM | POA: Diagnosis not present

## 2020-08-07 DIAGNOSIS — Z5181 Encounter for therapeutic drug level monitoring: Secondary | ICD-10-CM | POA: Diagnosis not present

## 2020-08-07 DIAGNOSIS — Z79899 Other long term (current) drug therapy: Secondary | ICD-10-CM | POA: Diagnosis not present

## 2020-09-14 DIAGNOSIS — F419 Anxiety disorder, unspecified: Secondary | ICD-10-CM | POA: Diagnosis not present

## 2020-09-14 DIAGNOSIS — I10 Essential (primary) hypertension: Secondary | ICD-10-CM | POA: Diagnosis not present

## 2020-09-14 DIAGNOSIS — Z0000 Encounter for general adult medical examination without abnormal findings: Secondary | ICD-10-CM | POA: Diagnosis not present

## 2020-09-14 DIAGNOSIS — Z87440 Personal history of urinary (tract) infections: Secondary | ICD-10-CM | POA: Diagnosis not present

## 2020-09-14 DIAGNOSIS — R300 Dysuria: Secondary | ICD-10-CM | POA: Diagnosis not present

## 2020-09-14 DIAGNOSIS — K219 Gastro-esophageal reflux disease without esophagitis: Secondary | ICD-10-CM | POA: Diagnosis not present

## 2020-09-14 DIAGNOSIS — G4700 Insomnia, unspecified: Secondary | ICD-10-CM | POA: Diagnosis not present

## 2020-09-14 DIAGNOSIS — R319 Hematuria, unspecified: Secondary | ICD-10-CM | POA: Diagnosis not present

## 2020-09-14 DIAGNOSIS — E782 Mixed hyperlipidemia: Secondary | ICD-10-CM | POA: Diagnosis not present

## 2020-09-19 DIAGNOSIS — K219 Gastro-esophageal reflux disease without esophagitis: Secondary | ICD-10-CM | POA: Diagnosis not present

## 2020-09-19 DIAGNOSIS — M8580 Other specified disorders of bone density and structure, unspecified site: Secondary | ICD-10-CM | POA: Diagnosis not present

## 2020-09-19 DIAGNOSIS — F419 Anxiety disorder, unspecified: Secondary | ICD-10-CM | POA: Diagnosis not present

## 2020-09-19 DIAGNOSIS — E782 Mixed hyperlipidemia: Secondary | ICD-10-CM | POA: Diagnosis not present

## 2020-09-19 DIAGNOSIS — R7309 Other abnormal glucose: Secondary | ICD-10-CM | POA: Diagnosis not present

## 2020-09-19 DIAGNOSIS — R413 Other amnesia: Secondary | ICD-10-CM | POA: Diagnosis not present

## 2020-09-19 DIAGNOSIS — Z0000 Encounter for general adult medical examination without abnormal findings: Secondary | ICD-10-CM | POA: Diagnosis not present

## 2020-09-19 DIAGNOSIS — I10 Essential (primary) hypertension: Secondary | ICD-10-CM | POA: Diagnosis not present

## 2020-09-19 DIAGNOSIS — K6289 Other specified diseases of anus and rectum: Secondary | ICD-10-CM | POA: Diagnosis not present

## 2020-09-19 DIAGNOSIS — M1990 Unspecified osteoarthritis, unspecified site: Secondary | ICD-10-CM | POA: Diagnosis not present

## 2020-11-02 DIAGNOSIS — F419 Anxiety disorder, unspecified: Secondary | ICD-10-CM | POA: Diagnosis not present

## 2020-11-02 DIAGNOSIS — R030 Elevated blood-pressure reading, without diagnosis of hypertension: Secondary | ICD-10-CM | POA: Diagnosis not present

## 2020-11-02 DIAGNOSIS — G479 Sleep disorder, unspecified: Secondary | ICD-10-CM | POA: Diagnosis not present

## 2020-11-29 DIAGNOSIS — M8580 Other specified disorders of bone density and structure, unspecified site: Secondary | ICD-10-CM | POA: Diagnosis not present

## 2020-11-29 DIAGNOSIS — K219 Gastro-esophageal reflux disease without esophagitis: Secondary | ICD-10-CM | POA: Diagnosis not present

## 2020-11-29 DIAGNOSIS — I10 Essential (primary) hypertension: Secondary | ICD-10-CM | POA: Diagnosis not present

## 2020-11-29 DIAGNOSIS — G4700 Insomnia, unspecified: Secondary | ICD-10-CM | POA: Diagnosis not present

## 2020-11-29 DIAGNOSIS — M1990 Unspecified osteoarthritis, unspecified site: Secondary | ICD-10-CM | POA: Diagnosis not present

## 2020-11-29 DIAGNOSIS — E782 Mixed hyperlipidemia: Secondary | ICD-10-CM | POA: Diagnosis not present

## 2020-12-14 DIAGNOSIS — Z23 Encounter for immunization: Secondary | ICD-10-CM | POA: Diagnosis not present

## 2020-12-25 DIAGNOSIS — G894 Chronic pain syndrome: Secondary | ICD-10-CM | POA: Diagnosis not present

## 2020-12-25 DIAGNOSIS — M5136 Other intervertebral disc degeneration, lumbar region: Secondary | ICD-10-CM | POA: Diagnosis not present

## 2020-12-25 DIAGNOSIS — Z79891 Long term (current) use of opiate analgesic: Secondary | ICD-10-CM | POA: Diagnosis not present

## 2021-01-23 DIAGNOSIS — H43813 Vitreous degeneration, bilateral: Secondary | ICD-10-CM | POA: Diagnosis not present

## 2021-01-23 DIAGNOSIS — H26493 Other secondary cataract, bilateral: Secondary | ICD-10-CM | POA: Diagnosis not present

## 2021-01-23 DIAGNOSIS — Z961 Presence of intraocular lens: Secondary | ICD-10-CM | POA: Diagnosis not present

## 2021-01-23 DIAGNOSIS — H16223 Keratoconjunctivitis sicca, not specified as Sjogren's, bilateral: Secondary | ICD-10-CM | POA: Diagnosis not present

## 2021-02-08 ENCOUNTER — Encounter (HOSPITAL_COMMUNITY): Payer: Self-pay

## 2021-02-08 ENCOUNTER — Emergency Department (HOSPITAL_COMMUNITY): Payer: Medicare Other

## 2021-02-08 ENCOUNTER — Emergency Department (HOSPITAL_COMMUNITY)
Admission: EM | Admit: 2021-02-08 | Discharge: 2021-02-08 | Disposition: A | Discharge status: Home / Self Care | Payer: Medicare Other | Attending: Emergency Medicine | Admitting: Emergency Medicine

## 2021-02-08 ENCOUNTER — Other Ambulatory Visit: Payer: Self-pay

## 2021-02-08 DIAGNOSIS — Z5321 Procedure and treatment not carried out due to patient leaving prior to being seen by health care provider: Secondary | ICD-10-CM | POA: Insufficient documentation

## 2021-02-08 DIAGNOSIS — W01198A Fall on same level from slipping, tripping and stumbling with subsequent striking against other object, initial encounter: Secondary | ICD-10-CM | POA: Diagnosis not present

## 2021-02-08 DIAGNOSIS — S0990XA Unspecified injury of head, initial encounter: Secondary | ICD-10-CM | POA: Insufficient documentation

## 2021-02-08 DIAGNOSIS — W19XXXA Unspecified fall, initial encounter: Secondary | ICD-10-CM | POA: Diagnosis not present

## 2021-02-08 DIAGNOSIS — Z7901 Long term (current) use of anticoagulants: Secondary | ICD-10-CM | POA: Insufficient documentation

## 2021-02-08 DIAGNOSIS — I10 Essential (primary) hypertension: Secondary | ICD-10-CM | POA: Diagnosis not present

## 2021-02-08 DIAGNOSIS — J984 Other disorders of lung: Secondary | ICD-10-CM | POA: Diagnosis not present

## 2021-02-08 DIAGNOSIS — Z981 Arthrodesis status: Secondary | ICD-10-CM | POA: Diagnosis not present

## 2021-02-08 DIAGNOSIS — G319 Degenerative disease of nervous system, unspecified: Secondary | ICD-10-CM | POA: Diagnosis not present

## 2021-02-08 DIAGNOSIS — R4020 Unspecified coma: Secondary | ICD-10-CM | POA: Diagnosis not present

## 2021-02-08 DIAGNOSIS — R55 Syncope and collapse: Secondary | ICD-10-CM | POA: Diagnosis not present

## 2021-02-08 DIAGNOSIS — S199XXA Unspecified injury of neck, initial encounter: Secondary | ICD-10-CM | POA: Diagnosis not present

## 2021-02-08 DIAGNOSIS — I6529 Occlusion and stenosis of unspecified carotid artery: Secondary | ICD-10-CM | POA: Diagnosis not present

## 2021-02-08 LAB — CBC WITH DIFFERENTIAL/PLATELET
Abs Immature Granulocytes: 0.07 10*3/uL (ref 0.00–0.07)
Basophils Absolute: 0 10*3/uL (ref 0.0–0.1)
Basophils Relative: 0 %
Eosinophils Absolute: 0.1 10*3/uL (ref 0.0–0.5)
Eosinophils Relative: 1 %
HCT: 39 % (ref 36.0–46.0)
Hemoglobin: 13.6 g/dL (ref 12.0–15.0)
Immature Granulocytes: 1 %
Lymphocytes Relative: 10 %
Lymphs Abs: 1 10*3/uL (ref 0.7–4.0)
MCH: 31.8 pg (ref 26.0–34.0)
MCHC: 34.9 g/dL (ref 30.0–36.0)
MCV: 91.1 fL (ref 80.0–100.0)
Monocytes Absolute: 0.8 10*3/uL (ref 0.1–1.0)
Monocytes Relative: 8 %
Neutro Abs: 7.5 10*3/uL (ref 1.7–7.7)
Neutrophils Relative %: 80 %
Platelets: 320 10*3/uL (ref 150–400)
RBC: 4.28 MIL/uL (ref 3.87–5.11)
RDW: 13 % (ref 11.5–15.5)
WBC: 9.4 10*3/uL (ref 4.0–10.5)
nRBC: 0 % (ref 0.0–0.2)

## 2021-02-08 LAB — URINALYSIS, MICROSCOPIC (REFLEX)

## 2021-02-08 LAB — BASIC METABOLIC PANEL
Anion gap: 6 (ref 5–15)
BUN: 15 mg/dL (ref 8–23)
CO2: 29 mmol/L (ref 22–32)
Calcium: 9 mg/dL (ref 8.9–10.3)
Chloride: 90 mmol/L — ABNORMAL LOW (ref 98–111)
Creatinine, Ser: 1.23 mg/dL — ABNORMAL HIGH (ref 0.44–1.00)
GFR, Estimated: 44 mL/min — ABNORMAL LOW (ref >60)
Glucose, Bld: 107 mg/dL — ABNORMAL HIGH (ref 70–99)
Potassium: 4.3 mmol/L (ref 3.5–5.1)
Sodium: 125 mmol/L — ABNORMAL LOW (ref 135–145)

## 2021-02-08 LAB — URINALYSIS, ROUTINE W REFLEX MICROSCOPIC
Bilirubin Urine: NEGATIVE
Glucose, UA: NEGATIVE mg/dL
Hgb urine dipstick: NEGATIVE
Ketones, ur: NEGATIVE mg/dL
Nitrite: NEGATIVE
Protein, ur: NEGATIVE mg/dL
Specific Gravity, Urine: 1.02 (ref 1.005–1.030)
pH: 6 (ref 5.0–8.0)

## 2021-02-08 NOTE — ED Triage Notes (Addendum)
Pt BIB [facility] for eval of fall. Pt fell backwards from standing and strcuk head against cabinet. Unconscious for about 3 minutes per husband. Unsure if syncopal episode then fall or fall and then loss of consciousness per EMS. Bump to posterior head. Confused at baseline d/t hx of dementia. Repetitive questioning which husband reports is also baseline \

## 2021-02-08 NOTE — ED Notes (Signed)
No answer from pt in waiting roomo.

## 2021-02-08 NOTE — ED Provider Notes (Signed)
Emergency Medicine Provider Triage Evaluation Note  Brooke Weber , a 80 y.o. female  was evaluated in triage.  Pt complains of fall.  Brought in by EMS.  Patient is reportedly at baseline mental status per husband, alert to person, birthdate, states it is 11 and pushes the present, aware that it is December and about to celebrate Christmas).  Patient's husband states that patient got out of the lounge chair and went to the bathroom to brush her teeth and then came to the kitchen and got some prescription bottles out of the cabinet.  Patient's husband was standing right next to her when she then fell backwards and hit the back of her head on the cabinet.  Husband is unsure if she passed out before she fell or when she hit her head, states that she was unconscious for about 3 minutes.  Patient is not on blood thinners.  Husband states that is not unusual for them to be up at this hour however she does not take medication typically before going to bed.  Patient reports pain to the back of her head.  Patient denies any other injuries, complaints or concerns.  Review of Systems  Positive: Posterior head pain Negative: Neck pain, extremity injury, chest pain  Physical Exam  BP (!) 162/70 (BP Location: Left Arm)   Pulse (!) 55   Temp (!) 97.5 F (36.4 C) (Oral)   Resp 18   SpO2 98%  Gen:   Awake, no distress   Resp:  Normal effort  MSK:   Moves extremities without difficulty  Other:  C-collar in place, no midline tenderness or step offs. Hematoma to posterior scalp. No pain with palpation/ROM extremities   Medical Decision Making  Medically screening exam initiated at 3:17 AM.  Appropriate orders placed.  CATERIN TABARES was informed that the remainder of the evaluation will be completed by another provider, this initial triage assessment does not replace that evaluation, and the importance of remaining in the ED until their evaluation is complete.     Tacy Learn, PA-C 02/08/21  0330    Maudie Flakes, MD 02/08/21 [phone]

## 2021-03-12 ENCOUNTER — Encounter (HOSPITAL_COMMUNITY): Payer: Self-pay | Admitting: Radiology

## 2021-03-20 DIAGNOSIS — G3184 Mild cognitive impairment, so stated: Secondary | ICD-10-CM | POA: Diagnosis not present

## 2021-03-20 DIAGNOSIS — F419 Anxiety disorder, unspecified: Secondary | ICD-10-CM | POA: Diagnosis not present

## 2021-04-23 DIAGNOSIS — G894 Chronic pain syndrome: Secondary | ICD-10-CM | POA: Diagnosis not present

## 2021-04-23 DIAGNOSIS — M5136 Other intervertebral disc degeneration, lumbar region: Secondary | ICD-10-CM | POA: Diagnosis not present

## 2021-04-23 DIAGNOSIS — Z79891 Long term (current) use of opiate analgesic: Secondary | ICD-10-CM | POA: Diagnosis not present

## 2021-05-08 DIAGNOSIS — R112 Nausea with vomiting, unspecified: Secondary | ICD-10-CM | POA: Diagnosis not present

## 2021-08-21 DIAGNOSIS — G894 Chronic pain syndrome: Secondary | ICD-10-CM | POA: Diagnosis not present

## 2021-08-21 DIAGNOSIS — Z79891 Long term (current) use of opiate analgesic: Secondary | ICD-10-CM | POA: Diagnosis not present

## 2021-08-21 DIAGNOSIS — Z5181 Encounter for therapeutic drug level monitoring: Secondary | ICD-10-CM | POA: Diagnosis not present

## 2021-08-21 DIAGNOSIS — M5136 Other intervertebral disc degeneration, lumbar region: Secondary | ICD-10-CM | POA: Diagnosis not present

## 2021-09-17 ENCOUNTER — Other Ambulatory Visit: Payer: Self-pay

## 2021-09-17 ENCOUNTER — Emergency Department (HOSPITAL_COMMUNITY): Payer: Medicare Other

## 2021-09-17 ENCOUNTER — Emergency Department (HOSPITAL_COMMUNITY)
Admission: EM | Admit: 2021-09-17 | Discharge: 2021-09-17 | Discharge status: Against Medical Advice | Payer: Medicare Other | Attending: Emergency Medicine | Admitting: Emergency Medicine

## 2021-09-17 ENCOUNTER — Encounter (HOSPITAL_COMMUNITY): Payer: Self-pay | Admitting: *Deleted

## 2021-09-17 DIAGNOSIS — W108XXA Fall (on) (from) other stairs and steps, initial encounter: Secondary | ICD-10-CM | POA: Insufficient documentation

## 2021-09-17 DIAGNOSIS — Y990 Civilian activity done for income or pay: Secondary | ICD-10-CM | POA: Diagnosis not present

## 2021-09-17 DIAGNOSIS — Z5321 Procedure and treatment not carried out due to patient leaving prior to being seen by health care provider: Secondary | ICD-10-CM | POA: Insufficient documentation

## 2021-09-17 DIAGNOSIS — S0990XA Unspecified injury of head, initial encounter: Secondary | ICD-10-CM | POA: Diagnosis not present

## 2021-09-17 NOTE — ED Notes (Signed)
Patient states she will follow up with primary care doctor

## 2021-09-17 NOTE — ED Provider Triage Note (Addendum)
Emergency Medicine Provider Triage Evaluation Note  Brooke Weber , a 81 y.o. female  was evaluated in triage.  Pt complains of fall possibly yesterday. Denies any blurry vision. The patient reports that she missed a step and fell hitting the side of her head. She denies LOC.  Questionable historian. The patient reported her husband left because he can't drive at work, but he is supposed to be on his way back over. Called cell phone without an answer.   Review of Systems  Positive:  Negative:  Physical Exam  BP 134/78 (BP Location: Left Leg)   Pulse 63   Temp 98.3 F (36.8 C) (Oral)   Resp 18   SpO2 96%  Gen:   Awake, no distress   Resp:  Normal effort  MSK:   Moves extremities without difficulty  Other:  A&O x2. Not aware to time. Patient repeating stories. The patient as a small skin tear noted above the left orbital with swelling. No battle signs or raccoon signs. No midline tenderness. No midline tenderness. Some mild tenderness to the left knee.   Medical Decision Making  Medically screening exam initiated at 8:09 PM.  Appropriate orders placed.  Brooke Weber was informed that the remainder of the evaluation will be completed by another provider, this initial triage assessment does not replace that evaluation, and the importance of remaining in the ED until their evaluation is complete.  Unsure if this is her baseline. No family member at bedside or is reachable by phone.  I do have that the patient has a h/o mild cognitive impairment in her chart. Will order CT head and neck.    Sherrell Puller, PA-C 09/17/21 2014    Sherrell Puller, PA-C 09/17/21 2016

## 2021-09-17 NOTE — ED Triage Notes (Signed)
Pt says that she was going up the steps, "lost my balance" and missed the step (concrete), hit the left side of her head on the step. Denies LOC, denies blood thinner use. Bruising/skin tear to the left forehead area just above the lateral eye. Alert and oriented.

## 2021-09-25 ENCOUNTER — Emergency Department (HOSPITAL_BASED_OUTPATIENT_CLINIC_OR_DEPARTMENT_OTHER): Payer: Medicare Other

## 2021-09-25 ENCOUNTER — Inpatient Hospital Stay (HOSPITAL_BASED_OUTPATIENT_CLINIC_OR_DEPARTMENT_OTHER)
Admission: EM | Admit: 2021-09-25 | Discharge: 2021-09-28 | DRG: 641 | Disposition: A | Discharge status: Home Health | Payer: Medicare Other | Attending: Internal Medicine | Admitting: Internal Medicine

## 2021-09-25 ENCOUNTER — Encounter (HOSPITAL_BASED_OUTPATIENT_CLINIC_OR_DEPARTMENT_OTHER): Payer: Self-pay | Admitting: Emergency Medicine

## 2021-09-25 ENCOUNTER — Other Ambulatory Visit: Payer: Self-pay

## 2021-09-25 DIAGNOSIS — R296 Repeated falls: Secondary | ICD-10-CM | POA: Diagnosis present

## 2021-09-25 DIAGNOSIS — Z79891 Long term (current) use of opiate analgesic: Secondary | ICD-10-CM | POA: Diagnosis not present

## 2021-09-25 DIAGNOSIS — Z79899 Other long term (current) drug therapy: Secondary | ICD-10-CM

## 2021-09-25 DIAGNOSIS — E871 Hypo-osmolality and hyponatremia: Principal | ICD-10-CM | POA: Diagnosis present

## 2021-09-25 DIAGNOSIS — Z880 Allergy status to penicillin: Secondary | ICD-10-CM

## 2021-09-25 DIAGNOSIS — B9620 Unspecified Escherichia coli [E. coli] as the cause of diseases classified elsewhere: Secondary | ICD-10-CM | POA: Diagnosis present

## 2021-09-25 DIAGNOSIS — S50312A Abrasion of left elbow, initial encounter: Secondary | ICD-10-CM | POA: Diagnosis not present

## 2021-09-25 DIAGNOSIS — Z888 Allergy status to other drugs, medicaments and biological substances status: Secondary | ICD-10-CM

## 2021-09-25 DIAGNOSIS — S0181XA Laceration without foreign body of other part of head, initial encounter: Secondary | ICD-10-CM | POA: Diagnosis not present

## 2021-09-25 DIAGNOSIS — Z886 Allergy status to analgesic agent status: Secondary | ICD-10-CM | POA: Diagnosis not present

## 2021-09-25 DIAGNOSIS — Z043 Encounter for examination and observation following other accident: Secondary | ICD-10-CM | POA: Diagnosis not present

## 2021-09-25 DIAGNOSIS — Z91018 Allergy to other foods: Secondary | ICD-10-CM | POA: Diagnosis not present

## 2021-09-25 DIAGNOSIS — N390 Urinary tract infection, site not specified: Secondary | ICD-10-CM | POA: Diagnosis not present

## 2021-09-25 DIAGNOSIS — I10 Essential (primary) hypertension: Secondary | ICD-10-CM | POA: Diagnosis present

## 2021-09-25 DIAGNOSIS — R55 Syncope and collapse: Secondary | ICD-10-CM

## 2021-09-25 DIAGNOSIS — R634 Abnormal weight loss: Secondary | ICD-10-CM | POA: Diagnosis not present

## 2021-09-25 DIAGNOSIS — S0512XA Contusion of eyeball and orbital tissues, left eye, initial encounter: Secondary | ICD-10-CM | POA: Diagnosis not present

## 2021-09-25 DIAGNOSIS — E782 Mixed hyperlipidemia: Secondary | ICD-10-CM | POA: Diagnosis not present

## 2021-09-25 DIAGNOSIS — Z91030 Bee allergy status: Secondary | ICD-10-CM

## 2021-09-25 DIAGNOSIS — R7309 Other abnormal glucose: Secondary | ICD-10-CM | POA: Diagnosis not present

## 2021-09-25 DIAGNOSIS — S40022A Contusion of left upper arm, initial encounter: Secondary | ICD-10-CM | POA: Diagnosis not present

## 2021-09-25 DIAGNOSIS — W19XXXA Unspecified fall, initial encounter: Secondary | ICD-10-CM | POA: Diagnosis not present

## 2021-09-25 DIAGNOSIS — S0003XA Contusion of scalp, initial encounter: Secondary | ICD-10-CM | POA: Diagnosis not present

## 2021-09-25 DIAGNOSIS — K219 Gastro-esophageal reflux disease without esophagitis: Secondary | ICD-10-CM | POA: Diagnosis not present

## 2021-09-25 DIAGNOSIS — R269 Unspecified abnormalities of gait and mobility: Secondary | ICD-10-CM | POA: Diagnosis not present

## 2021-09-25 DIAGNOSIS — G894 Chronic pain syndrome: Secondary | ICD-10-CM | POA: Diagnosis present

## 2021-09-25 DIAGNOSIS — G3184 Mild cognitive impairment, so stated: Secondary | ICD-10-CM | POA: Diagnosis present

## 2021-09-25 DIAGNOSIS — E876 Hypokalemia: Secondary | ICD-10-CM | POA: Diagnosis not present

## 2021-09-25 DIAGNOSIS — Z1611 Resistance to penicillins: Secondary | ICD-10-CM | POA: Diagnosis not present

## 2021-09-25 DIAGNOSIS — R413 Other amnesia: Secondary | ICD-10-CM | POA: Diagnosis not present

## 2021-09-25 DIAGNOSIS — S069X9A Unspecified intracranial injury with loss of consciousness of unspecified duration, initial encounter: Secondary | ICD-10-CM | POA: Diagnosis not present

## 2021-09-25 HISTORY — DX: Unspecified fall, initial encounter: W19.XXXA

## 2021-09-25 LAB — BASIC METABOLIC PANEL
Anion gap: 12 (ref 5–15)
BUN: 11 mg/dL (ref 8–23)
CO2: 27 mmol/L (ref 22–32)
Calcium: 9.3 mg/dL (ref 8.9–10.3)
Chloride: 86 mmol/L — ABNORMAL LOW (ref 98–111)
Creatinine, Ser: 0.72 mg/dL (ref 0.44–1.00)
GFR, Estimated: >60 mL/min (ref >60)
Glucose, Bld: 129 mg/dL — ABNORMAL HIGH (ref 70–99)
Potassium: 3.3 mmol/L — ABNORMAL LOW (ref 3.5–5.1)
Sodium: 125 mmol/L — ABNORMAL LOW (ref 135–145)

## 2021-09-25 LAB — CBC
HCT: 35.9 % — ABNORMAL LOW (ref 36.0–46.0)
Hemoglobin: 12.7 g/dL (ref 12.0–15.0)
MCH: 31.4 pg (ref 26.0–34.0)
MCHC: 35.4 g/dL (ref 30.0–36.0)
MCV: 88.6 fL (ref 80.0–100.0)
Platelets: 347 10*3/uL (ref 150–400)
RBC: 4.05 MIL/uL (ref 3.87–5.11)
RDW: 12.6 % (ref 11.5–15.5)
WBC: 9.5 10*3/uL (ref 4.0–10.5)
nRBC: 0 % (ref 0.0–0.2)

## 2021-09-25 LAB — NA AND K (SODIUM & POTASSIUM), RAND UR
Potassium Urine: 47 mmol/L
Sodium, Ur: 51 mmol/L

## 2021-09-25 LAB — URINALYSIS, ROUTINE W REFLEX MICROSCOPIC
Bilirubin Urine: NEGATIVE
Glucose, UA: NEGATIVE mg/dL
Hgb urine dipstick: NEGATIVE
Ketones, ur: NEGATIVE mg/dL
Nitrite: NEGATIVE
Protein, ur: NEGATIVE mg/dL
Specific Gravity, Urine: 1.014 (ref 1.005–1.030)
pH: 6 (ref 5.0–8.0)

## 2021-09-25 LAB — TROPONIN I (HIGH SENSITIVITY): Troponin I (High Sensitivity): 3 ng/L (ref <18)

## 2021-09-25 LAB — OSMOLALITY, URINE: Osmolality, Ur: 402 mOsm/kg (ref 300–900)

## 2021-09-25 LAB — TSH: TSH: 0.565 u[IU]/mL (ref 0.350–4.500)

## 2021-09-25 LAB — CREATININE, URINE, RANDOM: Creatinine, Urine: 134 mg/dL

## 2021-09-25 LAB — OSMOLALITY: Osmolality: 270 mOsm/kg — ABNORMAL LOW (ref 275–295)

## 2021-09-25 MED ORDER — OXYCODONE HCL 5 MG PO TABS
5.0000 mg | ORAL_TABLET | Freq: Once | ORAL | Status: AC
  Administered 2021-09-26: 5 mg via ORAL
  Filled 2021-09-25: qty 1

## 2021-09-25 MED ORDER — SODIUM CHLORIDE 0.9 % IV BOLUS
500.0000 mL | Freq: Once | INTRAVENOUS | Status: AC
Start: 2021-09-25 — End: 2021-09-26
  Administered 2021-09-25: 500 mL via INTRAVENOUS

## 2021-09-25 MED ORDER — SODIUM CHLORIDE 0.9 % IV BOLUS: 500.0000 mL | Freq: Once | INTRAVENOUS | Status: DC

## 2021-09-25 NOTE — ED Triage Notes (Signed)
Seen today by dr. Harrington Challenger (PCP) labs drawn, called by PCP to go to ED for low sodium level. Feels faint sometimes and has had recent falls

## 2021-09-25 NOTE — ED Provider Notes (Signed)
[facility] EMERGENCY DEPT Provider Note   CSN: [id number] Arrival date & time: 09/25/21  1758     History  Chief Complaint  Patient presents with   Abnormal Lab    Brooke Weber is a 81 y.o. female.  Patient as above with significant medical history as below, including hypertension, arthritis, recurrent falls who presents to the ED with complaint of abnormal sodium.  Patient accompanied by spouse.  Spouse reports that patient has had frequent falls in the past, she last had a fall around 7 days ago, they went to the emergency department but left due to the long waiting time.  Patient was seen by PCP yesterday, was told reports emergency department secondary to low sodium.  This lab value is not available to review but she did have hyponatremia some months ago sodium 125.  It appears that at that point patient did have a fall and reported to emergency room but left prior to patient work-up.  Spouse's report patient has been more confused over the past week.  Repetitive questioning, feeling off balance or unsteady with her ambulation.  Occasionally asking abnormal questions.  When the patient fell last week he was unwitnessed, she does report LOC with head injury.  Patient has not had any repeat falls since last week.  No nausea, vomiting, diarrhea, fevers, chills.  Does report mildly reduced appetite/p.o. intake.  She is on HCTZ.    Past Medical History:  Diagnosis Date   Arthritis    Fall    Hypertension     Past Surgical History:  Procedure Laterality Date   ABDOMINAL HYSTERECTOMY  1978   Partial   BACK SURGERY  2003   cervial fusion four discs   CHOLECYSTECTOMY  2008   JOINT REPLACEMENT Right 1996   OPEN REDUCTION INTERNAL FIXATION (ORIF) PROXIMAL PHALANX Right 02/18/2017   Procedure: CLOSED REDUCTION PINNING RIGHT INDEX PROXIMAL PHALANX FRACTURE;  Surgeon: Roseanne Kaufman, MD;  Location: [facility];  Service: Orthopedics;  Laterality: Right;  [place] SURGERY  2008   [place]     The history is provided by the patient and the spouse. The history is limited by the condition of the patient. No language interpreter was used.  Abnormal Lab      Home Medications Prior to Admission medications   Medication Sig Start Date End Date Taking? Authorizing Provider  ALPRAZolam Duanne Moron) 0.5 MG tablet Take 0.25 mg by mouth daily as needed for anxiety.     [provider]  atenolol (TENORMIN) 100 MG tablet Take 100 mg by mouth at bedtime. 01/15/14   [provider]  augmented betamethasone dipropionate (DIPROLENE-AF) 0.05 % cream Apply 1 application topically daily as needed (eczema).  01/01/17   [provider]  lisinopril-hydrochlorothiazide (PRINZIDE,ZESTORETIC) 20-12.5 MG per tablet Take 1 tablet by mouth daily. 01/15/14   [provider]  omeprazole (PRILOSEC) 20 MG capsule Take 20 mg by mouth daily.    [provider]  oxyCODONE-acetaminophen (PERCOCET) 10-325 MG per tablet Take 1 tablet by mouth every 6 (six) hours as needed for pain.  02/18/14   [provider]  Polyethyl Glycol-Propyl Glycol (SYSTANE) 0.4-0.3 % SOLN Place 2 drops into both eyes 3 (three) times daily.    [provider]  polyethylene glycol (MIRALAX / GLYCOLAX) packet Take 17 g by mouth daily.    [provider]  PROCTOSOL HC 2.5 % rectal cream Place 1 application rectally daily as needed for anal itching.  12/10/16  [provider]      Allergies    Aspirin, Bee venom, Iodine, Other, and Penicillins    Review of Systems   Review of Systems  Constitutional:  Negative for activity change and fever.  HENT:  Negative for facial swelling and trouble swallowing.   Eyes:  Negative for discharge and redness.  Respiratory:  Negative for cough and shortness of breath.   Cardiovascular:  Negative for chest pain and palpitations.  Gastrointestinal:  Negative for abdominal pain and nausea.   Genitourinary:  Negative for dysuria and flank pain.  Musculoskeletal:  Negative for back pain and gait problem.  Skin:  Negative for pallor and rash.  Neurological:  Positive for syncope and headaches.  Psychiatric/Behavioral:  Positive for confusion and decreased concentration.     Physical Exam Updated Vital Signs BP (!) 187/72   Pulse 60   Temp 97.9 F (36.6 C)   Resp 18   SpO2 100%  Physical Exam Vitals and nursing note reviewed.  Constitutional:      General: She is not in acute distress.    Appearance: Normal appearance.  HENT:     Head: Normocephalic. No right periorbital erythema or left periorbital erythema.     Jaw: There is normal jaw occlusion. No trismus.     Comments: Hematoma left orbit    Right Ear: External ear normal.     Left Ear: External ear normal.     Nose: Nose normal.     Mouth/Throat:     Mouth: Mucous membranes are moist.  Eyes:     General: No scleral icterus.       Right eye: No discharge.        Left eye: No discharge.  Cardiovascular:     Rate and Rhythm: Normal rate and regular rhythm.     Pulses: Normal pulses.     Heart sounds: Normal heart sounds.  Pulmonary:     Effort: Pulmonary effort is normal. No respiratory distress.     Breath sounds: Normal breath sounds.  Abdominal:     General: Abdomen is flat.     Tenderness: There is no abdominal tenderness.  Musculoskeletal:        General: Normal range of motion.     Cervical back: Full passive range of motion without pain and normal range of motion.     Right lower leg: No edema.     Left lower leg: No edema.  Skin:    General: Skin is warm and dry.     Capillary Refill: Capillary refill takes less than 2 seconds.  Neurological:     Mental Status: She is alert and oriented to person, place, and time.     GCS: GCS eye subscore is 4. GCS verbal subscore is 5. GCS motor subscore is 6.     Cranial Nerves: Cranial nerves 2-12 are intact. No dysarthria or facial asymmetry.      Sensory: Sensation is intact.     Motor: Motor function is intact. No tremor.     Coordination: Coordination is intact. Finger-Nose-Finger Test normal.     Gait: Gait is intact.     Comments: Patient with repetitive questioning  Psychiatric:        Mood and Affect: Mood normal.        Behavior: Behavior normal.     ED Results / Procedures / Treatments   Labs (all labs ordered are listed, but only abnormal results are displayed) Labs Reviewed  BASIC METABOLIC PANEL -  Abnormal; Notable for the following components:      Result Value   Sodium 125 (*)    Potassium 3.3 (*)    Chloride 86 (*)    Glucose, Bld 129 (*)    All other components within normal limits  CBC - Abnormal; Notable for the following components:   HCT 35.9 (*)    All other components within normal limits  URINALYSIS, ROUTINE W REFLEX MICROSCOPIC - Abnormal; Notable for the following components:   APPearance HAZY (*)    Leukocytes,Ua LARGE (*)    Bacteria, UA MANY (*)    Non Squamous Epithelial 0-5 (*)    All other components within normal limits  OSMOLALITY - Abnormal; Notable for the following components:   Osmolality 270 (*)    All other components within normal limits  URINE CULTURE  NA AND K (SODIUM & POTASSIUM), RAND UR  CREATININE, URINE, RANDOM  OSMOLALITY, URINE  TSH  UREA NITROGEN, URINE  BASIC METABOLIC PANEL  TROPONIN I (HIGH SENSITIVITY)  TROPONIN I (HIGH SENSITIVITY)    EKG EKG Interpretation  Date/Time:  Tuesday September 25 2021 19:56:31 EDT Ventricular Rate:  56 PR Interval:  166 QRS Duration: 135 QT Interval:  497 QTC Calculation: 480 R Axis:   17 Text Interpretation: Sinus rhythm IVCD, consider atypical RBBB similar to prior Confirmed by Wynona Dove ([id number]) on 09/26/2021 12:01:48 AM  Radiology CT Head Wo Contrast  Result Date: 09/25/2021 CLINICAL DATA:  Minor head trauma. Multiple recent falls. Bruising around the left eye and forehead. EXAM: CT HEAD WITHOUT CONTRAST CT MAXILLOFACIAL  WITHOUT CONTRAST CT CERVICAL SPINE WITHOUT CONTRAST TECHNIQUE: Multidetector CT imaging of the head, cervical spine, and maxillofacial structures were performed using the standard protocol without intravenous contrast. Multiplanar CT image reconstructions of the cervical spine and maxillofacial structures were also generated. RADIATION DOSE REDUCTION: This exam was performed according to the departmental dose-optimization program which includes automated exposure control, adjustment of the mA and/or kV according to patient size and/or use of iterative reconstruction technique. COMPARISON:  CT head and cervical spine 02/08/2021 FINDINGS: CT HEAD FINDINGS Brain: Diffuse cerebral atrophy. Ventricular dilatation consistent with central atrophy. Low-attenuation changes in the deep white matter consistent with small vessel ischemia. No abnormal extra-axial fluid collections. No mass effect or midline shift. [place]-white matter junctions are distinct. Basal cisterns are not effaced. No acute intracranial hemorrhage. Vascular: No hyperdense vessel or unexpected calcification. Skull: Calvarium appears intact. Other: None. CT MAXILLOFACIAL FINDINGS Osseous: No fracture or mandibular dislocation. No destructive process. Poor dentition. Orbits: Negative. No traumatic or inflammatory finding. Sinuses: Clear. Soft tissues: Mild subcutaneous scalp hematoma over the left supraorbital region. Metallic structure demonstrated in the floor of the oral cavity towards the left probably representing a piercing. CT CERVICAL SPINE FINDINGS Alignment: Normal alignment. Skull base and vertebrae: Skull base appears intact. No vertebral compression. No focal bone lesion or bone destruction. Soft tissues and spinal canal: No prevertebral soft tissue swelling. No abnormal paraspinal soft tissue mass or infiltration. Disc levels: Postoperative changes with anterior plate and screw fixation and intervertebral fusion from C4 through C7. Fused segments  appear intact. Surgical hardware appears intact without change since prior study. Upper chest: Lung apices are clear. Other: None. IMPRESSION: 1. No acute intracranial abnormalities. Chronic atrophy and small vessel ischemic changes. 2. No acute displaced orbital or facial fractures identified. 3. Postoperative anterior fixation with intervertebral fusion from C4 through C7. Normal alignment of the cervical spine. No acute displaced fractures identified. Electronically Signed  By: Lucienne Capers M.D.   On: 09/25/2021 21:44   CT Cervical Spine Wo Contrast  Result Date: 09/25/2021 CLINICAL DATA:  Minor head trauma. Multiple recent falls. Bruising around the left eye and forehead. EXAM: CT HEAD WITHOUT CONTRAST CT MAXILLOFACIAL WITHOUT CONTRAST CT CERVICAL SPINE WITHOUT CONTRAST TECHNIQUE: Multidetector CT imaging of the head, cervical spine, and maxillofacial structures were performed using the standard protocol without intravenous contrast. Multiplanar CT image reconstructions of the cervical spine and maxillofacial structures were also generated. RADIATION DOSE REDUCTION: This exam was performed according to the departmental dose-optimization program which includes automated exposure control, adjustment of the mA and/or kV according to patient size and/or use of iterative reconstruction technique. COMPARISON:  CT head and cervical spine 02/08/2021 FINDINGS: CT HEAD FINDINGS Brain: Diffuse cerebral atrophy. Ventricular dilatation consistent with central atrophy. Low-attenuation changes in the deep white matter consistent with small vessel ischemia. No abnormal extra-axial fluid collections. No mass effect or midline shift. Honestii Marton-white matter junctions are distinct. Basal cisterns are not effaced. No acute intracranial hemorrhage. Vascular: No hyperdense vessel or unexpected calcification. Skull: Calvarium appears intact. Other: None. CT MAXILLOFACIAL FINDINGS Osseous: No fracture or mandibular dislocation. No  destructive process. Poor dentition. Orbits: Negative. No traumatic or inflammatory finding. Sinuses: Clear. Soft tissues: Mild subcutaneous scalp hematoma over the left supraorbital region. Metallic structure demonstrated in the floor of the oral cavity towards the left probably representing a piercing. CT CERVICAL SPINE FINDINGS Alignment: Normal alignment. Skull base and vertebrae: Skull base appears intact. No vertebral compression. No focal bone lesion or bone destruction. Soft tissues and spinal canal: No prevertebral soft tissue swelling. No abnormal paraspinal soft tissue mass or infiltration. Disc levels: Postoperative changes with anterior plate and screw fixation and intervertebral fusion from C4 through C7. Fused segments appear intact. Surgical hardware appears intact without change since prior study. Upper chest: Lung apices are clear. Other: None. IMPRESSION: 1. No acute intracranial abnormalities. Chronic atrophy and small vessel ischemic changes. 2. No acute displaced orbital or facial fractures identified. 3. Postoperative anterior fixation with intervertebral fusion from C4 through C7. Normal alignment of the cervical spine. No acute displaced fractures identified. Electronically Signed   By: Lucienne Capers M.D.   On: 09/25/2021 21:44   CT Maxillofacial Wo Contrast  Result Date: 09/25/2021 CLINICAL DATA:  Minor head trauma. Multiple recent falls. Bruising around the left eye and forehead. EXAM: CT HEAD WITHOUT CONTRAST CT MAXILLOFACIAL WITHOUT CONTRAST CT CERVICAL SPINE WITHOUT CONTRAST TECHNIQUE: Multidetector CT imaging of the head, cervical spine, and maxillofacial structures were performed using the standard protocol without intravenous contrast. Multiplanar CT image reconstructions of the cervical spine and maxillofacial structures were also generated. RADIATION DOSE REDUCTION: This exam was performed according to the departmental dose-optimization program which includes automated  exposure control, adjustment of the mA and/or kV according to patient size and/or use of iterative reconstruction technique. COMPARISON:  CT head and cervical spine 02/08/2021 FINDINGS: CT HEAD FINDINGS Brain: Diffuse cerebral atrophy. Ventricular dilatation consistent with central atrophy. Low-attenuation changes in the deep white matter consistent with small vessel ischemia. No abnormal extra-axial fluid collections. No mass effect or midline shift. Lorinda Copland-white matter junctions are distinct. Basal cisterns are not effaced. No acute intracranial hemorrhage. Vascular: No hyperdense vessel or unexpected calcification. Skull: Calvarium appears intact. Other: None. CT MAXILLOFACIAL FINDINGS Osseous: No fracture or mandibular dislocation. No destructive process. Poor dentition. Orbits: Negative. No traumatic or inflammatory finding. Sinuses: Clear. Soft tissues: Mild subcutaneous scalp hematoma over the left supraorbital region. Metallic  structure demonstrated in the floor of the oral cavity towards the left probably representing a piercing. CT CERVICAL SPINE FINDINGS Alignment: Normal alignment. Skull base and vertebrae: Skull base appears intact. No vertebral compression. No focal bone lesion or bone destruction. Soft tissues and spinal canal: No prevertebral soft tissue swelling. No abnormal paraspinal soft tissue mass or infiltration. Disc levels: Postoperative changes with anterior plate and screw fixation and intervertebral fusion from C4 through C7. Fused segments appear intact. Surgical hardware appears intact without change since prior study. Upper chest: Lung apices are clear. Other: None. IMPRESSION: 1. No acute intracranial abnormalities. Chronic atrophy and small vessel ischemic changes. 2. No acute displaced orbital or facial fractures identified. 3. Postoperative anterior fixation with intervertebral fusion from C4 through C7. Normal alignment of the cervical spine. No acute displaced fractures identified.  Electronically Signed   By: Lucienne Capers M.D.   On: 09/25/2021 21:44   DG Chest Portable 1 View  Result Date: 09/25/2021 CLINICAL DATA:  LOC EXAM: PORTABLE CHEST 1 VIEW COMPARISON:  03/03/2014 FINDINGS: Hardware in the cervicothoracic spine. No acute airspace disease or effusion. Stable cardiomediastinal silhouette with aortic atherosclerosis. No pneumothorax. Possible age indeterminate left humeral neck fracture, question deformity at the left humerus on 2015 comparison IMPRESSION: 1. Clear lung fields. 2. Possible age indeterminate left humeral neck fracture, correlate for point tenderness with follow-up dedicated shoulder radiographs if indicated. Electronically Signed   By: Donavan Foil M.D.   On: 09/25/2021 21:42    Procedures .Critical Care  Performed by: Jeanell Sparrow, DO Authorized by: Jeanell Sparrow, DO   Critical care provider statement:    Critical care time (minutes):  30   Critical care time was exclusive of:  Separately billable procedures and treating other patients   Critical care was necessary to treat or prevent imminent or life-threatening deterioration of the following conditions:  Metabolic crisis   Critical care was time spent personally by me on the following activities:  Development of treatment plan with patient or surrogate, discussions with consultants, evaluation of patient's response to treatment, examination of patient, ordering and review of laboratory studies, ordering and review of radiographic studies, ordering and performing treatments and interventions, pulse oximetry, re-evaluation of patient's condition, review of old charts and obtaining history from patient or surrogate   Care discussed with: admitting provider       Medications Ordered in ED Medications  oxyCODONE (Oxy IR/ROXICODONE) immediate release tablet 5 mg (has no administration in time range)  sodium chloride 0.9 % bolus 500 mL (500 mLs Intravenous New Bag/Given 09/25/21 2147)    ED  Course/ Medical Decision Making/ A&P Clinical Course as of 09/26/21 0003  Tue Sep 25, 2021  2247 She is on HCTZ, potential source of her HypoNa [SG]    Clinical Course User Index [SG] Jeanell Sparrow, DO                           Medical Decision Making Amount and/or Complexity of Data Reviewed Labs: ordered. Radiology: ordered.  Risk Decision regarding hospitalization.    CC: Fall, low sodium  This patient presents to the Emergency Department for the above complaint. This involves an extensive number of treatment options and is a complaint that carries with it a high risk of complications and morbidity. Vital signs were reviewed. Serious etiologies considered.  Differential diagnoses for head trauma includes subdural hematoma, epidural hematoma, acute concussion, traumatic subarachnoid hemorrhage, cerebral contusions, etc.  Differential diagnoses for altered mental status includes but is not exclusive to alcohol, illicit or prescription medications, intracranial pathology such as stroke, intracerebral hemorrhage, fever or infectious causes including sepsis, hypoxemia, uremia, trauma, endocrine related disorders such as diabetes, hypoglycemia, thyroid-related diseases, etc.  Record review:  Previous records obtained and reviewed prior ED visits, prior labs/imaging  Additional history obtained from spouse  Medical and surgical history as noted above.   Work up as above, notable for:  Labs & imaging results that were available during my care of the patient were visualized by me and considered in my medical decision making.  Physical exam as above.   I ordered imaging studies which included CT head, CT max face, CT C-spine, chest x-ray. I visualized the imaging, interpreted images, and I agree with radiologist interpretation.  CT remarkable.  Chest x-ray with questionable humeral neck fracture, she does not have point tenderness here.  No sig pain with arm movement on left. Does  have recent fall.  Will get dedicated xr per radiology recommendation. Will place sling; recommend outpatient follow up.   Cardiac monitoring reviewed and interpreted personally which shows NSR  Sodium 125, potassium 3.3.  Creatinine stable.  Urinalysis with many bacteria but only 6-10 WBCs.  Send culture.  No sig dysuria, no leukocytosis. Urine electrolytes pending.  Osmolality is 270.  Urine osmolality pending.    Management: IV fluids  ED Course: Clinical Course as of 09/26/21 0003  Tue Sep 25, 2021  2247 She is on HCTZ, potential source of her HypoNa [SG]    Clinical Course User Index [SG] Wynona Dove A, DO     Reassessment:  Symptoms unchanged  Admission was considered.    Hyponatremia likely secondary to HCTZ use.  Defer further work-up to inpatient team.  Recommend admission for critical hyponatremia.  Patient does have intermittent changes to her mental status, no focal neurologic deficits, this could be secondary to her hyponatremia.  Patient also has chronic opiate and benzo use; polypharmacy likely contributing factor as well.  Continue IV fluids.  Recommend serial BMP.  Patient agreeable to admission.  Discussed with Dr. Hal Hope accepts patient for admission               Social determinants of health include -  Social History   Socioeconomic History   Marital status: Married    Spouse name: Not on file   Number of children: Not on file   Years of education: Not on file   Highest education level: Not on file  Occupational History   Not on file  Tobacco Use   Smoking status: Never   Smokeless tobacco: Never  Vaping Use   Vaping Use: Never used  Substance and Sexual Activity   Alcohol use: No   Drug use: No   Sexual activity: Not on file  Other Topics Concern   Not on file  Social History Narrative   Not on file   Social Determinants of Health   Financial Resource Strain: Not on file  Food Insecurity: Not on file  Transportation  Needs: Not on file  Physical Activity: Not on file  Stress: Not on file  Social Connections: Not on file  Intimate Partner Violence: Not on file      This chart was dictated using voice recognition software.  Despite best efforts to proofread,  errors can occur which can change the documentation meaning.         Final Clinical Impression(s) / ED Diagnoses Final diagnoses:  Hyponatremia  Recurrent falls  Syncope, unspecified syncope type    Rx / DC Orders ED Discharge Orders     None         Jeanell Sparrow, DO 09/26/21 0003

## 2021-09-26 ENCOUNTER — Encounter (HOSPITAL_BASED_OUTPATIENT_CLINIC_OR_DEPARTMENT_OTHER): Payer: Self-pay | Admitting: Internal Medicine

## 2021-09-26 ENCOUNTER — Emergency Department (HOSPITAL_BASED_OUTPATIENT_CLINIC_OR_DEPARTMENT_OTHER): Payer: Medicare Other | Admitting: Radiology

## 2021-09-26 DIAGNOSIS — Z91030 Bee allergy status: Secondary | ICD-10-CM | POA: Diagnosis not present

## 2021-09-26 DIAGNOSIS — G894 Chronic pain syndrome: Secondary | ICD-10-CM

## 2021-09-26 DIAGNOSIS — K219 Gastro-esophageal reflux disease without esophagitis: Secondary | ICD-10-CM | POA: Diagnosis not present

## 2021-09-26 DIAGNOSIS — S0003XA Contusion of scalp, initial encounter: Secondary | ICD-10-CM | POA: Diagnosis not present

## 2021-09-26 DIAGNOSIS — Z043 Encounter for examination and observation following other accident: Secondary | ICD-10-CM | POA: Diagnosis not present

## 2021-09-26 DIAGNOSIS — Z880 Allergy status to penicillin: Secondary | ICD-10-CM | POA: Diagnosis not present

## 2021-09-26 DIAGNOSIS — I10 Essential (primary) hypertension: Secondary | ICD-10-CM | POA: Diagnosis not present

## 2021-09-26 DIAGNOSIS — R269 Unspecified abnormalities of gait and mobility: Secondary | ICD-10-CM | POA: Diagnosis not present

## 2021-09-26 DIAGNOSIS — Z79899 Other long term (current) drug therapy: Secondary | ICD-10-CM | POA: Diagnosis not present

## 2021-09-26 DIAGNOSIS — R296 Repeated falls: Secondary | ICD-10-CM | POA: Diagnosis present

## 2021-09-26 DIAGNOSIS — E876 Hypokalemia: Secondary | ICD-10-CM

## 2021-09-26 DIAGNOSIS — E871 Hypo-osmolality and hyponatremia: Secondary | ICD-10-CM

## 2021-09-26 DIAGNOSIS — Z79891 Long term (current) use of opiate analgesic: Secondary | ICD-10-CM

## 2021-09-26 DIAGNOSIS — Z1611 Resistance to penicillins: Secondary | ICD-10-CM | POA: Diagnosis present

## 2021-09-26 DIAGNOSIS — Z886 Allergy status to analgesic agent status: Secondary | ICD-10-CM | POA: Diagnosis not present

## 2021-09-26 DIAGNOSIS — G3184 Mild cognitive impairment, so stated: Secondary | ICD-10-CM | POA: Diagnosis present

## 2021-09-26 DIAGNOSIS — Z888 Allergy status to other drugs, medicaments and biological substances status: Secondary | ICD-10-CM | POA: Diagnosis not present

## 2021-09-26 DIAGNOSIS — Z91018 Allergy to other foods: Secondary | ICD-10-CM | POA: Diagnosis not present

## 2021-09-26 DIAGNOSIS — B9620 Unspecified Escherichia coli [E. coli] as the cause of diseases classified elsewhere: Secondary | ICD-10-CM | POA: Diagnosis present

## 2021-09-26 DIAGNOSIS — N390 Urinary tract infection, site not specified: Secondary | ICD-10-CM | POA: Diagnosis present

## 2021-09-26 DIAGNOSIS — S0512XA Contusion of eyeball and orbital tissues, left eye, initial encounter: Secondary | ICD-10-CM | POA: Diagnosis not present

## 2021-09-26 LAB — BASIC METABOLIC PANEL
Anion gap: 9 (ref 5–15)
Anion gap: 11 (ref 5–15)
BUN: 10 mg/dL (ref 8–23)
BUN: 6 mg/dL — ABNORMAL LOW (ref 8–23)
CO2: 29 mmol/L (ref 22–32)
CO2: 29 mmol/L (ref 22–32)
Calcium: 9.1 mg/dL (ref 8.9–10.3)
Calcium: 9 mg/dL (ref 8.9–10.3)
Chloride: 87 mmol/L — ABNORMAL LOW (ref 98–111)
Chloride: 87 mmol/L — ABNORMAL LOW (ref 98–111)
Creatinine, Ser: 0.9 mg/dL (ref 0.44–1.00)
Creatinine, Ser: 0.57 mg/dL (ref 0.44–1.00)
GFR, Estimated: >60 mL/min (ref >60)
GFR, Estimated: >60 mL/min (ref >60)
Glucose, Bld: 126 mg/dL — ABNORMAL HIGH (ref 70–99)
Glucose, Bld: 123 mg/dL — ABNORMAL HIGH (ref 70–99)
Potassium: 2.7 mmol/L — CL (ref 3.5–5.1)
Potassium: 3 mmol/L — ABNORMAL LOW (ref 3.5–5.1)
Sodium: 125 mmol/L — ABNORMAL LOW (ref 135–145)
Sodium: 127 mmol/L — ABNORMAL LOW (ref 135–145)

## 2021-09-26 LAB — LIPID PANEL
Cholesterol: 103 mg/dL (ref 0–200)
HDL: 38 mg/dL — ABNORMAL LOW (ref >40)
LDL Cholesterol: 59 mg/dL (ref 0–99)
Total CHOL/HDL Ratio: 2.7 RATIO
Triglycerides: 31 mg/dL (ref <150)
VLDL: 6 mg/dL (ref 0–40)

## 2021-09-26 LAB — TROPONIN I (HIGH SENSITIVITY): Troponin I (High Sensitivity): 5 ng/L (ref <18)

## 2021-09-26 MED ORDER — ONDANSETRON HCL 4 MG/2ML IJ SOLN: 4.0000 mg | Freq: Four times a day (QID) | INTRAMUSCULAR | Status: DC | PRN

## 2021-09-26 MED ORDER — HEPARIN SODIUM (PORCINE) 5000 UNIT/ML IJ SOLN
5000.0000 [IU] | Freq: Three times a day (TID) | INTRAMUSCULAR | Status: DC
Start: 2021-09-26 — End: 2021-09-28
  Administered 2021-09-26 – 2021-09-28 (×7): 5000 [IU] via SUBCUTANEOUS
  Filled 2021-09-26 (×6): qty 1

## 2021-09-26 MED ORDER — POTASSIUM CHLORIDE CRYS ER 20 MEQ PO TBCR
40.0000 meq | EXTENDED_RELEASE_TABLET | ORAL | Status: AC
  Administered 2021-09-26 (×2): 40 meq via ORAL
  Filled 2021-09-26 (×2): qty 2

## 2021-09-26 MED ORDER — ACETAMINOPHEN 650 MG RE SUPP: 650.0000 mg | Freq: Four times a day (QID) | RECTAL | Status: DC | PRN

## 2021-09-26 MED ORDER — MORPHINE SULFATE (PF) 2 MG/ML IV SOLN
2.0000 mg | Freq: Once | INTRAVENOUS | Status: AC
  Administered 2021-09-26: 2 mg via INTRAVENOUS
  Filled 2021-09-26: qty 1

## 2021-09-26 MED ORDER — ATENOLOL 50 MG PO TABS
100.0000 mg | ORAL_TABLET | Freq: Every day | ORAL | Status: DC
  Administered 2021-09-27: 100 mg via ORAL
  Filled 2021-09-26: qty 2

## 2021-09-26 MED ORDER — OXYCODONE-ACETAMINOPHEN 10-325 MG PO TABS: 1.0000 | ORAL_TABLET | Freq: Four times a day (QID) | ORAL | Status: DC | PRN

## 2021-09-26 MED ORDER — OXYCODONE HCL 5 MG PO TABS
5.0000 mg | ORAL_TABLET | Freq: Four times a day (QID) | ORAL | Status: DC | PRN
  Administered 2021-09-27: 5 mg via ORAL
  Filled 2021-09-26: qty 1

## 2021-09-26 MED ORDER — ONDANSETRON HCL 4 MG PO TABS: 4.0000 mg | ORAL_TABLET | Freq: Four times a day (QID) | ORAL | Status: DC | PRN

## 2021-09-26 MED ORDER — POLYETHYLENE GLYCOL 3350 17 G PO PACK
17.0000 g | PACK | Freq: Every day | ORAL | Status: DC
  Administered 2021-09-27 – 2021-09-28 (×2): 17 g via ORAL
  Filled 2021-09-26 (×3): qty 1

## 2021-09-26 MED ORDER — LISINOPRIL 20 MG PO TABS
20.0000 mg | ORAL_TABLET | Freq: Every day | ORAL | Status: DC
  Administered 2021-09-26 – 2021-09-28 (×3): 20 mg via ORAL
  Filled 2021-09-26 (×3): qty 1

## 2021-09-26 MED ORDER — ACETAMINOPHEN 325 MG PO TABS: 650.0000 mg | ORAL_TABLET | Freq: Four times a day (QID) | ORAL | Status: DC | PRN

## 2021-09-26 MED ORDER — PANTOPRAZOLE SODIUM 40 MG PO TBEC
40.0000 mg | DELAYED_RELEASE_TABLET | Freq: Every day | ORAL | Status: DC
  Administered 2021-09-26 – 2021-09-28 (×3): 40 mg via ORAL
  Filled 2021-09-26 (×3): qty 1

## 2021-09-26 MED ORDER — POTASSIUM CHLORIDE CRYS ER 20 MEQ PO TBCR
40.0000 meq | EXTENDED_RELEASE_TABLET | Freq: Once | ORAL | Status: AC
  Administered 2021-09-26: 40 meq via ORAL
  Filled 2021-09-26: qty 2

## 2021-09-26 MED ORDER — ALPRAZOLAM 0.25 MG PO TABS
0.2500 mg | ORAL_TABLET | Freq: Every day | ORAL | Status: DC | PRN
  Administered 2021-09-26 – 2021-09-27 (×2): 0.25 mg via ORAL
  Filled 2021-09-26 (×2): qty 1

## 2021-09-26 MED ORDER — OXYCODONE-ACETAMINOPHEN 5-325 MG PO TABS
1.0000 | ORAL_TABLET | Freq: Four times a day (QID) | ORAL | Status: DC | PRN
  Administered 2021-09-26 – 2021-09-27 (×2): 1 via ORAL
  Filled 2021-09-26 (×2): qty 1

## 2021-09-26 NOTE — ED Notes (Signed)
Care Handoff/Patient Report given to [facility] RN Grayce Sessions at this Time. All Questions Answered.

## 2021-09-26 NOTE — ED Notes (Signed)
[facility] at the Bedside. 

## 2021-09-26 NOTE — Assessment & Plan Note (Signed)
We will need to find out how much opiate she is using.

## 2021-09-26 NOTE — Progress Notes (Signed)
  Progress Note   Patient: Brooke Weber ALP:[id number] DOB: 03/14/1940 DOA: 09/25/2021     0 DOS: the patient was seen and examined on 09/26/2021   Brief hospital course: 81 year old female history of hypertension, chronic pain syndrome, reflux presents to the ER today with reported hyponatremia from the office.  She was seen last week after a fall.  She was noted to have hyponatremia but she did not want a wait in the ER for evaluation.  She has been having intermittent falling and intermittent confusion reportedly.  Family is not at the bedside.  She was seen in December 2022 in the ER was also noted to have hyponatremia with a serum sodium of 125.  Assessment and Plan: * Chronic hyponatremia - Patient's hyponatremia appears to be chronic as far back as December 2022.  Her serum sodium based on the last 3 lab entry is have been 125.   - Appears euvolemic on exam. Urine Na 51 -Cont on water restrictions.   -Sodium trending up. -Repeat Na in AM -Recommend avoiding HCTZ from this point on  Hypokalemia -Remains low this AM, will correct -Repeat bmet in AM   Mild cognitive impairment Stable.   Long-term current use of opiate analgesic -Cont home regimen as tolerated   Gastroesophageal reflux disease -Continue Prilosec 20 mg daily.   Essential hypertension -Hold HCTZ per above -cont needed clonidine for now. -Resumed home lisinopril and atenolol   Chronic pain syndrome -Cont home pain regimen       Subjective: Without complaints. Eager to go home soon  Physical Exam: Vitals:   09/26/21 0210 09/26/21 0245 09/26/21 0350 09/26/21 0958  BP: (!) 149/71 (!) 170/80 (!) 167/65 (!) 153/68  Pulse: 61 87 60 61  Resp: '18 20 20 17  '$ Temp:   98.4 F (36.9 C) (!) 97.5 F (36.4 C)  TempSrc:   Oral Oral  SpO2: 100% 96% 97% 98%  Weight:   67.4 kg   Height:   '5\' 5"'$  (1.651 m)    General exam: Awake, laying in bed, in nad Respiratory system: Normal respiratory effort, no  wheezing Cardiovascular system: regular rate, s1, s2 Gastrointestinal system: Soft, nondistended, positive BS Central nervous system: CN2-12 grossly intact, strength intact Extremities: Perfused, no clubbing Skin: Normal skin turgor, no notable skin lesions seen Psychiatry: Mood normal // no visual hallucinations   Data Reviewed:  Labs reviewed: Na 127, K 3.0, Cr 0.57  Family Communication: Pt in room, family currently not at bedside  Disposition: Status is: Observation The patient will require care spanning > 2 midnights and should be moved to inpatient because: Severity of illness  Planned Discharge Destination: Home    Author: Marylu Lund, MD 09/26/2021 4:14 PM  For on call review [email or web address].

## 2021-09-26 NOTE — Assessment & Plan Note (Signed)
Hold HCTZ.  We will need completed MAR.  As needed clonidine for now.

## 2021-09-26 NOTE — Progress Notes (Signed)
  Mobility Specialist Criteria Algorithm Info.    09/26/21 1352  Mobility  Activity Refused mobility   Patient declined for unspecified reasons. Will f/u as time permits.  09/26/2021 2:14 PM  Brooke Weber, [place], [place]  DGNPH:[phone] Office: [phone]

## 2021-09-26 NOTE — Assessment & Plan Note (Signed)
Replete with oral KCl 40 meq q4h x 2 doses

## 2021-09-26 NOTE — H&P (Addendum)
History and Physical    TIEN AISPURO ZOX:[id number] DOB: [birth year] DOA: 09/25/2021  DOS: the patient was seen and examined on 09/25/2021  PCP: Lawerance Cruel, MD   Patient coming from: Home  I have personally briefly reviewed patient's old medical records in [place]  Chief complaint: Hyponatremia History of present illness: 81 year old female history of hypertension, chronic pain syndrome, reflux presents to the ER today with reported hyponatremia from the office.  She was seen last week after a fall.  She was noted to have hyponatremia but she did not want a wait in the ER for evaluation.  She has been having intermittent falling and intermittent confusion reportedly.  Family is not at the bedside.  She was seen in December 2022 in the ER was also noted to have hyponatremia with a serum sodium of 125.  She is a patient with [facility] physicians.  Only partial records are available.  She has not had any lab work in their office since September 14, 2020.  In the ER, serum sodium was 125.  TSH was normal at 0.56  Urine sodium of 51 Urine osmolality of 402 Serum osmolality of 270  Patient noted to have bruising over the left orbit, left shoulder, left pretibial area, left elbow.  These are in varying stages of healing.  [facility] contacted for transfer to [facility] for further care.     ED Course: Serum sodium 125, urine osmolality 402, serum osmolality 270, urine sodium of 51  Review of Systems:  Review of Systems  Constitutional: Negative.   HENT: Negative.    Eyes: Negative.   Respiratory: Negative.    Cardiovascular: Negative.   Gastrointestinal: Negative.   Genitourinary: Negative.   Musculoskeletal:  Positive for falls.  Skin: Negative.   Neurological:  Positive for dizziness.  Endo/Heme/Allergies: Negative.   Psychiatric/Behavioral: Negative.    All other systems reviewed and are negative.   Past Medical History:  Diagnosis Date   Arthritis     Fall    Hypertension     Past Surgical History:  Procedure Laterality Date   ABDOMINAL HYSTERECTOMY  1978   Partial   BACK SURGERY  2003   cervial fusion four discs   CHOLECYSTECTOMY  2008   JOINT REPLACEMENT Right 1996   OPEN REDUCTION INTERNAL FIXATION (ORIF) PROXIMAL PHALANX Right 02/18/2017   Procedure: CLOSED REDUCTION PINNING RIGHT INDEX PROXIMAL PHALANX FRACTURE;  Surgeon: Roseanne Kaufman, MD;  Location: [place];  Service: Orthopedics;  Laterality: Right;  [place] SURGERY  2008   [place]     reports that she has never smoked. She has never used smokeless tobacco. She reports that she does not drink alcohol and does not use drugs.  Allergies  Allergen Reactions   Aspirin Anaphylaxis   Bee Venom Anaphylaxis   Iodine Anaphylaxis   Other Anaphylaxis    nuts   Penicillins Anaphylaxis    Has patient had a PCN reaction causing immediate rash, facial/tongue/throat swelling, SOB or lightheadedness with hypotension: No Has patient had a PCN reaction causing severe rash involving mucus membranes or skin necrosis: No Has patient had a PCN reaction that required hospitalization: No Has patient had a PCN reaction occurring within the last 10 years: No If all of the above answers are "NO", then may proceed with Cephalosporin use.    Family History  Problem Relation Age of Onset   Breast cancer Sister 38   Breast cancer Maternal Aunt 48  Prior to Admission medications   Medication Sig Start Date End Date Taking? Authorizing Provider  ALPRAZolam Duanne Moron) 0.5 MG tablet Take 0.25 mg by mouth daily as needed for anxiety.     [provider]  atenolol (TENORMIN) 100 MG tablet Take 100 mg by mouth at bedtime. 01/15/14   [provider]  augmented betamethasone dipropionate (DIPROLENE-AF) 0.05 % cream Apply 1 application topically daily as needed (eczema).  01/01/17   [provider]  lisinopril-hydrochlorothiazide (PRINZIDE,ZESTORETIC) 20-12.5 MG  per tablet Take 1 tablet by mouth daily. 01/15/14   [provider]  omeprazole (PRILOSEC) 20 MG capsule Take 20 mg by mouth daily.    [provider]  oxyCODONE-acetaminophen (PERCOCET) 10-325 MG per tablet Take 1 tablet by mouth every 6 (six) hours as needed for pain.  02/18/14   [provider]  Polyethyl Glycol-Propyl Glycol (SYSTANE) 0.4-0.3 % SOLN Place 2 drops into both eyes 3 (three) times daily.    [provider]  polyethylene glycol (MIRALAX / GLYCOLAX) packet Take 17 g by mouth daily.    [provider]  PROCTOSOL HC 2.5 % rectal cream Place 1 application rectally daily as needed for anal itching.  12/10/16   [provider]    Physical Exam: Vitals:   09/26/21 0144 09/26/21 0210 09/26/21 0245 09/26/21 0350  BP:  (!) 149/71 (!) 170/80 (!) 167/65  Pulse:  61 87 60  Resp:  '18 20 20  '$ Temp:    98.4 F (36.9 C)  TempSrc:    Oral  SpO2:  100% 96% 97%  Weight: 80 kg   67.4 kg  Height: '5\' 5"'$  (1.651 m)   '5\' 5"'$  (1.651 m)    Physical Exam Vitals and nursing note reviewed.  Constitutional:      General: She is not in acute distress.    Appearance: Normal appearance. She is not ill-appearing, toxic-appearing or diaphoretic.  HENT:     Head: Normocephalic.     Nose: Nose normal.  Eyes:     General: No scleral icterus.    Comments: Bruising over the left periorbital region.  Various stages of healing to the bruise.  Cardiovascular:     Rate and Rhythm: Normal rate and regular rhythm.     Pulses: Normal pulses.  Pulmonary:     Effort: Pulmonary effort is normal. No respiratory distress.     Breath sounds: Normal breath sounds. No wheezing.  Abdominal:     General: Abdomen is flat. Bowel sounds are normal. There is no distension.     Palpations: Abdomen is soft.     Tenderness: There is no abdominal tenderness. There is no guarding.  Musculoskeletal:     Left lower leg: No edema.  Skin:    Capillary Refill: Capillary refill  takes less than 2 seconds.     Findings: Bruising present.     Comments: Bruising noted over the left shoulder, left tricep area, left elbow, left lower leg.  Neurological:     General: No focal deficit present.     Mental Status: She is alert and oriented to person, place, and time.      Labs on Admission: I have personally reviewed following labs and imaging studies  CBC: Recent Labs  Lab 09/25/21 1837  WBC 9.5  HGB 12.7  HCT 35.9*  MCV 88.6  PLT 099   Basic Metabolic Panel: Recent Labs  Lab 09/25/21 1837 09/26/21 0100  NA 125* 125*  K 3.3* 2.7*  CL 86* 87*  CO2 27 29  GLUCOSE 129* 126*  BUN 11 10  CREATININE 0.72 0.90  CALCIUM 9.3 9.1   GFR: Estimated Creatinine Clearance: 44.1 mL/min (by C-G formula based on SCr of 0.9 mg/dL). Liver Function Tests: No results for input(s): "AST", "ALT", "ALKPHOS", "BILITOT", "PROT", "ALBUMIN" in the last 168 hours. No results for input(s): "LIPASE", "AMYLASE" in the last 168 hours. No results for input(s): "AMMONIA" in the last 168 hours. Coagulation Profile: No results for input(s): "INR", "PROTIME" in the last 168 hours. Cardiac Enzymes: Recent Labs  Lab 09/25/21 1837 09/26/21 0100  TROPONINIHS 3 5   BNP (last 3 results) No results for input(s): "PROBNP" in the last 8760 hours. HbA1C: No results for input(s): "HGBA1C" in the last 72 hours. CBG: No results for input(s): "GLUCAP" in the last 168 hours. Lipid Profile: No results for input(s): "CHOL", "HDL", "LDLCALC", "TRIG", "CHOLHDL", "LDLDIRECT" in the last 72 hours. Thyroid Function Tests: Recent Labs    09/25/21 2100  TSH 0.565   Anemia Panel: No results for input(s): "VITAMINB12", "FOLATE", "FERRITIN", "TIBC", "IRON", "RETICCTPCT" in the last 72 hours. Urine analysis:    Component Value Date/Time   COLORURINE YELLOW 09/25/2021 2023   APPEARANCEUR HAZY (A) 09/25/2021 2023   LABSPEC 1.014 09/25/2021 2023   PHURINE 6.0 09/25/2021 2023   GLUCOSEU NEGATIVE  09/25/2021 2023   HGBUR NEGATIVE 09/25/2021 2023   BILIRUBINUR NEGATIVE 09/25/2021 2023   KETONESUR NEGATIVE 09/25/2021 2023   PROTEINUR NEGATIVE 09/25/2021 2023   NITRITE NEGATIVE 09/25/2021 2023   LEUKOCYTESUR LARGE (A) 09/25/2021 2023    Radiological Exams on Admission: I have personally reviewed images DG Shoulder Left  Result Date: 09/26/2021 CLINICAL DATA:  Fall.  Abnormal chest x-ray. EXAM: LEFT SHOULDER - 2+ VIEW COMPARISON:  Chest radiograph 09/25/2021. Left humerus 07/31/2003 and fluoroscopy 08/04/2003 FINDINGS: Deformity and sclerosis in the proximal left humerus. No definite acute cortical changes are demonstrated. This likely represents old healed deformity relating to previous left humeral fracture and subsequent internal fixation from 2005. Degenerative changes in the glenohumeral and acromioclavicular joints. IMPRESSION: Deformity and sclerosis in the left proximal humerus likely representing old fracture deformity. No definite acute fractures demonstrated. Electronically Signed   By: Lucienne Capers M.D.   On: 09/26/2021 00:39   CT Head Wo Contrast  Result Date: 09/25/2021 CLINICAL DATA:  Minor head trauma. Multiple recent falls. Bruising around the left eye and forehead. EXAM: CT HEAD WITHOUT CONTRAST CT MAXILLOFACIAL WITHOUT CONTRAST CT CERVICAL SPINE WITHOUT CONTRAST TECHNIQUE: Multidetector CT imaging of the head, cervical spine, and maxillofacial structures were performed using the standard protocol without intravenous contrast. Multiplanar CT image reconstructions of the cervical spine and maxillofacial structures were also generated. RADIATION DOSE REDUCTION: This exam was performed according to the departmental dose-optimization program which includes automated exposure control, adjustment of the mA and/or kV according to patient size and/or use of iterative reconstruction technique. COMPARISON:  CT head and cervical spine 02/08/2021 FINDINGS: CT HEAD FINDINGS Brain: Diffuse  cerebral atrophy. Ventricular dilatation consistent with central atrophy. Low-attenuation changes in the deep white matter consistent with small vessel ischemia. No abnormal extra-axial fluid collections. No mass effect or midline shift. Gray-white matter junctions are distinct. Basal cisterns are not effaced. No acute intracranial hemorrhage. Vascular: No hyperdense vessel or unexpected calcification. Skull: Calvarium appears intact. Other: None. CT MAXILLOFACIAL FINDINGS Osseous: No fracture or mandibular dislocation. No destructive process. Poor dentition. Orbits: Negative. No traumatic or inflammatory finding. Sinuses: Clear. Soft tissues: Mild subcutaneous scalp hematoma over the left supraorbital  region. Metallic structure demonstrated in the floor of the oral cavity towards the left probably representing a piercing. CT CERVICAL SPINE FINDINGS Alignment: Normal alignment. Skull base and vertebrae: Skull base appears intact. No vertebral compression. No focal bone lesion or bone destruction. Soft tissues and spinal canal: No prevertebral soft tissue swelling. No abnormal paraspinal soft tissue mass or infiltration. Disc levels: Postoperative changes with anterior plate and screw fixation and intervertebral fusion from C4 through C7. Fused segments appear intact. Surgical hardware appears intact without change since prior study. Upper chest: Lung apices are clear. Other: None. IMPRESSION: 1. No acute intracranial abnormalities. Chronic atrophy and small vessel ischemic changes. 2. No acute displaced orbital or facial fractures identified. 3. Postoperative anterior fixation with intervertebral fusion from C4 through C7. Normal alignment of the cervical spine. No acute displaced fractures identified. Electronically Signed   By: Lucienne Capers M.D.   On: 09/25/2021 21:44   CT Cervical Spine Wo Contrast  Result Date: 09/25/2021 CLINICAL DATA:  Minor head trauma. Multiple recent falls. Bruising around the left  eye and forehead. EXAM: CT HEAD WITHOUT CONTRAST CT MAXILLOFACIAL WITHOUT CONTRAST CT CERVICAL SPINE WITHOUT CONTRAST TECHNIQUE: Multidetector CT imaging of the head, cervical spine, and maxillofacial structures were performed using the standard protocol without intravenous contrast. Multiplanar CT image reconstructions of the cervical spine and maxillofacial structures were also generated. RADIATION DOSE REDUCTION: This exam was performed according to the departmental dose-optimization program which includes automated exposure control, adjustment of the mA and/or kV according to patient size and/or use of iterative reconstruction technique. COMPARISON:  CT head and cervical spine 02/08/2021 FINDINGS: CT HEAD FINDINGS Brain: Diffuse cerebral atrophy. Ventricular dilatation consistent with central atrophy. Low-attenuation changes in the deep white matter consistent with small vessel ischemia. No abnormal extra-axial fluid collections. No mass effect or midline shift. Gray-white matter junctions are distinct. Basal cisterns are not effaced. No acute intracranial hemorrhage. Vascular: No hyperdense vessel or unexpected calcification. Skull: Calvarium appears intact. Other: None. CT MAXILLOFACIAL FINDINGS Osseous: No fracture or mandibular dislocation. No destructive process. Poor dentition. Orbits: Negative. No traumatic or inflammatory finding. Sinuses: Clear. Soft tissues: Mild subcutaneous scalp hematoma over the left supraorbital region. Metallic structure demonstrated in the floor of the oral cavity towards the left probably representing a piercing. CT CERVICAL SPINE FINDINGS Alignment: Normal alignment. Skull base and vertebrae: Skull base appears intact. No vertebral compression. No focal bone lesion or bone destruction. Soft tissues and spinal canal: No prevertebral soft tissue swelling. No abnormal paraspinal soft tissue mass or infiltration. Disc levels: Postoperative changes with anterior plate and screw  fixation and intervertebral fusion from C4 through C7. Fused segments appear intact. Surgical hardware appears intact without change since prior study. Upper chest: Lung apices are clear. Other: None. IMPRESSION: 1. No acute intracranial abnormalities. Chronic atrophy and small vessel ischemic changes. 2. No acute displaced orbital or facial fractures identified. 3. Postoperative anterior fixation with intervertebral fusion from C4 through C7. Normal alignment of the cervical spine. No acute displaced fractures identified. Electronically Signed   By: Lucienne Capers M.D.   On: 09/25/2021 21:44   CT Maxillofacial Wo Contrast  Result Date: 09/25/2021 CLINICAL DATA:  Minor head trauma. Multiple recent falls. Bruising around the left eye and forehead. EXAM: CT HEAD WITHOUT CONTRAST CT MAXILLOFACIAL WITHOUT CONTRAST CT CERVICAL SPINE WITHOUT CONTRAST TECHNIQUE: Multidetector CT imaging of the head, cervical spine, and maxillofacial structures were performed using the standard protocol without intravenous contrast. Multiplanar CT image reconstructions of the cervical spine  and maxillofacial structures were also generated. RADIATION DOSE REDUCTION: This exam was performed according to the departmental dose-optimization program which includes automated exposure control, adjustment of the mA and/or kV according to patient size and/or use of iterative reconstruction technique. COMPARISON:  CT head and cervical spine 02/08/2021 FINDINGS: CT HEAD FINDINGS Brain: Diffuse cerebral atrophy. Ventricular dilatation consistent with central atrophy. Low-attenuation changes in the deep white matter consistent with small vessel ischemia. No abnormal extra-axial fluid collections. No mass effect or midline shift. Gray-white matter junctions are distinct. Basal cisterns are not effaced. No acute intracranial hemorrhage. Vascular: No hyperdense vessel or unexpected calcification. Skull: Calvarium appears intact. Other: None. CT  MAXILLOFACIAL FINDINGS Osseous: No fracture or mandibular dislocation. No destructive process. Poor dentition. Orbits: Negative. No traumatic or inflammatory finding. Sinuses: Clear. Soft tissues: Mild subcutaneous scalp hematoma over the left supraorbital region. Metallic structure demonstrated in the floor of the oral cavity towards the left probably representing a piercing. CT CERVICAL SPINE FINDINGS Alignment: Normal alignment. Skull base and vertebrae: Skull base appears intact. No vertebral compression. No focal bone lesion or bone destruction. Soft tissues and spinal canal: No prevertebral soft tissue swelling. No abnormal paraspinal soft tissue mass or infiltration. Disc levels: Postoperative changes with anterior plate and screw fixation and intervertebral fusion from C4 through C7. Fused segments appear intact. Surgical hardware appears intact without change since prior study. Upper chest: Lung apices are clear. Other: None. IMPRESSION: 1. No acute intracranial abnormalities. Chronic atrophy and small vessel ischemic changes. 2. No acute displaced orbital or facial fractures identified. 3. Postoperative anterior fixation with intervertebral fusion from C4 through C7. Normal alignment of the cervical spine. No acute displaced fractures identified. Electronically Signed   By: Lucienne Capers M.D.   On: 09/25/2021 21:44   DG Chest Portable 1 View  Result Date: 09/25/2021 CLINICAL DATA:  LOC EXAM: PORTABLE CHEST 1 VIEW COMPARISON:  03/03/2014 FINDINGS: Hardware in the cervicothoracic spine. No acute airspace disease or effusion. Stable cardiomediastinal silhouette with aortic atherosclerosis. No pneumothorax. Possible age indeterminate left humeral neck fracture, question deformity at the left humerus on 2015 comparison IMPRESSION: 1. Clear lung fields. 2. Possible age indeterminate left humeral neck fracture, correlate for point tenderness with follow-up dedicated shoulder radiographs if indicated.  Electronically Signed   By: Donavan Foil M.D.   On: 09/25/2021 21:42    EKG: My personal interpretation of EKG shows: NSR, IVCD    Assessment/Plan Principal Problem:   Chronic hyponatremia Active Problems:   Chronic pain syndrome   Essential hypertension   Gastroesophageal reflux disease   Long-term current use of opiate analgesic   Mild cognitive impairment   Hypokalemia    Assessment and Plan: * Chronic hyponatremia Observation telemetry bed.  Patient's hyponatremia appears to be chronic as far back as December 2022.  Her serum sodium based on the last 3 lab entry is have been 125.  She has not had any lab work from her PCP office in the last several months.  We will place her on water restrictions.  Regular diet.  Check serum sodiums every 6 hours.  Hold HCTZ.  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  Cannot exclude HCTZ as a cause as well.  We will start with water restrictions.  May need to add salt tablets if this does not improve her serum sodium.  Hypokalemia Replete with oral KCl 40 meq q4h x 2 doses  Mild cognitive impairment Stable.  Long-term  current use of opiate analgesic We will need to find out how much opiate she is using.  Gastroesophageal reflux disease Continue Prilosec 20 mg daily.  Essential hypertension Hold HCTZ.  We will need completed MAR.  As needed clonidine for now.  Chronic pain syndrome Chronic.  Unclear how much opiate pain medicine she is taking at this point.  PDMP is down and cannot access her pharmacy records.   DVT prophylaxis: SQ Heparin Code Status: Full Code Family Communication: no family at bedside  Disposition Plan: return home  Consults called: none  Admission status: Observation, Telemetry bed   Kristopher Oppenheim, DO [facility] 09/26/2021, 4:48 AM

## 2021-09-26 NOTE — Evaluation (Signed)
Physical Therapy Evaluation Patient Details Name: Brooke Weber MRN: [id number] DOB: [birth year] Today's Date: 09/26/2021  History of Present Illness  81 year old female presents to the ER 09/25/21 from PCP with reported hyponatremia. Seen in ED last week after a fall, but did not want to wait. Pt has and increased confusion since last week. PMH: hypertension, chronic pain syndrome, reflux.  Clinical Impression  PTA pt living with husband in single story home with 3 steps to enter. Pt pleasantly confused about time and situation, reports that she uses a cane and the furniture to get around the house and that she is independent with her bathing and dressing, pt's husband assists with iADLs. Pt is currently limited in safe mobility by decreased cognition in particular decreased safety awareness and knowledge of DME use, in presence of generalized weakness and unsteadiness. PT recommending HHPT at discharge. PT will continue to follow acutely.       Recommendations for follow up therapy are one component of a multi-disciplinary discharge planning process, led by the attending physician.  Recommendations may be updated based on patient status, additional functional criteria and insurance authorization.  Follow Up Recommendations Home health PT      Assistance Recommended at Discharge Frequent or constant Supervision/Assistance  Patient can return home with the following  A little help with walking and/or transfers;A little help with bathing/dressing/bathroom;Assistance with cooking/housework;Direct supervision/assist for medications management;Direct supervision/assist for financial management;Assist for transportation;Help with stairs or ramp for entrance    Equipment Recommendations None recommended by PT     Functional Status Assessment Patient has had a recent decline in their functional status and demonstrates the ability to make significant improvements in function in a reasonable and  predictable amount of time.     Precautions / Restrictions Precautions Precautions: Fall Restrictions Weight Bearing Restrictions: No      Mobility  Bed Mobility Overal bed mobility: Modified Independent             General bed mobility comments: HoB elevated and use of bedrail    Transfers Overall transfer level: Needs assistance Equipment used: Rolling walker (2 wheels) Transfers: Sit to/from Stand Sit to Stand: Supervision           General transfer comment: supervision for power up and self steady from bed to RW and from lower toilet    Ambulation/Gait Ambulation/Gait assistance: Min assist Gait Distance (Feet): 12 Feet (x2) Assistive device: Rolling walker (2 wheels) Gait Pattern/deviations: Step-through pattern, Trunk flexed Gait velocity: variable Gait velocity interpretation: <1.8 ft/sec, indicate of risk for recurrent falls   General Gait Details: minA for managment of RW, pt with decrease safety awareness trying to push through obstacles or lift RW up and over obstacles        Balance Overall balance assessment: Mild deficits observed, not formally tested                                           Pertinent Vitals/Pain Pain Assessment Pain Assessment: Faces Faces Pain Scale: Hurts little more Pain Location: L arm mobilization Pain Descriptors / Indicators: Grimacing, Guarding Pain Intervention(s): Limited activity within patient's tolerance, Monitored during session, Repositioned    Home Living Family/patient expects to be discharged to:: Private residence Living Arrangements: Spouse/significant other Available Help at Discharge: Family;Available 24 hours/day Type of Home: House Home Access: Stairs to enter Entrance Stairs-Rails: Right;Left Entrance Stairs-Number of Steps:  3   Home Layout: One level Home Equipment: Shower seat;Grab bars - tub/shower;Rollator (4 wheels);Cane - single point      Prior Function Prior  Level of Function : Needs assist             Mobility Comments: uses cane and furniture to get around home, cane in public ADLs Comments: independent with bathing and dressing, husband with some assist with iADLs        Extremity/Trunk Assessment   Upper Extremity Assessment Upper Extremity Assessment: Generalized weakness;LUE deficits/detail LUE Deficits / Details: decreased ROM due to pain    Lower Extremity Assessment Lower Extremity Assessment: Generalized weakness    Cervical / Trunk Assessment Cervical / Trunk Assessment: Kyphotic  Communication   Communication: No difficulties  Cognition Arousal/Alertness: Awake/alert Behavior During Therapy: WFL for tasks assessed/performed Overall Cognitive Status: Impaired/Different from baseline Area of Impairment: Orientation, Memory, Following commands, Safety/judgement, Awareness, Problem solving                 Orientation Level: Disoriented to, Place, Time, Situation   Memory: Decreased short-term memory Following Commands: Follows one step commands consistently, Follows multi-step commands with increased time Safety/Judgement: Decreased awareness of safety, Decreased awareness of deficits Awareness: Anticipatory Problem Solving: Requires verbal cues, Requires tactile cues, Difficulty sequencing          General Comments General comments (skin integrity, edema, etc.): VSS on RA, pt with bruising under L eye        Assessment/Plan    PT Assessment Patient needs continued PT services  PT Problem List Decreased cognition;Decreased knowledge of use of DME;Decreased safety awareness       PT Treatment Interventions DME instruction;Gait training;Stair training;Functional mobility training;Therapeutic activities;Therapeutic exercise;Balance training;Cognitive remediation;Patient/family education    PT Goals (Current goals can be found in the Care Plan section)  Acute Rehab PT Goals Patient Stated Goal: go  home today PT Goal Formulation: With patient Time For Goal Achievement: 10/10/21 Potential to Achieve Goals: Good    Frequency Min 3X/week        AM-PAC PT "6 Clicks" Mobility  Outcome Measure Help needed turning from your back to your side while in a flat bed without using bedrails?: None Help needed moving from lying on your back to sitting on the side of a flat bed without using bedrails?: None Help needed moving to and from a bed to a chair (including a wheelchair)?: A Little Help needed standing up from a chair using your arms (e.g., wheelchair or bedside chair)?: A Little Help needed to walk in hospital room?: A Little Help needed climbing 3-5 steps with a railing? : A Lot 6 Click Score: 19    End of Session Equipment Utilized During Treatment: Gait belt Activity Tolerance: Patient tolerated treatment well Patient left: in chair;with call bell/phone within reach;with chair alarm set Nurse Communication: Mobility status PT Visit Diagnosis: Other abnormalities of gait and mobility (R26.89);Muscle weakness (generalized) (M62.81);Repeated falls (R29.6);Difficulty in walking, not elsewhere classified (R26.2);Unsteadiness on feet (R26.81)    Time: 5681-2751 PT Time Calculation (min) (ACUTE ONLY): 23 min   Charges:   PT Evaluation $PT Eval Low Complexity: 1 Low PT Treatments $Therapeutic Activity: 8-22 mins        Kuba Shepherd B. Migdalia Dk PT, DPT Acute Rehabilitation Services Please use secure chat or  Call Office [phone]   [place] 09/26/2021, 10:27 AM

## 2021-09-26 NOTE — Assessment & Plan Note (Addendum)
Observation telemetry bed.  Patient's hyponatremia appears to be chronic as far back as December 2022.  Her serum sodium based on the last 3 lab entry is have been 125.  She has not had any lab work from her PCP office in the last several months.  We will place her on water restrictions.  Regular diet.  Check serum sodiums every 6 hours.  Hold HCTZ.  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  Cannot exclude HCTZ as a cause as well.  We will start with water restrictions.  May need to add salt tablets if this does not improve her serum sodium.

## 2021-09-26 NOTE — Assessment & Plan Note (Signed)
Stable

## 2021-09-26 NOTE — Assessment & Plan Note (Signed)
-  Continue Prilosec 20mg daily

## 2021-09-26 NOTE — TOC Initial Note (Signed)
Transition of Care [facility]) - Initial/Assessment Note    Patient Details  Name: Brooke Weber MRN: [id number] Date of Birth: Jan 06, 1941  [facility]) CM/SW Contact:    Tom-Johnson, Renea Ee, RN Phone Number: 09/26/2021, 2:08 PM  Clinical Narrative:                  CM spoke with patient and spouse at bedside about needs for post hospital transition. Admitted for Chronic Hyponatremia. Had multiple falls at home.  From home with husband. Has three supportive adult children. Has a cane, rollator and shower seat at home.  PCP is Lawerance Cruel, MD [facility] [address].  Home health PT/OT referral called in to [facility] per patient and spouse request. Delsa Sale voiced acceptance and info on AVS. Family to transport at discharge. CM will continue to follow with needs.    Expected Discharge Plan: [place] Barriers to Discharge: Continued Medical Work up   Patient Goals and CMS Choice Patient states their goals for this hospitalization and ongoing recovery are:: To return home CMS Medicare.gov Compare Post Acute Care list provided to:: Patient Choice offered to / list presented to : Patient, Spouse  Expected Discharge Plan and Services Expected Discharge Plan: [place]   Discharge Planning Services: CM Consult Post Acute Care Choice: [place] arrangements for the past 2 months: Single Family Home                 DME Arranged: N/A DME Agency: NA       HH Arranged: PT, OT HH Agency: [facility] Date [place]: 09/26/21 Time HH Agency Contacted: 1400 Representative spoke with at [place]: [place] Arrangements/Services Living arrangements for the past 2 months: [place] with:: Spouse Patient language and need for interpreter reviewed:: Yes Do you feel safe going back to the place where you live?: Yes      Need for Family Participation in Patient Care: Yes  (Comment) Care giver support system in place?: Yes (comment) Current home services: DME (Cane, rollator, shower seat) Criminal Activity/Legal Involvement Pertinent to Current Situation/Hospitalization: No - Comment as needed  Activities of Daily Living Home Assistive Devices/Equipment: Cane (specify quad or straight) ADL Screening (condition at time of admission) Patient's cognitive ability adequate to safely complete daily activities?: Yes Is the patient deaf or have difficulty hearing?: No Does the patient have difficulty seeing, even when wearing glasses/contacts?: No Does the patient have difficulty concentrating, remembering, or making decisions?: Yes Patient able to express need for assistance with ADLs?: Yes Does the patient have difficulty dressing or bathing?: No Independently performs ADLs?: Yes (appropriate for developmental age) Does the patient have difficulty walking or climbing stairs?: Yes Weakness of Legs: Both Weakness of Arms/Hands: None  Permission Sought/Granted Permission sought to share information with : Case Manager, Customer service manager, Family Supports Permission granted to share information with : Yes, Verbal Permission Granted              Emotional Assessment Appearance:: Appears stated age Attitude/Demeanor/Rapport: Engaged, Gracious Affect (typically observed): Accepting, Appropriate, Calm, Hopeful Orientation: : Oriented to Self, Oriented to Place, Oriented to Situation Alcohol / Substance Use: Not Applicable Psych Involvement: No (comment)  Admission diagnosis:  Hyponatremia [E87.1] Recurrent falls [R29.6] Syncope, unspecified syncope type [R55] Patient Active Problem List   Diagnosis Date Noted   Essential hypertension 09/26/2021   Gastroesophageal reflux disease 09/26/2021  Mild cognitive impairment 09/26/2021   Hypokalemia 09/26/2021   Chronic hyponatremia 09/25/2021   Chronic pain syndrome 03/20/2017   Long-term current  use of opiate analgesic 03/20/2017   PCP:  Lawerance Cruel, MD [id number] [place], [place] [address]Phone: [phone]: [phone]    Social Determinants of Health (SDOH) Interventions    Readmission Risk Interventions     No data to display

## 2021-09-26 NOTE — Assessment & Plan Note (Signed)
Chronic.  Unclear how much opiate pain medicine she is taking at this point.  PDMP is down and cannot access her pharmacy records.

## 2021-09-26 NOTE — Care Management Obs Status (Signed)
[place] NOTIFICATION   Patient Details  Name: Brooke Weber MRN: [id number] Date of Birth: Jul 25, 1940   Medicare Observation Status Notification Given:  Yes    Tom-Johnson, Renea Ee, RN 09/26/2021, 2:21 PM

## 2021-09-26 NOTE — ED Notes (Signed)
Care Handoff/Patient Report given to [facility]. All Questions Answered.

## 2021-09-26 NOTE — Subjective & Objective (Signed)
Chief complaint: Hyponatremia History of present illness: 80 year old female history of hypertension, chronic pain syndrome, reflux presents to the ER today with reported hyponatremia from the office.  She was seen last week after a fall.  She was noted to have hyponatremia but she did not want a wait in the ER for evaluation.  She has been having intermittent falling and intermittent confusion reportedly.  Family is not at the bedside.  She was seen in December 2022 in the ER was also noted to have hyponatremia with a serum sodium of 125.  She is a patient with [facility] physicians.  Only partial records are available.  She has not had any lab work in their office since September 14, 2020.  In the ER, serum sodium was 125.  TSH was normal at 0.56  Urine sodium of 51 Urine osmolality of 402 Serum osmolality of 270  Patient noted to have bruising over the left orbit, left shoulder, left pretibial area, left elbow.  These are in varying stages of healing.  [facility] hospitalist contacted for transfer to [facility] for further care.

## 2021-09-26 NOTE — Hospital Course (Signed)
81 year old female history of hypertension, chronic pain syndrome, reflux presents to the ER today with reported hyponatremia from the office.  She was seen last week after a fall.  She was noted to have hyponatremia but she did not want a wait in the ER for evaluation.  She has been having intermittent falling and intermittent confusion reportedly.  Family is not at the bedside.  She was seen in December 2022 in the ER was also noted to have hyponatremia with a serum sodium of 125.

## 2021-09-26 NOTE — ED Notes (Signed)
Date and time results received: 09/26/21 0140 (use smartphrase ".now" to insert current time)  Test: potassium  Critical Value: 2.7  Name of Provider Notified: Everlene Balls, MD  Orders Received? Or Actions Taken?:  n/a

## 2021-09-27 DIAGNOSIS — E871 Hypo-osmolality and hyponatremia: Secondary | ICD-10-CM | POA: Diagnosis not present

## 2021-09-27 DIAGNOSIS — E876 Hypokalemia: Secondary | ICD-10-CM | POA: Diagnosis not present

## 2021-09-27 DIAGNOSIS — I10 Essential (primary) hypertension: Secondary | ICD-10-CM | POA: Diagnosis not present

## 2021-09-27 LAB — COMPREHENSIVE METABOLIC PANEL
ALT: 14 U/L (ref 0–44)
AST: 20 U/L (ref 15–41)
Albumin: 3.3 g/dL — ABNORMAL LOW (ref 3.5–5.0)
Alkaline Phosphatase: 86 U/L (ref 38–126)
Anion gap: 10 (ref 5–15)
BUN: 5 mg/dL — ABNORMAL LOW (ref 8–23)
CO2: 27 mmol/L (ref 22–32)
Calcium: 8.9 mg/dL (ref 8.9–10.3)
Chloride: 91 mmol/L — ABNORMAL LOW (ref 98–111)
Creatinine, Ser: 0.49 mg/dL (ref 0.44–1.00)
GFR, Estimated: >60 mL/min (ref >60)
Glucose, Bld: 101 mg/dL — ABNORMAL HIGH (ref 70–99)
Potassium: 3 mmol/L — ABNORMAL LOW (ref 3.5–5.1)
Sodium: 128 mmol/L — ABNORMAL LOW (ref 135–145)
Total Bilirubin: 1.1 mg/dL (ref 0.3–1.2)
Total Protein: 5.5 g/dL — ABNORMAL LOW (ref 6.5–8.1)

## 2021-09-27 LAB — UREA NITROGEN, URINE: Urea Nitrogen, Ur: 390 mg/dL

## 2021-09-27 LAB — MAGNESIUM: Magnesium: 1.6 mg/dL — ABNORMAL LOW (ref 1.7–2.4)

## 2021-09-27 MED ORDER — POTASSIUM CHLORIDE CRYS ER 20 MEQ PO TBCR
40.0000 meq | EXTENDED_RELEASE_TABLET | ORAL | Status: AC
  Administered 2021-09-27 (×2): 40 meq via ORAL
  Filled 2021-09-27 (×2): qty 2

## 2021-09-27 MED ORDER — MAGNESIUM SULFATE 2 GM/50ML IV SOLN
2.0000 g | Freq: Once | INTRAVENOUS | Status: AC
Start: 2021-09-27 — End: 2021-09-27
  Administered 2021-09-27: 2 g via INTRAVENOUS
  Filled 2021-09-27: qty 50

## 2021-09-27 NOTE — Plan of Care (Signed)
  Problem: Health Behavior/Discharge Planning: Goal: Ability to manage health-related needs will improve Outcome: Progressing   Problem: Clinical Measurements: Goal: Diagnostic test results will improve Outcome: Progressing   Problem: Activity: Goal: Risk for activity intolerance will decrease Outcome: Progressing   Problem: Safety: Goal: Ability to remain free from injury will improve Outcome: Progressing   Problem: Skin Integrity: Goal: Risk for impaired skin integrity will decrease Outcome: Progressing

## 2021-09-27 NOTE — Progress Notes (Signed)
  Progress Note   Patient: Brooke Weber HER:[id number] DOB: [birth year] DOA: 09/25/2021     1 DOS: the patient was seen and examined on 09/27/2021   Brief hospital course: 81 year old female history of hypertension, chronic pain syndrome, reflux presents to the ER today with reported hyponatremia from the office.  She was seen last week after a fall.  She was noted to have hyponatremia but she did not want a wait in the ER for evaluation.  She has been having intermittent falling and intermittent confusion reportedly.  Family is not at the bedside.  She was seen in December 2022 in the ER was also noted to have hyponatremia with a serum sodium of 125.  Assessment and Plan: * Chronic hyponatremia - Patient's hyponatremia appears to be chronic as far back as December 2022.  Her serum sodium based on the last 3 lab entry is have been 125.   - Appears euvolemic on exam. Urine Na 51 -Cont on water restrictions.   -Sodium continues to trend up slowly, up to 128 today -Recommend to continue avoiding HCTZ from this point on -repeat bmet in AM  Hypokalemia -Remains low this AM, will correct -Repeat bmet in AM  Hypomagnesemia -Will replace   Mild cognitive impairment Stable.   Long-term current use of opiate analgesic -Cont home regimen as tolerated   Gastroesophageal reflux disease -Continue Prilosec 20 mg daily.   Essential hypertension -Hold HCTZ per above -cont needed clonidine for now. -Resumed home lisinopril and atenolol -BP stable and controlled   Chronic pain syndrome -Cont home pain regimen       Subjective: Reports feeling better today, noted feeling more tired this AM  Physical Exam: Vitals:   09/26/21 1717 09/26/21 2114 09/27/21 0502 09/27/21 0932  BP: (!) 145/65 (!) 173/73 (!) 156/72 (!) 118/99  Pulse: 62 75 76 97  Resp: '16 18 18 17  '$ Temp: (!) 97.3 F (36.3 C) 97.9 F (36.6 C) 97.6 F (36.4 C) 98.3 F (36.8 C)  TempSrc: Oral Oral Oral   SpO2: 96% 99% 97%  100%  Weight:   68.4 kg   Height:       General exam: Conversant, in no acute distress Respiratory system: normal chest rise, clear, no audible wheezing Cardiovascular system: regular rhythm, s1-s2 Gastrointestinal system: Nondistended, nontender, pos BS Central nervous system: No seizures, no tremors Extremities: No cyanosis, no joint deformities Skin: No rashes, no pallor Psychiatry: Affect normal // no auditory hallucinations   Data Reviewed:  Labs reviewed: Na 128, K 3.0, Cr 0.49  Family Communication: Pt in room, family at bedside  Disposition: Status is: Inpatient Continue inpatient stay because: Severity of illness  Planned Discharge Destination: Home    Author: Marylu Lund, MD 09/27/2021 3:23 PM  For on call review [email or web address].

## 2021-09-27 NOTE — Progress Notes (Signed)
Mobility Specialist Criteria Algorithm Info.   09/27/21 1045  Mobility  Activity Dangled on edge of bed;Refused mobility   Patient declined further mobility for unspecified reasons. Stated she just got back in bed not long ago and is tired from not getting rest last night. Asked to return later, will f/u as time permits.   09/27/2021 11:21 AM  [place] Lanita Stammen, [place], [place]  BBUYZ:[phone] Office: [phone]

## 2021-09-27 NOTE — Progress Notes (Signed)
Consult received for IV placement. After reviewing the chart this patient has no IV infusions/tests ordered and denies nausea at this time. At this time the patient has declined an IV start. Notified nurse. Fran Lowes, RN VAST

## 2021-09-27 NOTE — Progress Notes (Signed)
Physical Therapy Treatment Patient Details Name: Brooke Weber MRN: [id number] DOB: 1941-01-11 Today's Date: 09/27/2021   History of Present Illness 81 year old female presents to the ER 09/25/21 from PCP with reported hyponatremia. Seen in ED last week after a fall, but did not want to wait. Pt has and increased confusion since last week. PMH: hypertension, chronic pain syndrome, reflux.    PT Comments    Pt cognition somewhat improved however continues to be disoriented to time and situation and continues to have poor safety awareness with use of RW. Overall though she is making good progress towards her goals and mod I for bed mobility and supervision for transfers and straight line ambulation, often needs minA for navigation of RW around obstacles. Pt is light min A for ascent/descent of 3 steps and would be able to easily get into her home. D/c plans remain appropriate at this time. PT will continue to follow acutely.   Recommendations for follow up therapy are one component of a multi-disciplinary discharge planning process, led by the attending physician.  Recommendations may be updated based on patient status, additional functional criteria and insurance authorization.  Follow Up Recommendations  Home health PT     Assistance Recommended at Discharge Frequent or constant Supervision/Assistance  Patient can return home with the following A little help with walking and/or transfers;A little help with bathing/dressing/bathroom;Assistance with cooking/housework;Direct supervision/assist for medications management;Direct supervision/assist for financial management;Assist for transportation;Help with stairs or ramp for entrance   Equipment Recommendations  None recommended by PT       Precautions / Restrictions Precautions Precautions: Fall Restrictions Weight Bearing Restrictions: No     Mobility  Bed Mobility Overal bed mobility: Modified Independent             General  bed mobility comments: HoB elevated and use of bedrail    Transfers Overall transfer level: Needs assistance Equipment used: Rolling walker (2 wheels) Transfers: Sit to/from Stand Sit to Stand: Supervision           General transfer comment: supervision for power up and self steady from bed to RW    Ambulation/Gait Ambulation/Gait assistance: Supervision, Min assist Gait Distance (Feet): 500 Feet Assistive device: Rolling walker (2 wheels) Gait Pattern/deviations: Step-through pattern, Trunk flexed Gait velocity: WFL Gait velocity interpretation: 1.31 - 2.62 ft/sec, indicative of limited community ambulator   General Gait Details: generally supervision for safety, however needs occassional minA for managing RW around obstacles   Stairs Stairs: Yes Stairs assistance: Min assist Stair Management: Forwards, Two rails, Step to pattern Number of Stairs: 3 General stair comments: able to ascend/descend 3 steps with light min A for steadying         Balance Overall balance assessment: Mild deficits observed, not formally tested                                          Cognition Arousal/Alertness: Awake/alert Behavior During Therapy: WFL for tasks assessed/performed Overall Cognitive Status: Impaired/Different from baseline Area of Impairment: Orientation, Memory, Following commands, Safety/judgement, Awareness, Problem solving                 Orientation Level: Disoriented to, Time, Situation   Memory: Decreased short-term memory Following Commands: Follows one step commands consistently, Follows multi-step commands with increased time Safety/Judgement: Decreased awareness of safety, Decreased awareness of deficits Awareness: Anticipatory Problem Solving: Requires verbal cues, Requires  tactile cues, Difficulty sequencing General Comments: continues to be disoriented to time and situation, better command follow, but continues to have safety  awareness issues with navagation of DME           General Comments General comments (skin integrity, edema, etc.): VSS on RA      Pertinent Vitals/Pain Pain Assessment Pain Assessment: Faces Faces Pain Scale: Hurts a little bit Pain Location: generalized with movement Pain Descriptors / Indicators: Grimacing, Guarding Pain Intervention(s): Limited activity within patient's tolerance, Monitored during session, Repositioned     PT Goals (current goals can now be found in the care plan section) Acute Rehab PT Goals Patient Stated Goal: go home today PT Goal Formulation: With patient Time For Goal Achievement: 10/10/21 Potential to Achieve Goals: Good Progress towards PT goals: Progressing toward goals    Frequency    Min 3X/week      PT Plan Current plan remains appropriate       AM-PAC PT "6 Clicks" Mobility   Outcome Measure  Help needed turning from your back to your side while in a flat bed without using bedrails?: None Help needed moving from lying on your back to sitting on the side of a flat bed without using bedrails?: None Help needed moving to and from a bed to a chair (including a wheelchair)?: A Little Help needed standing up from a chair using your arms (e.g., wheelchair or bedside chair)?: A Little Help needed to walk in hospital room?: A Little Help needed climbing 3-5 steps with a railing? : A Little 6 Click Score: 20    End of Session Equipment Utilized During Treatment: Gait belt Activity Tolerance: Patient tolerated treatment well Patient left: in chair;with call bell/phone within reach;with chair alarm set Nurse Communication: Mobility status PT Visit Diagnosis: Other abnormalities of gait and mobility (R26.89);Muscle weakness (generalized) (M62.81);Repeated falls (R29.6);Difficulty in walking, not elsewhere classified (R26.2);Unsteadiness on feet (R26.81)     Time: 7322-0254 PT Time Calculation (min) (ACUTE ONLY): 25 min  Charges:  $Gait  Training: 8-22 mins $Therapeutic Activity: 8-22 mins                     Tayja Manzer B. Migdalia Dk PT, DPT Acute Rehabilitation Services Please use secure chat or  Call Office [phone]    [place] 09/27/2021, 4:08 PM

## 2021-09-28 ENCOUNTER — Other Ambulatory Visit (HOSPITAL_COMMUNITY): Payer: Self-pay

## 2021-09-28 DIAGNOSIS — E871 Hypo-osmolality and hyponatremia: Secondary | ICD-10-CM | POA: Diagnosis not present

## 2021-09-28 DIAGNOSIS — I10 Essential (primary) hypertension: Secondary | ICD-10-CM | POA: Diagnosis not present

## 2021-09-28 DIAGNOSIS — E876 Hypokalemia: Secondary | ICD-10-CM | POA: Diagnosis not present

## 2021-09-28 LAB — COMPREHENSIVE METABOLIC PANEL
ALT: 13 U/L (ref 0–44)
AST: 21 U/L (ref 15–41)
Albumin: 3.2 g/dL — ABNORMAL LOW (ref 3.5–5.0)
Alkaline Phosphatase: 78 U/L (ref 38–126)
Anion gap: 8 (ref 5–15)
BUN: 8 mg/dL (ref 8–23)
CO2: 25 mmol/L (ref 22–32)
Calcium: 9 mg/dL (ref 8.9–10.3)
Chloride: 95 mmol/L — ABNORMAL LOW (ref 98–111)
Creatinine, Ser: 0.58 mg/dL (ref 0.44–1.00)
GFR, Estimated: >60 mL/min (ref >60)
Glucose, Bld: 87 mg/dL (ref 70–99)
Potassium: 4.5 mmol/L (ref 3.5–5.1)
Sodium: 128 mmol/L — ABNORMAL LOW (ref 135–145)
Total Bilirubin: 1 mg/dL (ref 0.3–1.2)
Total Protein: 5.4 g/dL — ABNORMAL LOW (ref 6.5–8.1)

## 2021-09-28 LAB — URINE CULTURE: Culture: >=100000 — AB

## 2021-09-28 LAB — MAGNESIUM: Magnesium: 2 mg/dL (ref 1.7–2.4)

## 2021-09-28 MED ORDER — LISINOPRIL 20 MG PO TABS
20.0000 mg | ORAL_TABLET | Freq: Every day | ORAL | 0 refills | Status: DC
  Filled 2021-09-28: qty 30, 30d supply, fill #0

## 2021-09-28 NOTE — TOC Transition Note (Signed)
Transition of Care [facility]) - CM/SW Discharge Note   Patient Details  Name: Brooke Weber MRN: [id number] Date of Birth: 05-08-1940  [facility] CM/SW Contact:  Tom-Johnson, Renea Ee, RN Phone Number: 09/28/2021, 1:11 PM   Clinical Narrative:     Patient is scheduled for discharge today. Home health info on AVS. Family to transport at discharge. No further TOC needs noted.   Final next level of care: [place] Barriers to Discharge: Barriers Resolved   Patient Goals and CMS Choice Patient states their goals for this hospitalization and ongoing recovery are:: To return home CMS Medicare.gov Compare Post Acute Care list provided to:: Patient Choice offered to / list presented to : Patient, Spouse  Discharge Placement                Patient to be transferred to facility by: Family      Discharge Plan and Services   Discharge Planning Services: CM Consult Post Acute Care Choice: Home Health          DME Arranged: N/A DME Agency: NA       HH Arranged: PT, OT HH Agency: [facility] Date [place]: 09/26/21 Time HH Agency Contacted: 1400 Representative spoke with at [place]: Delsa Sale  Social Determinants of Health ([place]) Interventions     Readmission Risk Interventions     No data to display

## 2021-09-28 NOTE — Progress Notes (Signed)
Discharge order in place.  Lisinopril from [facility] received, given to patient.  Discharge instructions given to patient and spouse, Shea Stakes, both verbalized understanding.  Encouraged to call the doctor for concerns.  Discharged home.

## 2021-09-28 NOTE — Progress Notes (Signed)
Physical Therapy Treatment Patient Details Name: Brooke Weber MRN: [id number] DOB: Jan 30, 1941 Today's Date: 09/28/2021   History of Present Illness 81 year old female presents to the ER 09/25/21 from PCP with reported hyponatremia. Seen in ED last week after a fall, but did not want to wait. Pt has and increased confusion since last week. PMH: hypertension, chronic pain syndrome, reflux.    PT Comments    Pt supine in bed, holding husband's hand who is supine in the recliner. Agreeable to getting up to walk with therapy. Pt is much more interactive today, stating she is feeling much better. Pt mobility continues to be limited by poor safety awareness especially with management of RW, in presence of decreased balance. Pt is mod I for bed mobility, and transfers and generally supervision for ambulation with RW but requires max verbal cuing for navigation around obstacles and 1x min A for LoB due to running into cleaning cart. D/c plan remains appropriate at this time. PT will continue to follow acutely.   Recommendations for follow up therapy are one component of a multi-disciplinary discharge planning process, led by the attending physician.  Recommendations may be updated based on patient status, additional functional criteria and insurance authorization.  Follow Up Recommendations  Home health PT     Assistance Recommended at Discharge Frequent or constant Supervision/Assistance  Patient can return home with the following A little help with walking and/or transfers;A little help with bathing/dressing/bathroom;Assistance with cooking/housework;Direct supervision/assist for medications management;Direct supervision/assist for financial management;Assist for transportation;Help with stairs or ramp for entrance   Equipment Recommendations  None recommended by PT       Precautions / Restrictions Precautions Precautions: Fall Restrictions Weight Bearing Restrictions: No     Mobility   Bed Mobility Overal bed mobility: Modified Independent             General bed mobility comments: HoB elevated and use of bedrail    Transfers Overall transfer level: Needs assistance Equipment used: None Transfers: Sit to/from Stand Sit to Stand: Modified independent (Device/Increase time)           General transfer comment: use of bed rail to push up    Ambulation/Gait Ambulation/Gait assistance: Supervision, Min assist Gait Distance (Feet): 600 Feet Assistive device: Rolling walker (2 wheels) Gait Pattern/deviations: Step-through pattern, Trunk flexed Gait velocity: WFL Gait velocity interpretation: >2.62 ft/sec, indicative of community ambulatory   General Gait Details: likely baseline level of function, increased ambulation velocity, however continues to have poor safety awareness with RW navigation around obstacles in hallway despite constant vc to look out and keep close to her walker, 1x LoB requiring min A to steady         Balance Overall balance assessment: Mild deficits observed, not formally tested (decreased balance with mobility due to poor safety awareness)                                          Cognition Arousal/Alertness: Awake/alert Behavior During Therapy: WFL for tasks assessed/performed Overall Cognitive Status: Impaired/Different from baseline Area of Impairment: Orientation, Memory, Following commands, Safety/judgement, Awareness, Problem solving                 Orientation Level: Disoriented to, Time, Situation   Memory: Decreased short-term memory Following Commands: Follows one step commands consistently, Follows multi-step commands with increased time Safety/Judgement: Decreased awareness of safety, Decreased awareness of deficits  Awareness: Anticipatory Problem Solving: Requires verbal cues, Requires tactile cues, Difficulty sequencing General Comments: safety awareness continues to be biggest cognitive  issure           General Comments General comments (skin integrity, edema, etc.): VSS on RA      Pertinent Vitals/Pain Pain Assessment Pain Assessment: No/denies pain     PT Goals (current goals can now be found in the care plan section) Acute Rehab PT Goals Patient Stated Goal: go home today PT Goal Formulation: With patient Time For Goal Achievement: 10/10/21 Potential to Achieve Goals: Good Progress towards PT goals: Progressing toward goals    Frequency    Min 3X/week      PT Plan Current plan remains appropriate       AM-PAC PT "6 Clicks" Mobility   Outcome Measure  Help needed turning from your back to your side while in a flat bed without using bedrails?: None Help needed moving from lying on your back to sitting on the side of a flat bed without using bedrails?: None Help needed moving to and from a bed to a chair (including a wheelchair)?: A Little Help needed standing up from a chair using your arms (e.g., wheelchair or bedside chair)?: A Little Help needed to walk in hospital room?: A Little Help needed climbing 3-5 steps with a railing? : A Little 6 Click Score: 20    End of Session Equipment Utilized During Treatment: Gait belt Activity Tolerance: Patient tolerated treatment well Patient left: with call bell/phone within reach;with chair alarm set;in bed Nurse Communication: Mobility status PT Visit Diagnosis: Other abnormalities of gait and mobility (R26.89);Muscle weakness (generalized) (M62.81);Repeated falls (R29.6);Difficulty in walking, not elsewhere classified (R26.2);Unsteadiness on feet (R26.81)     Time: 1000-1016 PT Time Calculation (min) (ACUTE ONLY): 16 min  Charges:  $Therapeutic Activity: 8-22 mins                     Brooke Weber PT, DPT Acute Rehabilitation Services Please use secure chat or  Call Office [phone]    [place] 09/28/2021, 12:12 PM

## 2021-09-28 NOTE — Care Management Important Message (Signed)
Important Message  Patient Details  Name: Brooke Weber MRN: [id number] Date of Birth: 10-Jan-1941   Medicare Important Message Given:  Yes     Orbie Pyo 09/28/2021, 3:39 PM

## 2021-09-28 NOTE — Discharge Summary (Addendum)
Physician Discharge Summary   Patient: Brooke Weber MRN: [id number] DOB: 12/06/1940  Admit date:     09/25/2021  Discharge date: 09/28/21  Discharge Physician: Marylu Lund   PCP: Lawerance Cruel, MD   Recommendations at discharge:    Follow up with PCP in 1-2 weeks Recommend repeat bmet in 1 week, focus on sodium  Discharge Diagnoses: Principal Problem:   Chronic hyponatremia Active Problems:   Chronic pain syndrome   Essential hypertension   Gastroesophageal reflux disease   Long-term current use of opiate analgesic   Mild cognitive impairment   Hypokalemia   Hyponatremia  Resolved Problems:   * No resolved hospital problems. *  Hospital Course: 81 year old female history of hypertension, chronic pain syndrome, reflux presents to the ER today with reported hyponatremia from the office.  She was seen last week after a fall.  She was noted to have hyponatremia but she did not want a wait in the ER for evaluation.  She has been having intermittent falling and intermittent confusion reportedly.  Family is not at the bedside.  She was seen in December 2022 in the ER was also noted to have hyponatremia with a serum sodium of 125.  Assessment and Plan: * Chronic hyponatremia - Patient's hyponatremia appears to be chronic as far back as December 2022.  Her serum sodium based on the last 3 lab entry is have been 125.   - Appears euvolemic on exam. Urine Na 51 -Sodium trending up slowly, up to 128  -Pt remains asymptomatic -Recommend to continue avoiding HCTZ and lexapro from this point on -Recommend repeat bmet in 1 week   Hypokalemia -corrected   Hypomagnesemia -replaced   Mild cognitive impairment Stable.   Long-term current use of opiate analgesic -Cont home regimen as tolerated   Gastroesophageal reflux disease -Continue Prilosec 20 mg daily.   Essential hypertension -Hold HCTZ per above -cont needed clonidine for now. -Resumed home lisinopril and  atenolol -BP remained stable and controlled   Chronic pain syndrome -Cont home pain regimen   Asymptomatic Bacteriuria -UA with large leukocytes and bacteria present -Urine cx notable for >100,000 ecoli resistant to ampicillin and unasyn, sensitive to all others -Pt is asymptomatic - without abd pain, nausea, or dysura. No fevers or leukocytosis -Remained stable off abx     Consultants:  Procedures performed:   Disposition: Home Diet recommendation:  Regular diet DISCHARGE MEDICATION: Allergies as of 09/28/2021       Reactions   Aspirin Anaphylaxis   Bee Venom Anaphylaxis   Iodine Anaphylaxis   Other Anaphylaxis   nuts   Penicillins Anaphylaxis   Has patient had a PCN reaction causing immediate rash, facial/tongue/throat swelling, SOB or lightheadedness with hypotension: No Has patient had a PCN reaction causing severe rash involving mucus membranes or skin necrosis: No Has patient had a PCN reaction that required hospitalization: No Has patient had a PCN reaction occurring within the last 10 years: No If all of the above answers are "NO", then may proceed with Cephalosporin use.        Medication List     STOP taking these medications    escitalopram 5 MG tablet Commonly known as: LEXAPRO   lisinopril-hydrochlorothiazide 20-12.5 MG tablet Commonly known as: ZESTORETIC       TAKE these medications    acetaminophen 325 MG tablet Commonly known as: TYLENOL Take 650 mg by mouth every 6 (six) hours as needed for mild pain.   ALPRAZolam 0.5 MG tablet Commonly known  as: XANAX Take 0.25 mg by mouth daily as needed for anxiety.   atenolol 100 MG tablet Commonly known as: TENORMIN Take 100 mg by mouth at bedtime.   atorvastatin 20 MG tablet Commonly known as: LIPITOR Take 20 mg by mouth at bedtime.   lisinopril 20 MG tablet Commonly known as: ZESTRIL Take 1 tablet (20 mg total) by mouth daily. Start taking on: September 29, 2021   omeprazole 20 MG  capsule Commonly known as: PRILOSEC Take 20 mg by mouth daily.   oxyCODONE-acetaminophen 10-325 MG tablet Commonly known as: PERCOCET Take 1 tablet by mouth every 6 (six) hours as needed for pain.   polyethylene glycol 17 g packet Commonly known as: MIRALAX / GLYCOLAX Take 17 g by mouth daily.   Proctosol HC 2.5 % rectal cream Generic drug: hydrocortisone Place 1 application rectally daily as needed for anal itching.   Systane 0.4-0.3 % Soln Generic drug: Polyethyl Glycol-Propyl Glycol Place 2 drops into both eyes 3 (three) times daily.        [facility].   Specialty: Home Health Services Why: someone will call you to schedule first home visit. Contact information: [place] [address] [phone]         Lawerance Cruel, MD Follow up in 1 week(s).   Specialty: Family Medicine Why: Hospital follow up Contact information: [place] [id number] [phone]                Discharge Exam: Danley Danker Weights   09/26/21 0144 09/26/21 0350 09/27/21 0502  Weight: 80 kg 67.4 kg 68.4 kg   General exam: Awake, laying in bed, in nad Respiratory system: Normal respiratory effort, no wheezing Cardiovascular system: regular rate, s1, s2 Gastrointestinal system: Soft, nondistended, positive BS Central nervous system: CN2-12 grossly intact, strength intact Extremities: Perfused, no clubbing Skin: Normal skin turgor, no notable skin lesions seen Psychiatry: Mood normal // no visual hallucinations   Condition at discharge: fair  The results of significant diagnostics from this hospitalization (including imaging, microbiology, ancillary and laboratory) are listed below for reference.   Imaging Studies: DG Shoulder Left  Result Date: 09/26/2021 CLINICAL DATA:  Fall.  Abnormal chest x-ray. EXAM: LEFT SHOULDER - 2+ VIEW COMPARISON:  Chest radiograph 09/25/2021.  Left humerus 07/31/2003 and fluoroscopy 08/04/2003 FINDINGS: Deformity and sclerosis in the proximal left humerus. No definite acute cortical changes are demonstrated. This likely represents old healed deformity relating to previous left humeral fracture and subsequent internal fixation from 2005. Degenerative changes in the glenohumeral and acromioclavicular joints. IMPRESSION: Deformity and sclerosis in the left proximal humerus likely representing old fracture deformity. No definite acute fractures demonstrated. Electronically Signed   By: Lucienne Capers M.D.   On: 09/26/2021 00:39   CT Head Wo Contrast  Result Date: 09/25/2021 CLINICAL DATA:  Minor head trauma. Multiple recent falls. Bruising around the left eye and forehead. EXAM: CT HEAD WITHOUT CONTRAST CT MAXILLOFACIAL WITHOUT CONTRAST CT CERVICAL SPINE WITHOUT CONTRAST TECHNIQUE: Multidetector CT imaging of the head, cervical spine, and maxillofacial structures were performed using the standard protocol without intravenous contrast. Multiplanar CT image reconstructions of the cervical spine and maxillofacial structures were also generated. RADIATION DOSE REDUCTION: This exam was performed according to the departmental dose-optimization program which includes automated exposure control, adjustment of the mA and/or kV according to patient size and/or use of iterative reconstruction technique. COMPARISON:  CT head  and cervical spine 02/08/2021 FINDINGS: CT HEAD FINDINGS Brain: Diffuse cerebral atrophy. Ventricular dilatation consistent with central atrophy. Low-attenuation changes in the deep white matter consistent with small vessel ischemia. No abnormal extra-axial fluid collections. No mass effect or midline shift. Gray-white matter junctions are distinct. Basal cisterns are not effaced. No acute intracranial hemorrhage. Vascular: No hyperdense vessel or unexpected calcification. Skull: Calvarium appears intact. Other: None. CT MAXILLOFACIAL FINDINGS  Osseous: No fracture or mandibular dislocation. No destructive process. Poor dentition. Orbits: Negative. No traumatic or inflammatory finding. Sinuses: Clear. Soft tissues: Mild subcutaneous scalp hematoma over the left supraorbital region. Metallic structure demonstrated in the floor of the oral cavity towards the left probably representing a piercing. CT CERVICAL SPINE FINDINGS Alignment: Normal alignment. Skull base and vertebrae: Skull base appears intact. No vertebral compression. No focal bone lesion or bone destruction. Soft tissues and spinal canal: No prevertebral soft tissue swelling. No abnormal paraspinal soft tissue mass or infiltration. Disc levels: Postoperative changes with anterior plate and screw fixation and intervertebral fusion from C4 through C7. Fused segments appear intact. Surgical hardware appears intact without change since prior study. Upper chest: Lung apices are clear. Other: None. IMPRESSION: 1. No acute intracranial abnormalities. Chronic atrophy and small vessel ischemic changes. 2. No acute displaced orbital or facial fractures identified. 3. Postoperative anterior fixation with intervertebral fusion from C4 through C7. Normal alignment of the cervical spine. No acute displaced fractures identified. Electronically Signed   By: Lucienne Capers M.D.   On: 09/25/2021 21:44   CT Cervical Spine Wo Contrast  Result Date: 09/25/2021 CLINICAL DATA:  Minor head trauma. Multiple recent falls. Bruising around the left eye and forehead. EXAM: CT HEAD WITHOUT CONTRAST CT MAXILLOFACIAL WITHOUT CONTRAST CT CERVICAL SPINE WITHOUT CONTRAST TECHNIQUE: Multidetector CT imaging of the head, cervical spine, and maxillofacial structures were performed using the standard protocol without intravenous contrast. Multiplanar CT image reconstructions of the cervical spine and maxillofacial structures were also generated. RADIATION DOSE REDUCTION: This exam was performed according to the departmental  dose-optimization program which includes automated exposure control, adjustment of the mA and/or kV according to patient size and/or use of iterative reconstruction technique. COMPARISON:  CT head and cervical spine 02/08/2021 FINDINGS: CT HEAD FINDINGS Brain: Diffuse cerebral atrophy. Ventricular dilatation consistent with central atrophy. Low-attenuation changes in the deep white matter consistent with small vessel ischemia. No abnormal extra-axial fluid collections. No mass effect or midline shift. Gray-white matter junctions are distinct. Basal cisterns are not effaced. No acute intracranial hemorrhage. Vascular: No hyperdense vessel or unexpected calcification. Skull: Calvarium appears intact. Other: None. CT MAXILLOFACIAL FINDINGS Osseous: No fracture or mandibular dislocation. No destructive process. Poor dentition. Orbits: Negative. No traumatic or inflammatory finding. Sinuses: Clear. Soft tissues: Mild subcutaneous scalp hematoma over the left supraorbital region. Metallic structure demonstrated in the floor of the oral cavity towards the left probably representing a piercing. CT CERVICAL SPINE FINDINGS Alignment: Normal alignment. Skull base and vertebrae: Skull base appears intact. No vertebral compression. No focal bone lesion or bone destruction. Soft tissues and spinal canal: No prevertebral soft tissue swelling. No abnormal paraspinal soft tissue mass or infiltration. Disc levels: Postoperative changes with anterior plate and screw fixation and intervertebral fusion from C4 through C7. Fused segments appear intact. Surgical hardware appears intact without change since prior study. Upper chest: Lung apices are clear. Other: None. IMPRESSION: 1. No acute intracranial abnormalities. Chronic atrophy and small vessel ischemic changes. 2. No acute displaced orbital or facial fractures identified. 3. Postoperative anterior fixation  with intervertebral fusion from C4 through C7. Normal alignment of the  cervical spine. No acute displaced fractures identified. Electronically Signed   By: Lucienne Capers M.D.   On: 09/25/2021 21:44   CT Maxillofacial Wo Contrast  Result Date: 09/25/2021 CLINICAL DATA:  Minor head trauma. Multiple recent falls. Bruising around the left eye and forehead. EXAM: CT HEAD WITHOUT CONTRAST CT MAXILLOFACIAL WITHOUT CONTRAST CT CERVICAL SPINE WITHOUT CONTRAST TECHNIQUE: Multidetector CT imaging of the head, cervical spine, and maxillofacial structures were performed using the standard protocol without intravenous contrast. Multiplanar CT image reconstructions of the cervical spine and maxillofacial structures were also generated. RADIATION DOSE REDUCTION: This exam was performed according to the departmental dose-optimization program which includes automated exposure control, adjustment of the mA and/or kV according to patient size and/or use of iterative reconstruction technique. COMPARISON:  CT head and cervical spine 02/08/2021 FINDINGS: CT HEAD FINDINGS Brain: Diffuse cerebral atrophy. Ventricular dilatation consistent with central atrophy. Low-attenuation changes in the deep white matter consistent with small vessel ischemia. No abnormal extra-axial fluid collections. No mass effect or midline shift. Gray-white matter junctions are distinct. Basal cisterns are not effaced. No acute intracranial hemorrhage. Vascular: No hyperdense vessel or unexpected calcification. Skull: Calvarium appears intact. Other: None. CT MAXILLOFACIAL FINDINGS Osseous: No fracture or mandibular dislocation. No destructive process. Poor dentition. Orbits: Negative. No traumatic or inflammatory finding. Sinuses: Clear. Soft tissues: Mild subcutaneous scalp hematoma over the left supraorbital region. Metallic structure demonstrated in the floor of the oral cavity towards the left probably representing a piercing. CT CERVICAL SPINE FINDINGS Alignment: Normal alignment. Skull base and vertebrae: Skull base  appears intact. No vertebral compression. No focal bone lesion or bone destruction. Soft tissues and spinal canal: No prevertebral soft tissue swelling. No abnormal paraspinal soft tissue mass or infiltration. Disc levels: Postoperative changes with anterior plate and screw fixation and intervertebral fusion from C4 through C7. Fused segments appear intact. Surgical hardware appears intact without change since prior study. Upper chest: Lung apices are clear. Other: None. IMPRESSION: 1. No acute intracranial abnormalities. Chronic atrophy and small vessel ischemic changes. 2. No acute displaced orbital or facial fractures identified. 3. Postoperative anterior fixation with intervertebral fusion from C4 through C7. Normal alignment of the cervical spine. No acute displaced fractures identified. Electronically Signed   By: Lucienne Capers M.D.   On: 09/25/2021 21:44   DG Chest Portable 1 View  Result Date: 09/25/2021 CLINICAL DATA:  LOC EXAM: PORTABLE CHEST 1 VIEW COMPARISON:  03/03/2014 FINDINGS: Hardware in the cervicothoracic spine. No acute airspace disease or effusion. Stable cardiomediastinal silhouette with aortic atherosclerosis. No pneumothorax. Possible age indeterminate left humeral neck fracture, question deformity at the left humerus on 2015 comparison IMPRESSION: 1. Clear lung fields. 2. Possible age indeterminate left humeral neck fracture, correlate for point tenderness with follow-up dedicated shoulder radiographs if indicated. Electronically Signed   By: Donavan Foil M.D.   On: 09/25/2021 21:42    Microbiology: Results for orders placed or performed during the hospital encounter of 09/25/21  Urine Culture     Status: Abnormal   Collection Time: 09/25/21  8:23 PM   Specimen: Urine, Clean Catch  Result Value Ref Range Status   Specimen Description   Final    URINE, CLEAN CATCH Performed at [facility], [address] [place], [place] [address]    Special Requests    Final    NONE Performed at [facility], [address], [place], [place] [address]  Culture >=100,000 COLONIES/mL ESCHERICHIA COLI (A)  Final   Report Status 09/28/2021 FINAL  Final   Organism ID, Bacteria ESCHERICHIA COLI (A)  Final      Susceptibility   Escherichia coli - MIC*    AMPICILLIN >=32 RESISTANT Resistant     CEFAZOLIN 8 SENSITIVE Sensitive     CEFEPIME <=0.12 SENSITIVE Sensitive     CEFTRIAXONE <=0.25 SENSITIVE Sensitive     CIPROFLOXACIN <=0.25 SENSITIVE Sensitive     GENTAMICIN <=1 SENSITIVE Sensitive     IMIPENEM <=0.25 SENSITIVE Sensitive     NITROFURANTOIN <=16 SENSITIVE Sensitive     TRIMETH/SULFA <=20 SENSITIVE Sensitive     AMPICILLIN/SULBACTAM >=32 RESISTANT Resistant     PIP/TAZO <=4 SENSITIVE Sensitive     * >=100,000 COLONIES/mL ESCHERICHIA COLI    Labs: CBC: Recent Labs  Lab 09/25/21 1837  WBC 9.5  HGB 12.7  HCT 35.9*  MCV 88.6  PLT 829   Basic Metabolic Panel: Recent Labs  Lab 09/25/21 1837 09/26/21 0100 09/26/21 0500 09/27/21 0327 09/28/21 0505  NA 125* 125* 127* 128* 128*  K 3.3* 2.7* 3.0* 3.0* 4.5  CL 86* 87* 87* 91* 95*  CO2 '27 29 29 27 25  '$ GLUCOSE 129* 126* 123* 101* 87  BUN 11 10 6* 5* 8  CREATININE 0.72 0.90 0.57 0.49 0.58  CALCIUM 9.3 9.1 9.0 8.9 9.0  MG  --   --   --  1.6* 2.0   Liver Function Tests: Recent Labs  Lab 09/27/21 0327 09/28/21 0505  AST 20 21  ALT 14 13  ALKPHOS 86 78  BILITOT 1.1 1.0  PROT 5.5* 5.4*  ALBUMIN 3.3* 3.2*   CBG: No results for input(s): "GLUCAP" in the last 168 hours.  Discharge time spent: less than 30 minutes.  Signed: Marylu Lund, MD [facility] 09/28/2021

## 2021-09-28 NOTE — Progress Notes (Signed)
Mobility Specialist Criteria Algorithm Info.   09/28/21 1015  Mobility  Activity Transferred from chair to bed  Range of Motion/Exercises Active;All extremities  Level of Assistance Contact guard assist, steadying assist  Assistive Device None  Activity Response Tolerated well   Patient received in chair with alarm active, attempting to stand without assistance. Just finished working with PT minutes prior, deferred to sit longer for unspecified reasons. Despite encouragement, pt requested assistance back to bed. Transferred back to bed min guard and required minimal assistance for bed mobility. Was left in supine with all needs met, call bell in reach.   09/28/2021 11:16 AM  [place] [facility], [place], [place]  HLKTG:[phone] Office: [phone]

## 2021-10-02 DIAGNOSIS — E782 Mixed hyperlipidemia: Secondary | ICD-10-CM | POA: Diagnosis not present

## 2021-10-02 DIAGNOSIS — I10 Essential (primary) hypertension: Secondary | ICD-10-CM | POA: Diagnosis not present

## 2021-10-02 DIAGNOSIS — M1990 Unspecified osteoarthritis, unspecified site: Secondary | ICD-10-CM | POA: Diagnosis not present

## 2021-10-02 DIAGNOSIS — K219 Gastro-esophageal reflux disease without esophagitis: Secondary | ICD-10-CM | POA: Diagnosis not present

## 2021-10-05 ENCOUNTER — Other Ambulatory Visit: Payer: Self-pay

## 2021-10-05 DIAGNOSIS — E782 Mixed hyperlipidemia: Secondary | ICD-10-CM

## 2021-10-11 ENCOUNTER — Telehealth: Payer: Self-pay | Admitting: *Deleted

## 2021-10-11 NOTE — Chronic Care Management (AMB) (Signed)
  Care Coordination  Outreach Note  10/11/2021 Name: Brooke Weber MRN: [id number] DOB: 1940/03/22   Care Coordination Outreach Attempts  An unsuccessful telephone outreach was attempted today to offer the patient information about available care coordination services as a benefit of their health plan.   Received referral   Follow Up Plan:  Additional outreach attempts will be made to offer the patient care coordination information and services.   Encounter Outcome:  No Answer  Julian Hy, [place] Direct Dial: [phone]

## 2021-10-16 NOTE — Chronic Care Management (AMB) (Signed)
  Care Coordination  Outreach Note  10/16/2021 Name: ELONA YINGER MRN: [id number] DOB: [birth year]   Care Coordination Outreach Attempts  A second unsuccessful outreach was attempted today to offer the patient with information about available care coordination services as a benefit of their health plan.     Follow Up Plan:  Additional outreach attempts will be made to offer the patient care coordination information and services.   Encounter Outcome:  No Answer  Julian Hy, [place] Direct Dial: [phone]

## 2021-10-23 NOTE — Chronic Care Management (AMB) (Signed)
  Care Coordination  Outreach Note  10/23/2021 Name: Brooke Weber MRN: [id number] DOB: 03/16/1940   Care Coordination Outreach Attempts  A third unsuccessful outreach was attempted today to offer the patient with information about available care coordination services as a benefit of their health plan.   Follow Up Plan:  No further outreach attempts will be made at this time. We have been unable to contact the patient to offer or enroll patient in care coordination services  Encounter Outcome:  No Answer  Julian Hy, [place] Direct Dial: [phone]

## 2021-10-24 DIAGNOSIS — W19XXXS Unspecified fall, sequela: Secondary | ICD-10-CM | POA: Diagnosis not present

## 2021-10-24 DIAGNOSIS — F411 Generalized anxiety disorder: Secondary | ICD-10-CM | POA: Diagnosis not present

## 2021-10-24 DIAGNOSIS — E871 Hypo-osmolality and hyponatremia: Secondary | ICD-10-CM | POA: Diagnosis not present

## 2021-10-24 DIAGNOSIS — M545 Low back pain, unspecified: Secondary | ICD-10-CM | POA: Diagnosis not present

## 2021-10-24 DIAGNOSIS — F0390 Unspecified dementia without behavioral disturbance: Secondary | ICD-10-CM | POA: Diagnosis not present

## 2021-10-29 DIAGNOSIS — I10 Essential (primary) hypertension: Secondary | ICD-10-CM | POA: Diagnosis not present

## 2021-10-29 DIAGNOSIS — Z79891 Long term (current) use of opiate analgesic: Secondary | ICD-10-CM | POA: Diagnosis not present

## 2021-10-29 DIAGNOSIS — M8580 Other specified disorders of bone density and structure, unspecified site: Secondary | ICD-10-CM | POA: Diagnosis not present

## 2021-10-29 DIAGNOSIS — H269 Unspecified cataract: Secondary | ICD-10-CM | POA: Diagnosis not present

## 2021-10-29 DIAGNOSIS — G8929 Other chronic pain: Secondary | ICD-10-CM | POA: Diagnosis not present

## 2021-10-29 DIAGNOSIS — Z791 Long term (current) use of non-steroidal anti-inflammatories (NSAID): Secondary | ICD-10-CM | POA: Diagnosis not present

## 2021-10-29 DIAGNOSIS — F03A4 Unspecified dementia, mild, with anxiety: Secondary | ICD-10-CM | POA: Diagnosis not present

## 2021-10-29 DIAGNOSIS — K219 Gastro-esophageal reflux disease without esophagitis: Secondary | ICD-10-CM | POA: Diagnosis not present

## 2021-10-29 DIAGNOSIS — Z8781 Personal history of (healed) traumatic fracture: Secondary | ICD-10-CM | POA: Diagnosis not present

## 2021-10-29 DIAGNOSIS — Z9181 History of falling: Secondary | ICD-10-CM | POA: Diagnosis not present

## 2021-10-29 DIAGNOSIS — R296 Repeated falls: Secondary | ICD-10-CM | POA: Diagnosis not present

## 2021-10-29 DIAGNOSIS — E785 Hyperlipidemia, unspecified: Secondary | ICD-10-CM | POA: Diagnosis not present

## 2021-10-29 DIAGNOSIS — R32 Unspecified urinary incontinence: Secondary | ICD-10-CM | POA: Diagnosis not present

## 2021-10-29 DIAGNOSIS — M1612 Unilateral primary osteoarthritis, left hip: Secondary | ICD-10-CM | POA: Diagnosis not present

## 2021-10-30 DIAGNOSIS — G8929 Other chronic pain: Secondary | ICD-10-CM | POA: Diagnosis not present

## 2021-10-30 DIAGNOSIS — M1612 Unilateral primary osteoarthritis, left hip: Secondary | ICD-10-CM | POA: Diagnosis not present

## 2021-10-30 DIAGNOSIS — E785 Hyperlipidemia, unspecified: Secondary | ICD-10-CM | POA: Diagnosis not present

## 2021-10-30 DIAGNOSIS — F03A4 Unspecified dementia, mild, with anxiety: Secondary | ICD-10-CM | POA: Diagnosis not present

## 2021-10-30 DIAGNOSIS — R296 Repeated falls: Secondary | ICD-10-CM | POA: Diagnosis not present

## 2021-10-30 DIAGNOSIS — I10 Essential (primary) hypertension: Secondary | ICD-10-CM | POA: Diagnosis not present

## 2021-11-07 DIAGNOSIS — G8929 Other chronic pain: Secondary | ICD-10-CM | POA: Diagnosis not present

## 2021-11-07 DIAGNOSIS — R296 Repeated falls: Secondary | ICD-10-CM | POA: Diagnosis not present

## 2021-11-07 DIAGNOSIS — I10 Essential (primary) hypertension: Secondary | ICD-10-CM | POA: Diagnosis not present

## 2021-11-07 DIAGNOSIS — E785 Hyperlipidemia, unspecified: Secondary | ICD-10-CM | POA: Diagnosis not present

## 2021-11-07 DIAGNOSIS — M1612 Unilateral primary osteoarthritis, left hip: Secondary | ICD-10-CM | POA: Diagnosis not present

## 2021-11-07 DIAGNOSIS — F03A4 Unspecified dementia, mild, with anxiety: Secondary | ICD-10-CM | POA: Diagnosis not present

## 2021-11-14 ENCOUNTER — Other Ambulatory Visit (HOSPITAL_COMMUNITY): Payer: Self-pay

## 2021-11-16 DIAGNOSIS — Z23 Encounter for immunization: Secondary | ICD-10-CM | POA: Diagnosis not present

## 2021-11-28 DIAGNOSIS — Z6826 Body mass index (BMI) 26.0-26.9, adult: Secondary | ICD-10-CM | POA: Diagnosis not present

## 2021-11-28 DIAGNOSIS — F0390 Unspecified dementia without behavioral disturbance: Secondary | ICD-10-CM | POA: Diagnosis not present

## 2021-11-28 DIAGNOSIS — R110 Nausea: Secondary | ICD-10-CM | POA: Diagnosis not present

## 2021-11-28 DIAGNOSIS — F419 Anxiety disorder, unspecified: Secondary | ICD-10-CM | POA: Diagnosis not present

## 2021-11-28 DIAGNOSIS — I10 Essential (primary) hypertension: Secondary | ICD-10-CM | POA: Diagnosis not present

## 2021-12-14 DIAGNOSIS — Z23 Encounter for immunization: Secondary | ICD-10-CM | POA: Diagnosis not present

## 2021-12-25 DIAGNOSIS — M5136 Other intervertebral disc degeneration, lumbar region: Secondary | ICD-10-CM | POA: Diagnosis not present

## 2021-12-25 DIAGNOSIS — Z79891 Long term (current) use of opiate analgesic: Secondary | ICD-10-CM | POA: Diagnosis not present

## 2021-12-25 DIAGNOSIS — G894 Chronic pain syndrome: Secondary | ICD-10-CM | POA: Diagnosis not present

## 2022-01-03 ENCOUNTER — Other Ambulatory Visit: Payer: Self-pay

## 2022-01-03 ENCOUNTER — Ambulatory Visit
Admission: EM | Admit: 2022-01-03 | Discharge: 2022-01-03 | Disposition: A | Discharge status: Other Acute Inpatient Hospital | Payer: Medicare Other

## 2022-01-03 ENCOUNTER — Encounter: Payer: Self-pay | Admitting: Emergency Medicine

## 2022-01-03 ENCOUNTER — Emergency Department (HOSPITAL_COMMUNITY)
Admission: EM | Admit: 2022-01-03 | Discharge: 2022-01-03 | Discharge status: Against Medical Advice | Payer: Medicare Other | Attending: Emergency Medicine | Admitting: Emergency Medicine

## 2022-01-03 ENCOUNTER — Emergency Department (HOSPITAL_COMMUNITY): Payer: Medicare Other

## 2022-01-03 ENCOUNTER — Encounter (HOSPITAL_COMMUNITY): Payer: Self-pay

## 2022-01-03 DIAGNOSIS — R519 Headache, unspecified: Secondary | ICD-10-CM | POA: Insufficient documentation

## 2022-01-03 DIAGNOSIS — I160 Hypertensive urgency: Secondary | ICD-10-CM

## 2022-01-03 DIAGNOSIS — I10 Essential (primary) hypertension: Secondary | ICD-10-CM | POA: Diagnosis not present

## 2022-01-03 DIAGNOSIS — Z5321 Procedure and treatment not carried out due to patient leaving prior to being seen by health care provider: Secondary | ICD-10-CM | POA: Insufficient documentation

## 2022-01-03 LAB — COMPREHENSIVE METABOLIC PANEL
ALT: 13 U/L (ref 0–44)
AST: 21 U/L (ref 15–41)
Albumin: 3.9 g/dL (ref 3.5–5.0)
Alkaline Phosphatase: 79 U/L (ref 38–126)
Anion gap: 9 (ref 5–15)
BUN: 13 mg/dL (ref 8–23)
CO2: 25 mmol/L (ref 22–32)
Calcium: 9.3 mg/dL (ref 8.9–10.3)
Chloride: 104 mmol/L (ref 98–111)
Creatinine, Ser: 0.59 mg/dL (ref 0.44–1.00)
GFR, Estimated: >60 mL/min (ref >60)
Glucose, Bld: 132 mg/dL — ABNORMAL HIGH (ref 70–99)
Potassium: 3.8 mmol/L (ref 3.5–5.1)
Sodium: 138 mmol/L (ref 135–145)
Total Bilirubin: 0.5 mg/dL (ref 0.3–1.2)
Total Protein: 6.3 g/dL — ABNORMAL LOW (ref 6.5–8.1)

## 2022-01-03 LAB — TROPONIN I (HIGH SENSITIVITY): Troponin I (High Sensitivity): 6 ng/L (ref <18)

## 2022-01-03 NOTE — ED Provider Triage Note (Addendum)
Emergency Medicine Provider Triage Evaluation Note  Brooke Weber , a 80 y.o. female  was evaluated in triage.  Pt complains of headache for the past few days. Patient went to urgent care and was found to be hypertensive.  She was told to come to ER for further evaluation and work-up.  She does take medicine for hypertension and has not had this high of blood pressure before.   Review of Systems  Positive: Headache, hypertension Negative: Chest pain, shortness of breath, visual disturbance, neuro focal deficit  Physical Exam  BP (!) 199/72 (BP Location: Right Arm)   Pulse 62   Temp 97.9 F (36.6 C) (Oral)   Resp 16   SpO2 100%  Gen:   Awake, no distress   Resp:  Normal effort, lungs clear MSK:   Moves extremities without difficulty  Other:  Irregular heart rhythm, regular rate; radial pulse 2+  Medical Decision Making  Medically screening exam initiated at 3:58 PM.  Appropriate orders placed.  ZAILYNN BRANDEL was informed that the remainder of the evaluation will be completed by another provider, this initial triage assessment does not replace that evaluation, and the importance of remaining in the ED until their evaluation is complete.     Theressa Stamps R, [place] 01/03/22 1604    Theressa Stamps [place], [place] 01/03/22 [phone]

## 2022-01-03 NOTE — ED Provider Notes (Signed)
Patient here today for evaluation of headache, elevated blood pressure. She reports symptoms started today. Overall appears stable, no other complaints. Given patient's recent history of hyponatremia with similar presentation recommended further evaluation in the ED. Husband who is here with her today will transport via POV.    Francene Finders, PA-C 01/03/22 1436

## 2022-01-03 NOTE — ED Triage Notes (Signed)
Pt here for HA x 3 days and her husband noticed elevated BP

## 2022-01-03 NOTE — ED Triage Notes (Signed)
Reports headache x 3 days and HTN.  Denies weakness, blurrred vision or speech deficit.

## 2022-01-03 NOTE — ED Notes (Signed)
Patient told registration they can't drive at night so they were leaving.

## 2022-01-03 NOTE — ED Notes (Signed)
Patient is being discharged from the Urgent Care and sent to the Emergency Department via POV . Per RM, patient is in need of higher level of care due to elevated BP and HA. Patient is aware and verbalizes understanding of plan of care.  Vitals:   01/03/22 1429  BP: (!) 197/93  Pulse: (!) 57  Resp: 18  Temp: 97.8 F (36.6 C)  SpO2: 98%

## 2022-01-07 DIAGNOSIS — I10 Essential (primary) hypertension: Secondary | ICD-10-CM | POA: Diagnosis not present

## 2022-01-16 ENCOUNTER — Telehealth: Payer: Self-pay

## 2022-01-16 NOTE — Telephone Encounter (Signed)
     Patient  visit on 01/03/2022  at [facility] was for Hypertension.  Have you been able to follow up with your primary care physician? No. Patient is feeling much better.  The patient was or was not able to obtain any needed medicine or equipment. No medications prescribed.  Are there diet recommendations that you are having difficulty following? No  Patient expresses understanding of discharge instructions and education provided has no other needs at this time.    [place] Resource Care Guide   [email or web address]  ?? [phone]   [email or web address]: [email or web address]  [email or web address]

## 2022-01-29 DIAGNOSIS — K219 Gastro-esophageal reflux disease without esophagitis: Secondary | ICD-10-CM | POA: Diagnosis not present

## 2022-01-29 DIAGNOSIS — M1990 Unspecified osteoarthritis, unspecified site: Secondary | ICD-10-CM | POA: Diagnosis not present

## 2022-01-29 DIAGNOSIS — E782 Mixed hyperlipidemia: Secondary | ICD-10-CM | POA: Diagnosis not present

## 2022-01-29 DIAGNOSIS — I10 Essential (primary) hypertension: Secondary | ICD-10-CM | POA: Diagnosis not present

## 2022-02-15 DIAGNOSIS — I10 Essential (primary) hypertension: Secondary | ICD-10-CM | POA: Diagnosis not present

## 2022-02-15 DIAGNOSIS — Z6826 Body mass index (BMI) 26.0-26.9, adult: Secondary | ICD-10-CM | POA: Diagnosis not present

## 2022-03-07 DIAGNOSIS — K219 Gastro-esophageal reflux disease without esophagitis: Secondary | ICD-10-CM | POA: Diagnosis not present

## 2022-03-07 DIAGNOSIS — M8580 Other specified disorders of bone density and structure, unspecified site: Secondary | ICD-10-CM | POA: Diagnosis not present

## 2022-03-07 DIAGNOSIS — I10 Essential (primary) hypertension: Secondary | ICD-10-CM | POA: Diagnosis not present

## 2022-03-07 DIAGNOSIS — F0390 Unspecified dementia without behavioral disturbance: Secondary | ICD-10-CM | POA: Diagnosis not present

## 2022-03-07 DIAGNOSIS — G4700 Insomnia, unspecified: Secondary | ICD-10-CM | POA: Diagnosis not present

## 2022-03-07 DIAGNOSIS — M1990 Unspecified osteoarthritis, unspecified site: Secondary | ICD-10-CM | POA: Diagnosis not present

## 2022-03-07 DIAGNOSIS — E782 Mixed hyperlipidemia: Secondary | ICD-10-CM | POA: Diagnosis not present

## 2022-04-25 DIAGNOSIS — M5136 Other intervertebral disc degeneration, lumbar region: Secondary | ICD-10-CM | POA: Diagnosis not present

## 2022-04-25 DIAGNOSIS — G894 Chronic pain syndrome: Secondary | ICD-10-CM | POA: Diagnosis not present

## 2022-04-25 DIAGNOSIS — Z79891 Long term (current) use of opiate analgesic: Secondary | ICD-10-CM | POA: Diagnosis not present

## 2022-04-25 DIAGNOSIS — Z5181 Encounter for therapeutic drug level monitoring: Secondary | ICD-10-CM | POA: Diagnosis not present

## 2022-05-03 DIAGNOSIS — M25511 Pain in right shoulder: Secondary | ICD-10-CM | POA: Diagnosis not present

## 2022-08-15 DIAGNOSIS — R531 Weakness: Secondary | ICD-10-CM | POA: Diagnosis not present

## 2022-08-15 DIAGNOSIS — Z6828 Body mass index (BMI) 28.0-28.9, adult: Secondary | ICD-10-CM | POA: Diagnosis not present

## 2022-08-15 DIAGNOSIS — R7303 Prediabetes: Secondary | ICD-10-CM | POA: Diagnosis not present

## 2022-08-15 DIAGNOSIS — M549 Dorsalgia, unspecified: Secondary | ICD-10-CM | POA: Diagnosis not present

## 2022-08-15 DIAGNOSIS — R351 Nocturia: Secondary | ICD-10-CM | POA: Diagnosis not present

## 2022-08-15 DIAGNOSIS — G479 Sleep disorder, unspecified: Secondary | ICD-10-CM | POA: Diagnosis not present

## 2022-08-15 DIAGNOSIS — I10 Essential (primary) hypertension: Secondary | ICD-10-CM | POA: Diagnosis not present

## 2022-08-15 DIAGNOSIS — E782 Mixed hyperlipidemia: Secondary | ICD-10-CM | POA: Diagnosis not present

## 2022-08-15 DIAGNOSIS — G3184 Mild cognitive impairment, so stated: Secondary | ICD-10-CM | POA: Diagnosis not present

## 2022-09-19 DIAGNOSIS — Z23 Encounter for immunization: Secondary | ICD-10-CM | POA: Diagnosis not present

## 2022-09-21 DIAGNOSIS — M25511 Pain in right shoulder: Secondary | ICD-10-CM | POA: Diagnosis not present

## 2022-09-21 DIAGNOSIS — M25561 Pain in right knee: Secondary | ICD-10-CM | POA: Diagnosis not present

## 2022-09-21 DIAGNOSIS — M5136 Other intervertebral disc degeneration, lumbar region: Secondary | ICD-10-CM | POA: Diagnosis not present

## 2022-09-21 DIAGNOSIS — G894 Chronic pain syndrome: Secondary | ICD-10-CM | POA: Diagnosis not present

## 2022-09-21 DIAGNOSIS — Z79891 Long term (current) use of opiate analgesic: Secondary | ICD-10-CM | POA: Diagnosis not present

## 2022-09-25 DIAGNOSIS — M19011 Primary osteoarthritis, right shoulder: Secondary | ICD-10-CM | POA: Diagnosis not present

## 2022-10-14 ENCOUNTER — Emergency Department (HOSPITAL_COMMUNITY)
Admission: EM | Admit: 2022-10-14 | Discharge: 2022-10-14 | Disposition: A | Discharge status: Home / Self Care | Payer: Medicare Other | Source: Home / Self Care | Attending: Emergency Medicine | Admitting: Emergency Medicine

## 2022-10-14 ENCOUNTER — Emergency Department (HOSPITAL_COMMUNITY): Payer: Medicare Other

## 2022-10-14 ENCOUNTER — Other Ambulatory Visit: Payer: Self-pay

## 2022-10-14 ENCOUNTER — Encounter (HOSPITAL_COMMUNITY): Payer: Self-pay

## 2022-10-14 DIAGNOSIS — W1830XA Fall on same level, unspecified, initial encounter: Secondary | ICD-10-CM | POA: Diagnosis present

## 2022-10-14 DIAGNOSIS — Z79899 Other long term (current) drug therapy: Secondary | ICD-10-CM | POA: Insufficient documentation

## 2022-10-14 DIAGNOSIS — M25462 Effusion, left knee: Secondary | ICD-10-CM | POA: Diagnosis not present

## 2022-10-14 DIAGNOSIS — T148XXA Other injury of unspecified body region, initial encounter: Secondary | ICD-10-CM | POA: Diagnosis not present

## 2022-10-14 DIAGNOSIS — I10 Essential (primary) hypertension: Secondary | ICD-10-CM | POA: Insufficient documentation

## 2022-10-14 DIAGNOSIS — K5900 Constipation, unspecified: Secondary | ICD-10-CM | POA: Diagnosis not present

## 2022-10-14 DIAGNOSIS — S0990XA Unspecified injury of head, initial encounter: Secondary | ICD-10-CM | POA: Diagnosis not present

## 2022-10-14 DIAGNOSIS — S52132D Displaced fracture of neck of left radius, subsequent encounter for closed fracture with routine healing: Secondary | ICD-10-CM | POA: Diagnosis not present

## 2022-10-14 DIAGNOSIS — I6782 Cerebral ischemia: Secondary | ICD-10-CM | POA: Diagnosis not present

## 2022-10-14 DIAGNOSIS — N3000 Acute cystitis without hematuria: Secondary | ICD-10-CM | POA: Diagnosis not present

## 2022-10-14 DIAGNOSIS — E871 Hypo-osmolality and hyponatremia: Secondary | ICD-10-CM | POA: Diagnosis not present

## 2022-10-14 DIAGNOSIS — R55 Syncope and collapse: Secondary | ICD-10-CM | POA: Diagnosis not present

## 2022-10-14 DIAGNOSIS — M25562 Pain in left knee: Secondary | ICD-10-CM | POA: Diagnosis not present

## 2022-10-14 DIAGNOSIS — S82032A Displaced transverse fracture of left patella, initial encounter for closed fracture: Secondary | ICD-10-CM | POA: Diagnosis not present

## 2022-10-14 DIAGNOSIS — Y92009 Unspecified place in unspecified non-institutional (private) residence as the place of occurrence of the external cause: Secondary | ICD-10-CM | POA: Diagnosis not present

## 2022-10-14 DIAGNOSIS — W19XXXD Unspecified fall, subsequent encounter: Secondary | ICD-10-CM | POA: Diagnosis not present

## 2022-10-14 DIAGNOSIS — R000 Tachycardia, unspecified: Secondary | ICD-10-CM | POA: Diagnosis not present

## 2022-10-14 DIAGNOSIS — M1990 Unspecified osteoarthritis, unspecified site: Secondary | ICD-10-CM | POA: Diagnosis present

## 2022-10-14 DIAGNOSIS — Z886 Allergy status to analgesic agent status: Secondary | ICD-10-CM | POA: Diagnosis not present

## 2022-10-14 DIAGNOSIS — S52135A Nondisplaced fracture of neck of left radius, initial encounter for closed fracture: Secondary | ICD-10-CM | POA: Diagnosis not present

## 2022-10-14 DIAGNOSIS — F0390 Unspecified dementia without behavioral disturbance: Secondary | ICD-10-CM | POA: Diagnosis not present

## 2022-10-14 DIAGNOSIS — S52135D Nondisplaced fracture of neck of left radius, subsequent encounter for closed fracture with routine healing: Secondary | ICD-10-CM | POA: Diagnosis not present

## 2022-10-14 DIAGNOSIS — S0083XA Contusion of other part of head, initial encounter: Secondary | ICD-10-CM | POA: Insufficient documentation

## 2022-10-14 DIAGNOSIS — K219 Gastro-esophageal reflux disease without esophagitis: Secondary | ICD-10-CM | POA: Diagnosis present

## 2022-10-14 DIAGNOSIS — E876 Hypokalemia: Secondary | ICD-10-CM | POA: Diagnosis not present

## 2022-10-14 DIAGNOSIS — W19XXXA Unspecified fall, initial encounter: Secondary | ICD-10-CM

## 2022-10-14 DIAGNOSIS — S82132D Displaced fracture of medial condyle of left tibia, subsequent encounter for closed fracture with routine healing: Secondary | ICD-10-CM | POA: Diagnosis not present

## 2022-10-14 DIAGNOSIS — S82132A Displaced fracture of medial condyle of left tibia, initial encounter for closed fracture: Secondary | ICD-10-CM | POA: Diagnosis not present

## 2022-10-14 DIAGNOSIS — Z79891 Long term (current) use of opiate analgesic: Secondary | ICD-10-CM | POA: Diagnosis not present

## 2022-10-14 DIAGNOSIS — G894 Chronic pain syndrome: Secondary | ICD-10-CM | POA: Diagnosis not present

## 2022-10-14 DIAGNOSIS — Y92019 Unspecified place in single-family (private) house as the place of occurrence of the external cause: Secondary | ICD-10-CM | POA: Insufficient documentation

## 2022-10-14 DIAGNOSIS — Z981 Arthrodesis status: Secondary | ICD-10-CM | POA: Diagnosis not present

## 2022-10-14 DIAGNOSIS — S82142A Displaced bicondylar fracture of left tibia, initial encounter for closed fracture: Secondary | ICD-10-CM | POA: Diagnosis present

## 2022-10-14 DIAGNOSIS — S0003XA Contusion of scalp, initial encounter: Secondary | ICD-10-CM | POA: Diagnosis not present

## 2022-10-14 DIAGNOSIS — Z90710 Acquired absence of both cervix and uterus: Secondary | ICD-10-CM | POA: Diagnosis not present

## 2022-10-14 DIAGNOSIS — Z888 Allergy status to other drugs, medicaments and biological substances status: Secondary | ICD-10-CM | POA: Diagnosis not present

## 2022-10-14 DIAGNOSIS — S80919A Unspecified superficial injury of unspecified knee, initial encounter: Secondary | ICD-10-CM | POA: Diagnosis not present

## 2022-10-14 DIAGNOSIS — M85862 Other specified disorders of bone density and structure, left lower leg: Secondary | ICD-10-CM | POA: Diagnosis not present

## 2022-10-14 DIAGNOSIS — Z880 Allergy status to penicillin: Secondary | ICD-10-CM | POA: Diagnosis not present

## 2022-10-14 DIAGNOSIS — R110 Nausea: Secondary | ICD-10-CM | POA: Diagnosis not present

## 2022-10-14 DIAGNOSIS — H04123 Dry eye syndrome of bilateral lacrimal glands: Secondary | ICD-10-CM | POA: Diagnosis not present

## 2022-10-14 DIAGNOSIS — J9859 Other diseases of mediastinum, not elsewhere classified: Secondary | ICD-10-CM | POA: Diagnosis not present

## 2022-10-14 DIAGNOSIS — M8580 Other specified disorders of bone density and structure, unspecified site: Secondary | ICD-10-CM | POA: Diagnosis not present

## 2022-10-14 DIAGNOSIS — F419 Anxiety disorder, unspecified: Secondary | ICD-10-CM | POA: Diagnosis present

## 2022-10-14 DIAGNOSIS — E785 Hyperlipidemia, unspecified: Secondary | ICD-10-CM | POA: Diagnosis present

## 2022-10-14 DIAGNOSIS — I959 Hypotension, unspecified: Secondary | ICD-10-CM | POA: Diagnosis not present

## 2022-10-14 DIAGNOSIS — Z91014 Allergy to mammalian meats: Secondary | ICD-10-CM | POA: Diagnosis not present

## 2022-10-14 DIAGNOSIS — Z9049 Acquired absence of other specified parts of digestive tract: Secondary | ICD-10-CM | POA: Diagnosis not present

## 2022-10-14 DIAGNOSIS — Y9301 Activity, walking, marching and hiking: Secondary | ICD-10-CM | POA: Diagnosis present

## 2022-10-14 LAB — URINALYSIS, ROUTINE W REFLEX MICROSCOPIC
Bilirubin Urine: NEGATIVE
Glucose, UA: NEGATIVE mg/dL
Hgb urine dipstick: NEGATIVE
Ketones, ur: NEGATIVE mg/dL
Nitrite: NEGATIVE
Protein, ur: NEGATIVE mg/dL
Specific Gravity, Urine: 1.008 (ref 1.005–1.030)
pH: 7 (ref 5.0–8.0)

## 2022-10-14 LAB — CBC WITH DIFFERENTIAL/PLATELET
Abs Immature Granulocytes: 0.05 10*3/uL (ref 0.00–0.07)
Basophils Absolute: 0.1 10*3/uL (ref 0.0–0.1)
Basophils Relative: 1 %
Eosinophils Absolute: 0.1 10*3/uL (ref 0.0–0.5)
Eosinophils Relative: 0 %
HCT: 39.1 % (ref 36.0–46.0)
Hemoglobin: 13.2 g/dL (ref 12.0–15.0)
Immature Granulocytes: 0 %
Lymphocytes Relative: 7 %
Lymphs Abs: 0.8 10*3/uL (ref 0.7–4.0)
MCH: 31.7 pg (ref 26.0–34.0)
MCHC: 33.8 g/dL (ref 30.0–36.0)
MCV: 93.8 fL (ref 80.0–100.0)
Monocytes Absolute: 0.8 10*3/uL (ref 0.1–1.0)
Monocytes Relative: 7 %
Neutro Abs: 9.9 10*3/uL — ABNORMAL HIGH (ref 1.7–7.7)
Neutrophils Relative %: 85 %
Platelets: 232 10*3/uL (ref 150–400)
RBC: 4.17 MIL/uL (ref 3.87–5.11)
RDW: 12.6 % (ref 11.5–15.5)
WBC: 11.6 10*3/uL — ABNORMAL HIGH (ref 4.0–10.5)
nRBC: 0 % (ref 0.0–0.2)

## 2022-10-14 LAB — BASIC METABOLIC PANEL
Anion gap: 15 (ref 5–15)
BUN: 11 mg/dL (ref 8–23)
CO2: 20 mmol/L — ABNORMAL LOW (ref 22–32)
Calcium: 8.9 mg/dL (ref 8.9–10.3)
Chloride: 95 mmol/L — ABNORMAL LOW (ref 98–111)
Creatinine, Ser: 0.67 mg/dL (ref 0.44–1.00)
GFR, Estimated: >60 mL/min (ref >60)
Glucose, Bld: 99 mg/dL (ref 70–99)
Potassium: 3.4 mmol/L — ABNORMAL LOW (ref 3.5–5.1)
Sodium: 130 mmol/L — ABNORMAL LOW (ref 135–145)

## 2022-10-14 LAB — CBG MONITORING, ED: Glucose-Capillary: 113 mg/dL — ABNORMAL HIGH (ref 70–99)

## 2022-10-14 MED ORDER — CEPHALEXIN 250 MG PO CAPS
250.0000 mg | ORAL_CAPSULE | Freq: Once | ORAL | Status: AC
  Administered 2022-10-14: 250 mg via ORAL
  Filled 2022-10-14: qty 1

## 2022-10-14 MED ORDER — CEPHALEXIN 250 MG PO CAPS: 250.0000 mg | ORAL_CAPSULE | Freq: Four times a day (QID) | ORAL | 0 refills | Status: DC

## 2022-10-14 MED ORDER — ACETAMINOPHEN 500 MG PO TABS
1000.0000 mg | ORAL_TABLET | Freq: Once | ORAL | Status: AC
  Administered 2022-10-14: 1000 mg via ORAL
  Filled 2022-10-14: qty 2

## 2022-10-14 NOTE — ED Provider Notes (Signed)
[place] EMERGENCY DEPARTMENT AT [facility] Provider Note   CSN: [id number] Arrival date & time: 10/14/22  1541     History  Chief Complaint  Patient presents with   Brooke Weber    Brooke Weber is a 82 y.o. female with PMH as listed below who presents with fall at home in spite of husband.  She was walking through a door frame and then fell onto the ground, striking her left forehead.  She did not lose consciousness when she fell.  She is unsure exactly what made her fall.  Her husband states that sometimes she does get dizzy and falls which is not abnormal for her but it is also possible that she tripped over the door frame.  Patient is not sure.  She denies any pain except her left forehead, left elbow, left knee that started after the fall.  Otherwise has been in her normal state of health.  No recent illnesses, fever/chills, chest pain, shortness of breath, nausea vomiting diarrhea constipation, urinary symptoms, leg swelling.  No recent changes to her medications.  Does not take any blood thinners. Has been ambulatory since the event.   EMS vitals: BP 150/68 HR 62 Sp 98 room air RR 15 CBG 125  Past Medical History:  Diagnosis Date   Arthritis    Fall    Hypertension        Home Medications Prior to Admission medications   Medication Sig Start Date End Date Taking? Authorizing Provider  cephALEXin (KEFLEX) 250 MG capsule Take 1 capsule (250 mg total) by mouth 4 (four) times daily for 5 days. 10/14/22 10/19/22 Yes Loetta Rough, MD  acetaminophen (TYLENOL) 325 MG tablet Take 650 mg by mouth every 6 (six) hours as needed for mild pain.    [provider]  ALPRAZolam Prudy Feeler) 0.5 MG tablet Take 0.25 mg by mouth daily as needed for anxiety.     [provider]  atenolol (TENORMIN) 100 MG tablet Take 100 mg by mouth at bedtime. 01/15/14   [provider]  atorvastatin (LIPITOR) 20 MG tablet Take 20 mg by mouth at bedtime. 07/29/21    [provider]  lisinopril (ZESTRIL) 20 MG tablet Take 1 tablet (20 mg total) by mouth daily. 09/29/21 10/29/21  Jerald Kief, MD  omeprazole (PRILOSEC) 20 MG capsule Take 20 mg by mouth daily.    [provider]  oxyCODONE-acetaminophen (PERCOCET) 10-325 MG per tablet Take 1 tablet by mouth every 6 (six) hours as needed for pain.  02/18/14   [provider]  Polyethyl Glycol-Propyl Glycol (SYSTANE) 0.4-0.3 % SOLN Place 2 drops into both eyes 3 (three) times daily.    [provider]  polyethylene glycol (MIRALAX / GLYCOLAX) packet Take 17 g by mouth daily.    [provider]  PROCTOSOL HC 2.5 % rectal cream Place 1 application rectally daily as needed for anal itching.  12/10/16   [provider]      Allergies    Aspirin, Bee venom, Iodine, Other, and Penicillins    Review of Systems   Review of Systems A 10 point review of systems was performed and is negative unless otherwise reported in HPI.  Physical Exam Updated Vital Signs BP (!) 146/89   Pulse 70   Temp 98.6 F (37 C) (Oral)   Resp 16   SpO2 100%  Physical Exam General: Normal appearing female, lying in bed.  HEENT: PERRLA, EOMI, Sclera anicteric, MMM, trachea midline. 2x2 cm  hematoma to left forehead, no skull depressions/deformities. No midline C-spine TTP, deformities/step-offs.  Cardiology: RRR, no murmurs/rubs/gallops.  Resp: Normal respiratory rate and effort. CTAB, no wheezes, rhonchi, crackles.  Abd: Soft, non-tender, non-distended. No rebound tenderness or guarding.  GU: Deferred. MSK: No peripheral edema. Ecchymoses and mild swelling to L knee with ROM decreased d/t pain. No TTP to left tib/fib or femur. Pelvis stable nontender. Left elbow mildly TTP without deformities, swelling, signs of trauma. Intact DP/PT/radial pulses.  Skin: warm, dry.  Neuro: A&Ox4, CNs II-XII grossly intact. 5/5 strength all extremities. Sensation grossly intact.  Psych: Normal mood  and affect.   ED Results / Procedures / Treatments   Labs (all labs ordered are listed, but only abnormal results are displayed) Labs Reviewed  CBC WITH DIFFERENTIAL/PLATELET - Abnormal; Notable for the following components:      Result Value   WBC 11.6 (*)    Neutro Abs 9.9 (*)    All other components within normal limits  BASIC METABOLIC PANEL - Abnormal; Notable for the following components:   Sodium 130 (*)    Potassium 3.4 (*)    Chloride 95 (*)    CO2 20 (*)    All other components within normal limits  URINALYSIS, ROUTINE W REFLEX MICROSCOPIC - Abnormal; Notable for the following components:   APPearance HAZY (*)    Leukocytes,Ua SMALL (*)    Bacteria, UA FEW (*)    Non Squamous Epithelial 0-5 (*)    All other components within normal limits  CBG MONITORING, ED - Abnormal; Notable for the following components:   Glucose-Capillary 113 (*)    All other components within normal limits  URINE CULTURE    EKG None  Radiology DG Knee Complete 4 Views Left  Result Date: 10/14/2022 CLINICAL DATA:  Pain after fall EXAM: LEFT KNEE - COMPLETE 4 VIEW COMPARISON:  None Available. FINDINGS: Transverse minimally displaced fracture of the patella inferiorly. Moderate joint effusion. Adjacent soft tissue swelling in the lateral view. No additional fracture or dislocation. Minimal osteophytes of the medial and lateral compartments. Slight joint space loss of the lateral compartment. Osteopenia. IMPRESSION: Minimally displaced transverse fracture of the inferior aspect of the patella. Joint effusion and soft tissue swelling. Osteopenia Electronically Signed   By: Karen Kays M.D.   On: 10/14/2022 17:58   DG Elbow Complete Left  Result Date: 10/14/2022 CLINICAL DATA:  Pain after fall EXAM: LEFT ELBOW - COMPLETE 4 VIEW COMPARISON:  None Available. FINDINGS: Subtle nondisplaced radial neck fracture which is impacted. No additional fracture or dislocation. Osteopenia. Preserved joint spaces.  IMPRESSION: Subtle impacted radial neck fracture. Electronically Signed   By: Karen Kays M.D.   On: 10/14/2022 17:56   CT Head Wo Contrast  Result Date: 10/14/2022 CLINICAL DATA:  Head trauma, minor (Age >= 65y) EXAM: CT HEAD WITHOUT CONTRAST TECHNIQUE: Contiguous axial images were obtained from the base of the skull through the vertex without intravenous contrast. RADIATION DOSE REDUCTION: This exam was performed according to the departmental dose-optimization program which includes automated exposure control, adjustment of the mA and/or kV according to patient size and/or use of iterative reconstruction technique. COMPARISON:  CT Head 09/25/21 FINDINGS: Brain: No evidence of acute infarction, hemorrhage, hydrocephalus, extra-axial collection or mass lesion/mass effect. Sequela of moderate chronic microvascular ischemic change. Generalized volume loss. Vascular: No hyperdense vessel or unexpected calcification. Skull: Soft tissue hematoma along the left frontal scalp. No evidence of an underlying calvarial fracture. Sinuses/Orbits: No middle ear or mastoid effusion. Paranasal  sinuses are clear. Orbits are unremarkable. Bilateral lens replacements. Other: None. IMPRESSION: 1. No acute intracranial abnormality. 2. Soft tissue hematoma along the left frontal scalp. No evidence of an underlying calvarial fracture. Electronically Signed   By: Lorenza Cambridge M.D.   On: 10/14/2022 16:43    Procedures Procedures    Medications Ordered in ED Medications  cephALEXin (KEFLEX) capsule 250 mg (250 mg Oral Given 10/14/22 1917)  acetaminophen (TYLENOL) tablet 1,000 mg (1,000 mg Oral Given 10/14/22 1917)    ED Course/ Medical Decision Making/ A&P                          Medical Decision Making Amount and/or Complexity of Data Reviewed Labs: ordered. Decision-making details documented in ED Course. Radiology: ordered. Decision-making details documented in ED Course.  Risk OTC drugs. Prescription drug  management.    This patient presents to the ED for concern of fall at home; this involves an extensive number of treatment options, and is a complaint that carries with it a high risk of complications and morbidity.  I considered the following differential and admission for this acute, potentially life threatening condition.   MDM:    DDX for trauma includes but is not limited to:  -Head Injury such as skull fx or ICH - will get CTH d/t L forehead hematoma -Rules out C-spine injury by nexus criteria -Fractures - will get L elbow/L knee Xrays, consider possible soft tissue injury of joints. She has been ambulatory since event, lower c/f tibial plateau fracture.  -Given that patient has history of dizziness/falls, will get basic labs including UA, though also possible she had mechanical fall. She feels at baseline now, no chest pain, SOB. EKG w/o signs of arrhythmia or ischemia, very low c/f ACS.  Clinical Course as of 10/14/22 1933  Mon Oct 14, 2022  1701 CT Head Wo Contrast 1. No acute intracranial abnormality. 2. Soft tissue hematoma along the left frontal scalp. No evidence of an underlying calvarial fracture.   [HN]  1825 Urinalysis, Routine w reflex microscopic -Urine, Clean Catch(!) +UTI with leukocytosis WBC 11.6. Will treat w/ ceftriaxone here.  [HN]  1848 Labs found UTI.  Discussed with patient and her husband, shared decision making, and they would prefer to forego the IV antibiotics here in the ED and be discharged with oral antibiotics. Added on urine culture. Has prior micro data from 2023 sensitive to keflex. [HN]  1848 DG Elbow Complete Left Subtle impacted radial neck fracture. [HN]  1849 DG Knee Complete 4 Views Left Minimally displaced transverse fracture of the inferior aspect of the patella. Joint effusion and soft tissue swelling. Osteopenia   [HN]  1854 Sodium(!): 130 Hyponatremia similar to priors [HN]  1854 Will give weight bearing left knee immobilizer and  left arm sling.  Patient states she is medically chronological appropriately makes her upset her stomach.  She does not have CKD and recommended NSAIDs for a few days for pain control as well as her home oxygen but I warned that her home oxygen could be contributing to her fall and recommended using it sparingly.  Will give Keflex p.o. x 5 days.  Patient is instructed to follow-up with her orthopedic doctor at [facility] and her PCP within 1 week.  Given discharge instructions and return precautions, all questions answered to patient and her husband satisfaction. [HN]    Clinical Course User Index [HN] Loetta Rough, MD    Labs: I Ordered,  and personally interpreted labs.  The pertinent results include:  those listed above  Imaging Studies ordered: I ordered imaging studies including CTH, L elbow, L knee XR I independently visualized and interpreted imaging. I agree with the radiologist interpretation  Additional history obtained from chart review, husband at bedside, external records from [facility]  Reevaluation: After the interventions noted above, I reevaluated the patient and found that they have :improved  Social Determinants of Health: Lives with husband  Disposition:  DC w/ discharge instructions/return precautions. All questions answered to patient's satisfaction.    Co morbidities that complicate the patient evaluation  Past Medical History:  Diagnosis Date   Arthritis    Fall    Hypertension      Medicines Meds ordered this encounter  Medications   cephALEXin (KEFLEX) capsule 250 mg   acetaminophen (TYLENOL) tablet 1,000 mg   cephALEXin (KEFLEX) 250 MG capsule    Sig: Take 1 capsule (250 mg total) by mouth 4 (four) times daily for 5 days.    Dispense:  20 capsule    Refill:  0    I have reviewed the patients home medicines and have made adjustments as needed  Problem List / ED Course: Problem List Items Addressed This Visit   None Visit Diagnoses      Fall in home, initial encounter    -  Primary   Acute cystitis without hematuria       Closed nondisplaced fracture of neck of left radius, initial encounter       Closed displaced transverse fracture of left patella, initial encounter                       This note was created using dictation software, which may contain spelling or grammatical errors.    Loetta Rough, MD 10/14/22 [phone]

## 2022-10-14 NOTE — Discharge Instructions (Addendum)
Thank you for coming to [facility] Emergency [facility]. You were seen for fall with L elbow/knee pain. We did an exam, labs, and imaging, and these showed: -Urinary tract infection - we will treat with keflex 250 mg four times per day for 5 days -Left radial neck fracture - please wear the sling and keep your elbow bent  -Left kneecap fracture - please wear the knee immobilizer (brace) and keep your leg straight. You can walk on the leg with the brace on. Please use your cane at home to help prevent another fall.   Please follow up with your orthopedic doctor at [facility] [address] within 1 week. Please call them in the AM to make an appointment.   Do not hesitate to return to the ED or call 911 if you experience: -Worsening symptoms -Numbness/tingling -Another fall -Lightheadedness, passing out -Fevers/chills -Anything else that concerns you

## 2022-10-14 NOTE — ED Triage Notes (Signed)
EMS reports from: Home. Witnessed fall by husband. No LOC. Not on blood thinners. Pain to the Left Knee, elbow, shoulder. Hematoma on forehead.  BP 150/68 HR 62 Sp 98 room air RR 15 CBG 125

## 2022-10-14 NOTE — Progress Notes (Signed)
Orthopedic Tech Progress Note Patient Details:  Brooke Weber 04-Aug-1940 [id number]  Ortho Devices Type of Ortho Device: Knee Immobilizer, Shoulder immobilizer Ortho Device/Splint Interventions: Ordered, Application, Adjustment   Post Interventions Patient Tolerated: Well Instructions Provided: Care of device, Adjustment of device  Sherilyn Banker 10/14/2022, 7:38 PM

## 2022-10-15 ENCOUNTER — Observation Stay (HOSPITAL_COMMUNITY): Payer: Medicare Other

## 2022-10-15 ENCOUNTER — Emergency Department (HOSPITAL_COMMUNITY): Payer: Medicare Other

## 2022-10-15 ENCOUNTER — Encounter (HOSPITAL_COMMUNITY): Payer: Self-pay | Admitting: Internal Medicine

## 2022-10-15 ENCOUNTER — Inpatient Hospital Stay (HOSPITAL_COMMUNITY)
Admission: EM | Admit: 2022-10-15 | Discharge: 2022-10-22 | DRG: 690 | Disposition: A | Discharge status: Skilled Nursing Facility | Payer: Medicare Other | Attending: Family Medicine | Admitting: Family Medicine

## 2022-10-15 DIAGNOSIS — Z9049 Acquired absence of other specified parts of digestive tract: Secondary | ICD-10-CM

## 2022-10-15 DIAGNOSIS — F419 Anxiety disorder, unspecified: Secondary | ICD-10-CM | POA: Diagnosis present

## 2022-10-15 DIAGNOSIS — M1990 Unspecified osteoarthritis, unspecified site: Secondary | ICD-10-CM | POA: Diagnosis present

## 2022-10-15 DIAGNOSIS — Z79891 Long term (current) use of opiate analgesic: Secondary | ICD-10-CM | POA: Diagnosis not present

## 2022-10-15 DIAGNOSIS — G894 Chronic pain syndrome: Secondary | ICD-10-CM | POA: Diagnosis not present

## 2022-10-15 DIAGNOSIS — M8580 Other specified disorders of bone density and structure, unspecified site: Secondary | ICD-10-CM | POA: Diagnosis present

## 2022-10-15 DIAGNOSIS — K219 Gastro-esophageal reflux disease without esophagitis: Secondary | ICD-10-CM | POA: Diagnosis present

## 2022-10-15 DIAGNOSIS — Y9301 Activity, walking, marching and hiking: Secondary | ICD-10-CM | POA: Diagnosis present

## 2022-10-15 DIAGNOSIS — E876 Hypokalemia: Secondary | ICD-10-CM | POA: Diagnosis present

## 2022-10-15 DIAGNOSIS — R001 Bradycardia, unspecified: Secondary | ICD-10-CM | POA: Diagnosis present

## 2022-10-15 DIAGNOSIS — N3000 Acute cystitis without hematuria: Secondary | ICD-10-CM | POA: Diagnosis not present

## 2022-10-15 DIAGNOSIS — Z90710 Acquired absence of both cervix and uterus: Secondary | ICD-10-CM

## 2022-10-15 DIAGNOSIS — I959 Hypotension, unspecified: Secondary | ICD-10-CM | POA: Diagnosis not present

## 2022-10-15 DIAGNOSIS — Z91014 Allergy to mammalian meats: Secondary | ICD-10-CM

## 2022-10-15 DIAGNOSIS — E861 Hypovolemia: Secondary | ICD-10-CM | POA: Diagnosis present

## 2022-10-15 DIAGNOSIS — R110 Nausea: Secondary | ICD-10-CM | POA: Diagnosis not present

## 2022-10-15 DIAGNOSIS — I10 Essential (primary) hypertension: Secondary | ICD-10-CM | POA: Diagnosis not present

## 2022-10-15 DIAGNOSIS — E871 Hypo-osmolality and hyponatremia: Secondary | ICD-10-CM | POA: Diagnosis present

## 2022-10-15 DIAGNOSIS — S52135D Nondisplaced fracture of neck of left radius, subsequent encounter for closed fracture with routine healing: Secondary | ICD-10-CM

## 2022-10-15 DIAGNOSIS — R55 Syncope and collapse: Secondary | ICD-10-CM | POA: Diagnosis not present

## 2022-10-15 DIAGNOSIS — Z880 Allergy status to penicillin: Secondary | ICD-10-CM

## 2022-10-15 DIAGNOSIS — S82032A Displaced transverse fracture of left patella, initial encounter for closed fracture: Secondary | ICD-10-CM | POA: Diagnosis present

## 2022-10-15 DIAGNOSIS — S82142A Displaced bicondylar fracture of left tibia, initial encounter for closed fracture: Secondary | ICD-10-CM | POA: Diagnosis present

## 2022-10-15 DIAGNOSIS — Y92009 Unspecified place in unspecified non-institutional (private) residence as the place of occurrence of the external cause: Secondary | ICD-10-CM | POA: Diagnosis not present

## 2022-10-15 DIAGNOSIS — W1830XA Fall on same level, unspecified, initial encounter: Secondary | ICD-10-CM | POA: Diagnosis present

## 2022-10-15 DIAGNOSIS — B9620 Unspecified Escherichia coli [E. coli] as the cause of diseases classified elsewhere: Secondary | ICD-10-CM | POA: Diagnosis present

## 2022-10-15 DIAGNOSIS — N390 Urinary tract infection, site not specified: Secondary | ICD-10-CM | POA: Diagnosis present

## 2022-10-15 DIAGNOSIS — Z981 Arthrodesis status: Secondary | ICD-10-CM

## 2022-10-15 DIAGNOSIS — Z886 Allergy status to analgesic agent status: Secondary | ICD-10-CM

## 2022-10-15 DIAGNOSIS — Z803 Family history of malignant neoplasm of breast: Secondary | ICD-10-CM

## 2022-10-15 DIAGNOSIS — Z888 Allergy status to other drugs, medicaments and biological substances status: Secondary | ICD-10-CM

## 2022-10-15 DIAGNOSIS — W19XXXA Unspecified fall, initial encounter: Secondary | ICD-10-CM | POA: Diagnosis not present

## 2022-10-15 DIAGNOSIS — S52132A Displaced fracture of neck of left radius, initial encounter for closed fracture: Secondary | ICD-10-CM | POA: Diagnosis present

## 2022-10-15 DIAGNOSIS — Z79899 Other long term (current) drug therapy: Secondary | ICD-10-CM

## 2022-10-15 DIAGNOSIS — S0083XA Contusion of other part of head, initial encounter: Secondary | ICD-10-CM | POA: Diagnosis present

## 2022-10-15 DIAGNOSIS — S52135A Nondisplaced fracture of neck of left radius, initial encounter for closed fracture: Secondary | ICD-10-CM | POA: Diagnosis present

## 2022-10-15 DIAGNOSIS — R000 Tachycardia, unspecified: Secondary | ICD-10-CM | POA: Diagnosis not present

## 2022-10-15 DIAGNOSIS — E785 Hyperlipidemia, unspecified: Secondary | ICD-10-CM | POA: Diagnosis present

## 2022-10-15 DIAGNOSIS — R680 Hypothermia, not associated with low environmental temperature: Secondary | ICD-10-CM | POA: Diagnosis present

## 2022-10-15 DIAGNOSIS — S82132A Displaced fracture of medial condyle of left tibia, initial encounter for closed fracture: Secondary | ICD-10-CM | POA: Diagnosis present

## 2022-10-15 DIAGNOSIS — J9859 Other diseases of mediastinum, not elsewhere classified: Secondary | ICD-10-CM | POA: Diagnosis not present

## 2022-10-15 LAB — BASIC METABOLIC PANEL
Anion gap: 12 (ref 5–15)
BUN: 10 mg/dL (ref 8–23)
CO2: 24 mmol/L (ref 22–32)
Calcium: 8.1 mg/dL — ABNORMAL LOW (ref 8.9–10.3)
Chloride: 93 mmol/L — ABNORMAL LOW (ref 98–111)
Creatinine, Ser: 0.79 mg/dL (ref 0.44–1.00)
GFR, Estimated: >60 mL/min (ref >60)
Glucose, Bld: 146 mg/dL — ABNORMAL HIGH (ref 70–99)
Potassium: 3.2 mmol/L — ABNORMAL LOW (ref 3.5–5.1)
Sodium: 129 mmol/L — ABNORMAL LOW (ref 135–145)

## 2022-10-15 LAB — ECHOCARDIOGRAM COMPLETE
AR max vel: 1.63 cm2
AV Area VTI: 2.06 cm2
AV Area mean vel: 1.53 cm2
AV Mean grad: 7 mmHg
AV Peak grad: 13.7 mmHg
Ao pk vel: 1.85 m/s
Area-P 1/2: 4.08 cm2
Height: 65 in
P 1/2 time: 380 msec
S' Lateral: 2.3 cm
Weight: 2398.6 oz

## 2022-10-15 LAB — CBC WITH DIFFERENTIAL/PLATELET
Abs Immature Granulocytes: 0.04 10*3/uL (ref 0.00–0.07)
Basophils Absolute: 0 10*3/uL (ref 0.0–0.1)
Basophils Relative: 0 %
Eosinophils Absolute: 0.1 10*3/uL (ref 0.0–0.5)
Eosinophils Relative: 1 %
HCT: 35.9 % — ABNORMAL LOW (ref 36.0–46.0)
Hemoglobin: 12.2 g/dL (ref 12.0–15.0)
Immature Granulocytes: 0 %
Lymphocytes Relative: 6 %
Lymphs Abs: 0.6 10*3/uL — ABNORMAL LOW (ref 0.7–4.0)
MCH: 31.3 pg (ref 26.0–34.0)
MCHC: 34 g/dL (ref 30.0–36.0)
MCV: 92.1 fL (ref 80.0–100.0)
Monocytes Absolute: 0.7 10*3/uL (ref 0.1–1.0)
Monocytes Relative: 7 %
Neutro Abs: 9 10*3/uL — ABNORMAL HIGH (ref 1.7–7.7)
Neutrophils Relative %: 86 %
Platelets: 213 10*3/uL (ref 150–400)
RBC: 3.9 MIL/uL (ref 3.87–5.11)
RDW: 12.7 % (ref 11.5–15.5)
WBC: 10.4 10*3/uL (ref 4.0–10.5)
nRBC: 0 % (ref 0.0–0.2)

## 2022-10-15 LAB — URINALYSIS, ROUTINE W REFLEX MICROSCOPIC
Bilirubin Urine: NEGATIVE
Glucose, UA: NEGATIVE mg/dL
Hgb urine dipstick: NEGATIVE
Ketones, ur: NEGATIVE mg/dL
Nitrite: NEGATIVE
Protein, ur: NEGATIVE mg/dL
Specific Gravity, Urine: 1.011 (ref 1.005–1.030)
pH: 7 (ref 5.0–8.0)

## 2022-10-15 LAB — D-DIMER, QUANTITATIVE: D-Dimer, Quant: 3.3 ug/mL-FEU — ABNORMAL HIGH (ref 0.00–0.50)

## 2022-10-15 LAB — CBG MONITORING, ED: Glucose-Capillary: 147 mg/dL — ABNORMAL HIGH (ref 70–99)

## 2022-10-15 MED ORDER — TRAZODONE HCL 50 MG PO TABS
25.0000 mg | ORAL_TABLET | Freq: Every evening | ORAL | Status: DC | PRN
  Administered 2022-10-16: 50 mg via ORAL
  Administered 2022-10-19: 25 mg via ORAL
  Administered 2022-10-19 – 2022-10-21 (×3): 50 mg via ORAL
  Filled 2022-10-15 (×5): qty 1

## 2022-10-15 MED ORDER — POLYETHYL GLYCOL-PROPYL GLYCOL 0.4-0.3 % OP SOLN: 2.0000 [drp] | Freq: Three times a day (TID) | OPHTHALMIC | Status: DC

## 2022-10-15 MED ORDER — ALBUTEROL SULFATE (2.5 MG/3ML) 0.083% IN NEBU: 2.5000 mg | INHALATION_SOLUTION | Freq: Four times a day (QID) | RESPIRATORY_TRACT | Status: DC | PRN

## 2022-10-15 MED ORDER — OXYCODONE HCL 5 MG PO TABS
5.0000 mg | ORAL_TABLET | Freq: Four times a day (QID) | ORAL | Status: DC | PRN
  Administered 2022-10-16 – 2022-10-21 (×9): 5 mg via ORAL
  Filled 2022-10-15 (×10): qty 1

## 2022-10-15 MED ORDER — SODIUM CHLORIDE 0.9 % IV SOLN
1.0000 g | INTRAVENOUS | Status: DC
  Administered 2022-10-15 – 2022-10-17 (×3): 1 g via INTRAVENOUS
  Filled 2022-10-15 (×3): qty 10

## 2022-10-15 MED ORDER — LACTATED RINGERS IV SOLN: INTRAVENOUS | Status: DC

## 2022-10-15 MED ORDER — OXYCODONE-ACETAMINOPHEN 5-325 MG PO TABS
1.0000 | ORAL_TABLET | Freq: Four times a day (QID) | ORAL | Status: DC | PRN
  Administered 2022-10-17 – 2022-10-21 (×5): 1 via ORAL
  Filled 2022-10-15 (×6): qty 1

## 2022-10-15 MED ORDER — ACETAMINOPHEN 325 MG PO TABS
650.0000 mg | ORAL_TABLET | Freq: Four times a day (QID) | ORAL | Status: DC | PRN
  Administered 2022-10-20: 650 mg via ORAL
  Filled 2022-10-15 (×3): qty 2

## 2022-10-15 MED ORDER — POLYVINYL ALCOHOL 1.4 % OP SOLN
2.0000 [drp] | Freq: Three times a day (TID) | OPHTHALMIC | Status: DC
  Administered 2022-10-15 – 2022-10-22 (×17): 2 [drp] via OPHTHALMIC
  Filled 2022-10-15 (×4): qty 15

## 2022-10-15 MED ORDER — ATORVASTATIN CALCIUM 10 MG PO TABS
20.0000 mg | ORAL_TABLET | Freq: Every day | ORAL | Status: DC
  Administered 2022-10-15 – 2022-10-21 (×7): 20 mg via ORAL
  Filled 2022-10-15 (×7): qty 2

## 2022-10-15 MED ORDER — OXYCODONE-ACETAMINOPHEN 10-325 MG PO TABS: 1.0000 | ORAL_TABLET | Freq: Four times a day (QID) | ORAL | Status: DC | PRN

## 2022-10-15 MED ORDER — ENOXAPARIN SODIUM 40 MG/0.4ML IJ SOSY
40.0000 mg | PREFILLED_SYRINGE | INTRAMUSCULAR | Status: DC
  Administered 2022-10-15 – 2022-10-22 (×8): 40 mg via SUBCUTANEOUS
  Filled 2022-10-15 (×8): qty 0.4

## 2022-10-15 MED ORDER — ACETAMINOPHEN 650 MG RE SUPP: 650.0000 mg | Freq: Four times a day (QID) | RECTAL | Status: DC | PRN

## 2022-10-15 MED ORDER — ALPRAZOLAM 0.25 MG PO TABS
0.2500 mg | ORAL_TABLET | Freq: Every day | ORAL | Status: DC | PRN
  Administered 2022-10-16 – 2022-10-20 (×4): 0.25 mg via ORAL
  Filled 2022-10-15 (×5): qty 1

## 2022-10-15 MED ORDER — POTASSIUM CHLORIDE CRYS ER 20 MEQ PO TBCR
40.0000 meq | EXTENDED_RELEASE_TABLET | Freq: Once | ORAL | Status: AC
  Administered 2022-10-15: 40 meq via ORAL
  Filled 2022-10-15: qty 2

## 2022-10-15 MED ORDER — SODIUM CHLORIDE 0.9% FLUSH
3.0000 mL | Freq: Two times a day (BID) | INTRAVENOUS | Status: DC
  Administered 2022-10-15 – 2022-10-22 (×15): 3 mL via INTRAVENOUS

## 2022-10-15 MED ORDER — PANTOPRAZOLE SODIUM 40 MG PO TBEC
40.0000 mg | DELAYED_RELEASE_TABLET | Freq: Every day | ORAL | Status: DC
  Administered 2022-10-15 – 2022-10-22 (×8): 40 mg via ORAL
  Filled 2022-10-15 (×8): qty 1

## 2022-10-15 NOTE — ED Triage Notes (Signed)
Pt BIB EMS for syncopal episodes while sitting in recliner chair. Report initial pt BP 70/40 and then went up to 150s systolic. Pt was seen yesterday for fall and has fractured radius and patella. Hx of dementia. No blood thinners. Pt A&OX4.   Given with EMS:   500 cc fluid en route 4 mg Zofran

## 2022-10-15 NOTE — ED Notes (Signed)
Taken for chest Xray at this time.

## 2022-10-15 NOTE — ED Provider Notes (Signed)
Care of patient handed off to me by Antony Madura, PA-C at change of shift.  Briefly, 82 year old female who presents to the emergency department with loss of consciousness.  Patient was apparently in her recliner when she had a syncopal episode.  No associated symptoms.  Husband was there at time of syncope.  When EMS got there systolic was 70.  This improved to 150s.  She was also here yesterday with mechanical fall and sustained a left radial head and left tibial plateau fracture.  She was also discharged with Keflex for presumed cystitis.  She has a history of hypertension, chronic hyponatremia, chronic pain syndrome Physical Exam  BP (!) 153/65 (BP Location: Right Arm)   Pulse 62   Temp (!) 97.4 F (36.3 C) (Oral)   Resp 16   Ht 5\' 5"  (1.651 m)   Wt 68 kg   SpO2 100%   BMI 24.95 kg/m   Physical Exam Vitals and nursing note reviewed.  HENT:     Head: Normocephalic and atraumatic.  Eyes:     General: No scleral icterus. Pulmonary:     Effort: Pulmonary effort is normal. No respiratory distress.  Skin:    Findings: No rash.  Neurological:     General: No focal deficit present.     Mental Status: She is alert.  Psychiatric:        Mood and Affect: Mood normal.        Behavior: Behavior normal.        Thought Content: Thought content normal.        Judgment: Judgment normal.    Procedures  Procedures  ED Course / MDM   Clinical Course as of 10/15/22 0711  Tue Oct 15, 2022  6433 Hyponatremia noted. This is chronic and stable for the patient. [KH]  2951 Oral potassium ordered for K of 3.2 [KH]    Clinical Course User Index [KH] Antony Madura, PA-C   Medical Decision Making Amount and/or Complexity of Data Reviewed Labs: ordered. Radiology: ordered.  Risk Prescription drug management. Decision regarding hospitalization.   Here labs show known chronic hyponatremia.  Potassium is 3.2, being orally replaced.  Glucose is normal.  No leukocytosis or any anemia. EKG and  is nonischemic and sinus rhythm.  Chest x-ray is normal.  UA is pending, but had 1 performed yesterday after fall.  Plan is to admit patient for high risk syncope.  [id number]: Consulted and spoke with Dr. Katrinka Blazing, hospitalist, who agrees to admit patient. Requests d-dimer add on.        Cristopher Peru, PA-C 10/15/22 [id number]    Maia Plan, MD 10/15/22 [phone]

## 2022-10-15 NOTE — ED Provider Notes (Signed)
[place] [facility] DEPARTMENT AT [facility] Provider Note   CSN: [id number] Arrival date & time: 10/15/22  0451     History  Chief Complaint  Patient presents with   Loss of Consciousness    Brooke Weber is a 82 y.o. female.  82 y/o female with hx of hypertension, chronic pain syndrome, reflux presents to the emergency department for reported syncopal episode.  EMS state that patient had an initial blood pressure of 70/40 which improved to 150 systolic.  She was allegedly sitting in her recliner chair at the time of her syncopal event.  She does not recall feeling lightheaded.  Denies associated chest pain, shortness of breath.  There is a history of mild cognitive impairment at baseline.  EMS administered Zofran and 500 cc IV fluid en route to the ED.  The patient is not on chronic anticoagulants.  She was seen in the ED yesterday for mechanical fall.  Sustained fractures of the left knee and elbow.  Discharged on Keflex course for presumed cystitis.  The history is provided by the patient and the EMS personnel. No language interpreter was used.  Loss of Consciousness      Home Medications Prior to Admission medications   Medication Sig Start Date End Date Taking? Authorizing Provider  acetaminophen (TYLENOL) 325 MG tablet Take 650 mg by mouth every 6 (six) hours as needed for mild pain.    [provider]  ALPRAZolam Prudy Feeler) 0.5 MG tablet Take 0.25 mg by mouth daily as needed for anxiety.     [provider]  atenolol (TENORMIN) 100 MG tablet Take 100 mg by mouth at bedtime. 01/15/14   [provider]  atorvastatin (LIPITOR) 20 MG tablet Take 20 mg by mouth at bedtime. 07/29/21   [provider]  cephALEXin (KEFLEX) 250 MG capsule Take 1 capsule (250 mg total) by mouth 4 (four) times daily for 5 days. 10/14/22 10/19/22  Loetta Rough, MD  lisinopril (ZESTRIL) 20 MG tablet Take 1 tablet (20 mg total) by mouth daily. 09/29/21  10/29/21  Jerald Kief, MD  omeprazole (PRILOSEC) 20 MG capsule Take 20 mg by mouth daily.    [provider]  oxyCODONE-acetaminophen (PERCOCET) 10-325 MG per tablet Take 1 tablet by mouth every 6 (six) hours as needed for pain.  02/18/14   [provider]  Polyethyl Glycol-Propyl Glycol (SYSTANE) 0.4-0.3 % SOLN Place 2 drops into both eyes 3 (three) times daily.    [provider]  polyethylene glycol (MIRALAX / GLYCOLAX) packet Take 17 g by mouth daily.    [provider]  PROCTOSOL HC 2.5 % rectal cream Place 1 application rectally daily as needed for anal itching.  12/10/16   [provider]      Allergies    Aspirin, Bee venom, Iodine, Other, and Penicillins    Review of Systems   Review of Systems  Unable to perform ROS: Dementia  Cardiovascular:  Positive for syncope.    Physical Exam Updated Vital Signs BP (!) 153/65 (BP Location: Right Arm)   Pulse 62   Temp (!) 97.4 F (36.3 C) (Oral)   Resp 16   Ht 5\' 5"  (1.651 m)   Wt 68 kg   SpO2 100%   BMI 24.95 kg/m   Physical Exam Vitals and nursing note reviewed.  Constitutional:      General: She is not in acute distress.    Appearance: She is well-developed. She is not diaphoretic.  Comments: Old and frail appearing.  HENT:     Head: Normocephalic and atraumatic.     Comments: Minimal contusion lateral to the L eye Eyes:     General: No scleral icterus.    Extraocular Movements: Extraocular movements intact and EOM normal.     Conjunctiva/sclera: Conjunctivae normal.  Cardiovascular:     Rate and Rhythm: Normal rate and regular rhythm.     Pulses: Normal pulses.  Pulmonary:     Effort: Pulmonary effort is normal. No respiratory distress.     Breath sounds: No stridor. No wheezing.     Comments: Lungs CTAB. Respirations even and unlabored. Musculoskeletal:     Cervical back: Normal range of motion.     Comments: Bruising of the L knee.  Skin:    General: Skin is  warm and dry.     Coloration: Skin is not pale.     Findings: No erythema or rash.  Neurological:     Mental Status: She is alert and oriented to person, place, and time.     Coordination: Coordination normal.  Psychiatric:        Mood and Affect: Mood and affect normal.        Behavior: Behavior normal.     ED Results / Procedures / Treatments   Labs (all labs ordered are listed, but only abnormal results are displayed) Labs Reviewed  BASIC METABOLIC PANEL - Abnormal; Notable for the following components:      Result Value   Sodium 129 (*)    Potassium 3.2 (*)    Chloride 93 (*)    Glucose, Bld 146 (*)    Calcium 8.1 (*)    All other components within normal limits  CBC WITH DIFFERENTIAL/PLATELET - Abnormal; Notable for the following components:   HCT 35.9 (*)    Neutro Abs 9.0 (*)    Lymphs Abs 0.6 (*)    All other components within normal limits  CBG MONITORING, ED - Abnormal; Notable for the following components:   Glucose-Capillary 147 (*)    All other components within normal limits  URINALYSIS, ROUTINE W REFLEX MICROSCOPIC    EKG EKG Interpretation Date/Time:  Tuesday October 15 2022 05:13:34 EDT Ventricular Rate:  62 PR Interval:  191 QRS Duration:  106 QT Interval:  457 QTC Calculation: 465 R Axis:   12  Text Interpretation: Sinus rhythm Confirmed by Gilda Crease [phone]) on 10/15/2022 5:23:56 AM  Radiology DG Chest 2 View  Result Date: 10/15/2022 CLINICAL DATA:  Syncope.  Loss of consciousness EXAM: CHEST - 2 VIEW COMPARISON:  01/03/2022 FINDINGS: Normal heart size. Lower mediastinal widening with gas suggesting hiatal hernia. There is no edema, consolidation, effusion, or pneumothorax. Artifact from EKG leads. IMPRESSION: No active cardiopulmonary disease. Electronically Signed   By: Tiburcio Pea M.D.   On: 10/15/2022 06:12   DG Knee Complete 4 Views Left  Result Date: 10/14/2022 CLINICAL DATA:  Pain after fall EXAM: LEFT KNEE - COMPLETE 4  VIEW COMPARISON:  None Available. FINDINGS: Transverse minimally displaced fracture of the patella inferiorly. Moderate joint effusion. Adjacent soft tissue swelling in the lateral view. No additional fracture or dislocation. Minimal osteophytes of the medial and lateral compartments. Slight joint space loss of the lateral compartment. Osteopenia. IMPRESSION: Minimally displaced transverse fracture of the inferior aspect of the patella. Joint effusion and soft tissue swelling. Osteopenia Electronically Signed   By: Karen Kays M.D.   On: 10/14/2022 17:58   DG Elbow Complete Left  Result  Date: 10/14/2022 CLINICAL DATA:  Pain after fall EXAM: LEFT ELBOW - COMPLETE 4 VIEW COMPARISON:  None Available. FINDINGS: Subtle nondisplaced radial neck fracture which is impacted. No additional fracture or dislocation. Osteopenia. Preserved joint spaces. IMPRESSION: Subtle impacted radial neck fracture. Electronically Signed   By: Karen Kays M.D.   On: 10/14/2022 17:56   CT Head Wo Contrast  Result Date: 10/14/2022 CLINICAL DATA:  Head trauma, minor (Age >= 65y) EXAM: CT HEAD WITHOUT CONTRAST TECHNIQUE: Contiguous axial images were obtained from the base of the skull through the vertex without intravenous contrast. RADIATION DOSE REDUCTION: This exam was performed according to the departmental dose-optimization program which includes automated exposure control, adjustment of the mA and/or kV according to patient size and/or use of iterative reconstruction technique. COMPARISON:  CT Head 09/25/21 FINDINGS: Brain: No evidence of acute infarction, hemorrhage, hydrocephalus, extra-axial collection or mass lesion/mass effect. Sequela of moderate chronic microvascular ischemic change. Generalized volume loss. Vascular: No hyperdense vessel or unexpected calcification. Skull: Soft tissue hematoma along the left frontal scalp. No evidence of an underlying calvarial fracture. Sinuses/Orbits: No middle ear or mastoid effusion.  Paranasal sinuses are clear. Orbits are unremarkable. Bilateral lens replacements. Other: None. IMPRESSION: 1. No acute intracranial abnormality. 2. Soft tissue hematoma along the left frontal scalp. No evidence of an underlying calvarial fracture. Electronically Signed   By: Lorenza Cambridge M.D.   On: 10/14/2022 16:43    Procedures Procedures    Medications Ordered in ED Medications  lactated ringers infusion ( Intravenous New Bag/Given 10/15/22 0656)  potassium chloride SA (KLOR-CON M) CR tablet 40 mEq (40 mEq Oral Given 10/15/22 0656)    ED Course/ Medical Decision Making/ A&P Clinical Course as of 10/15/22 1610  Tue Oct 15, 2022  9604 Hyponatremia noted. This is chronic and stable for the patient. [KH]  5409 Oral potassium ordered for K of 3.2 [KH]    Clinical Course User Index [KH] Antony Madura, PA-C                                 Medical Decision Making Amount and/or Complexity of Data Reviewed Labs: ordered. Radiology: ordered.  Risk Prescription drug management. Decision regarding hospitalization.   This patient presents to the ED for concern of syncope, this involves an extensive number of treatment options, and is a complaint that carries with it a high risk of complications and morbidity.  The differential diagnosis includes arrhythmia vs medication reaction vs sepsis vs dehydration   Co morbidities that complicate the patient evaluation  HTN Chronic pain syndrome Reflux   Additional history obtained:  Additional history obtained from EMS personnel External records from outside source obtained and reviewed including UA from ED visit yesterday notable for mild pyuria   Lab Tests:  I Ordered, and personally interpreted labs.  The pertinent results include:  NA 129 (chronic, stable), K 3.2, Cl 93   Imaging Studies ordered:  I ordered imaging studies including CXR  I independently visualized and interpreted imaging which showed no acute cardiopulmonary  abnormality I agree with the radiologist interpretation   Cardiac Monitoring:  The patient was maintained on a cardiac monitor.  I personally viewed and interpreted the cardiac monitored which showed an underlying rhythm of: NSR   Medicines ordered and prescription drug management:  I have reviewed the patients home medicines and have made adjustments as needed   Test Considered:  Orthostatic vital signs - however, limited  due to tibial plateau fx   Problem List / ED Course:  As above   Reevaluation:  After the interventions noted above, I reevaluated the patient and found that they have : remained stable   Social Determinants of Health:  Ambulatory with assist device   Dispostion:  Care signed out to [place], [place].         Final Clinical Impression(s) / ED Diagnoses Final diagnoses:  Syncope, unspecified syncope type    Rx / DC Orders ED Discharge Orders     None         Antony Madura, PA-C 10/30/22 1800    Gilda Crease, MD 11/06/22 [phone]

## 2022-10-15 NOTE — ED Notes (Signed)
Back from imaging.

## 2022-10-15 NOTE — ED Notes (Signed)
Receiving nurse to call me back.

## 2022-10-15 NOTE — ED Notes (Signed)
Patient left the floor in stable condition with her belongings and staff.  

## 2022-10-15 NOTE — ED Notes (Signed)
Pt was stuck and unsuccessful.

## 2022-10-15 NOTE — H&P (Signed)
History and Physical    Patient: Brooke Weber:[id number] DOB: January 23, 1941 DOA: 10/15/2022 DOS: the patient was seen and examined on 10/15/2022 PCP: Daisy Floro, MD  Patient coming from: Home via EMS  Chief Complaint:  Chief Complaint  Patient presents with   Loss of Consciousness   HPI: EVOLETH DUBEAU is a 82 y.o. female with medical history significant of hypertension, arthritis, anxiety, and GERD who presents for syncope.  Patient had just recently been seen in the emergency department at [facility] yesterday after having a fall while walking through the doorway hitting the left side of her face on the ground.  It was reported the she did not lose consciousness at that time.  Unclear the cause for the fall, but patient did report that she gets dizzy.  Patient reported pain in her forehead, left elbow, and left knee related to the fall.  Labs were significant for WBC 11.6, sodium 130, and potassium 3.4.  X-ray imaging noted this little impacted radial neck fracture of the left minimally displaced transverse fracture of the inferior aspect of the patella with joint effusion.  Urinalysis noted concern for possible urinary tract infection and patient was given Rocephin IV.  She is discharged with prescription for Keflex to complete course for presumed cystitis.  After getting home patient was reported to have been up and down throughout the night having to urinate.  He had not gotten to pick up the prescription for Keflex yet.  Husband reports that she passed out while sitting down for which she called EMS.  He was so anxious he is not clear how long she was out.  On EMS arrival patient had a second syncopal episode and was noted to be hypotensive with blood pressures reported to be 71/50 while sitting in her rolling wheelchair with seat.   In the emergency department patient was noted to be afebrile, pulse 55-73, blood pressures elevated up to 163/78, and all other signs  maintained.  Labs significant for sodium 129, potassium 3.2, glucose 146, and calcium 8.1.  Chest x-ray showed no acute cardiopulmonary disease.  D-dimer was ordered.  Patient was given  Review of Systems: As mentioned in the history of present illness. All other systems reviewed and are negative. Past Medical History:  Diagnosis Date   Arthritis    Fall    Hypertension    Past Surgical History:  Procedure Laterality Date   ABDOMINAL HYSTERECTOMY  1978   Partial   BACK SURGERY  2003   cervial fusion four discs   CHOLECYSTECTOMY  2008   JOINT REPLACEMENT Right 1996   OPEN REDUCTION INTERNAL FIXATION (ORIF) PROXIMAL PHALANX Right 02/18/2017   Procedure: CLOSED REDUCTION PINNING RIGHT INDEX PROXIMAL PHALANX FRACTURE;  Surgeon: Dominica Severin, MD;  Location: [facility] OR;  Service: Orthopedics;  Laterality: Right;  60 MINS   SHOULDER SURGERY  2008   Pinning   Social History:  reports that she has never smoked. She has never used smokeless tobacco. She reports that she does not drink alcohol and does not use drugs.  Allergies  Allergen Reactions   Aspirin Anaphylaxis   Bee Venom Anaphylaxis   Iodine Anaphylaxis   Other Anaphylaxis    nuts   Penicillins Anaphylaxis    Has patient had a PCN reaction causing immediate rash, facial/tongue/throat swelling, SOB or lightheadedness with hypotension: No Has patient had a PCN reaction causing severe rash involving mucus membranes or skin necrosis: No Has patient had a PCN reaction that  required hospitalization: No Has patient had a PCN reaction occurring within the last 10 years: No If all of the above answers are "NO", then may proceed with Cephalosporin use.    Family History  Problem Relation Age of Onset   Breast cancer Sister 30   Breast cancer Maternal Aunt 60    Prior to Admission medications   Medication Sig Start Date End Date Taking? Authorizing Provider  acetaminophen (TYLENOL) 325 MG tablet Take 650 mg by mouth every 6 (six)  hours as needed for mild pain.    [provider]  ALPRAZolam Prudy Feeler) 0.5 MG tablet Take 0.25 mg by mouth daily as needed for anxiety.     [provider]  atenolol (TENORMIN) 100 MG tablet Take 100 mg by mouth at bedtime. 01/15/14   [provider]  atorvastatin (LIPITOR) 20 MG tablet Take 20 mg by mouth at bedtime. 07/29/21   [provider]  cephALEXin (KEFLEX) 250 MG capsule Take 1 capsule (250 mg total) by mouth 4 (four) times daily for 5 days. 10/14/22 10/19/22  Loetta Rough, MD  lisinopril (ZESTRIL) 20 MG tablet Take 1 tablet (20 mg total) by mouth daily. 09/29/21 10/29/21  Jerald Kief, MD  omeprazole (PRILOSEC) 20 MG capsule Take 20 mg by mouth daily.    [provider]  oxyCODONE-acetaminophen (PERCOCET) 10-325 MG per tablet Take 1 tablet by mouth every 6 (six) hours as needed for pain.  02/18/14   [provider]  Polyethyl Glycol-Propyl Glycol (SYSTANE) 0.4-0.3 % SOLN Place 2 drops into both eyes 3 (three) times daily.    [provider]  polyethylene glycol (MIRALAX / GLYCOLAX) packet Take 17 g by mouth daily.    [provider]  PROCTOSOL HC 2.5 % rectal cream Place 1 application rectally daily as needed for anal itching.  12/10/16   [provider]    Physical Exam: Vitals:   10/15/22 0458 10/15/22 0503 10/15/22 0830 10/15/22 0843  BP: (!) 153/65   136/64  Pulse: 62  73   Resp: 16  20   Temp: (!) 97.4 F (36.3 C)     TempSrc: Oral     SpO2: 100%  100%   Weight:  68 kg    Height:  5\' 5"  (1.651 m)      Constitutional: Elderly female currently in no acute distress. Eyes: PERRL, lids and conjunctivae normal.  Bruising around the left eye. ENMT: Mucous membranes are moist. Posterior pharynx clear of any exudate or lesions.Normal dentition.  Neck: normal, supple  Respiratory: clear to auscultation bilaterally, no wheezing, no crackles. Normal respiratory effort. No accessory muscle use.   Cardiovascular: Regular rate and rhythm, no murmurs / rubs / gallops. No extremity edema. 2+ pedal pulses. No carotid bruits.  Abdomen: no tenderness, no masses palpated. No hepatosplenomegaly. Bowel sounds positive.  Musculoskeletal: no clubbing / cyanosis.   Decreased range of motion of left leg.    Skin: Bruising noted of the left knee Neurologic: CN 2-12 grossly intact.  Able to move all extremities.   Psychiatric: Normal judgment and insight. Alert and oriented x person and place.  Data Reviewed:  EKG reveals sinus rhythm at 62 bpm without acute ischemic changes.  Reviewed labs, imaging, and pertinent records as noted above in HPI.  Assessment and Plan:  Syncope Transient hypotension  Patient was noted to be hypotensive with blood pressures noted to drop as low as 71/50 with EMS during one of the syncopal episode.  Chest x-ray  did not note any acute abnormality. -Admit to a telemetry bed -Check orthostatic vital signs -Hold home blood pressure medicines of lisinopril and atenolol -Check D-dimer(3.3) check CT angiogram of the chest -Check echocardiogram -Lactated Ringer's at 75 mL/h overnight -Follow-up telemetry  Left tibial plateau fracture and left radial neck fracture secondary to fall Unclear if fall yesterday was related to a syncopal episode.   -PT to eval and treat -Recommended to follow-up with orthopedics in the outpatient setting -Transitions of care consulted as husband unable to care for her himself at this time.  Suspected urinary tract infection Prior to arrival.  Patient had been seen in the emergency department yesterday where urinalysis had concern for UTI.  Patient had been given empiric antibiotics of Rocephin and sent home with prescription for cephalexin. -Check urine culture -Continue Rocephin IV  Hypokalemia Acute.  Initial potassium 3.2.   -Give 40 meq potassium chloride p.o. -Continue to monitor and replace as needed  Hyponatremia Chronic.   Sodium 129 which appears similar to prior.    -Continue to monitor  Chronic pain on long-term opiate pain medication like -Continue oxycodone as needed for pain.  Hyperlipidemia -Continue atorvastatin  DVT prophylaxis: Lovenox Advance Care Planning:   Code Status: Full Code     Consults: None  Family Communication: Husband updated over the phone  Severity of Illness: The appropriate patient status for this patient is OBSERVATION. Observation status is judged to be reasonable and necessary in order to provide the required intensity of service to ensure the patient's safety. The patient's presenting symptoms, physical exam findings, and initial radiographic and laboratory data in the context of their medical condition is felt to place them at decreased risk for further clinical deterioration. Furthermore, it is anticipated that the patient will be medically stable for discharge from the hospital within 2 midnights of admission.   Author: Clydie Braun, MD 10/15/2022 8:45 AM  For on call review [email or web address].

## 2022-10-15 NOTE — ED Notes (Signed)
Placed patient on bedpan at this time and pt trying to provide sample

## 2022-10-15 NOTE — ED Notes (Signed)
ED TO INPATIENT HANDOFF REPORT  ED Nurse Name and Phone #: Renelda Loma Name/Age/Gender Brooke Weber 82 y.o. female Room/Bed: 035C/035C  Code Status   Code Status: Prior  Home/SNF/Other Nursing Home Patient oriented to: self Is this baseline? Yes   Triage Complete: Triage complete  Chief Complaint Syncope [R55]  Triage Note Pt BIB EMS for syncopal episodes while sitting in recliner chair. Report initial pt BP 70/40 and then went up to 150s systolic. Pt was seen yesterday for fall and has fractured radius and patella. Hx of dementia. No blood thinners. Pt A&OX4.   Given with EMS:   500 cc fluid en route 4 mg Zofran   Allergies Allergies  Allergen Reactions   Aspirin Anaphylaxis   Bee Venom Anaphylaxis   Iodine Anaphylaxis   Other Anaphylaxis    nuts   Penicillins Anaphylaxis    Has patient had a PCN reaction causing immediate rash, facial/tongue/throat swelling, SOB or lightheadedness with hypotension: No Has patient had a PCN reaction causing severe rash involving mucus membranes or skin necrosis: No Has patient had a PCN reaction that required hospitalization: No Has patient had a PCN reaction occurring within the last 10 years: No If all of the above answers are "NO", then may proceed with Cephalosporin use.    Level of Care/Admitting Diagnosis ED Disposition     ED Disposition  Admit   Condition  --   Comment  Hospital Area: [facility] [[id number]]  Level of Care: Telemetry Medical [104]  May place patient in observation at [facility] or [facility] if equivalent level of care is available:: No  Covid Evaluation: Asymptomatic - no recent exposure (last 10 days) testing not required  Diagnosis: Syncope [206001]  Admitting Physician: Clydie Braun [[id number]]  Attending Physician: Clydie Braun [[id number]]          B Medical/Surgery History Past Medical History:  Diagnosis Date   Arthritis    Fall    Hypertension     Past Surgical History:  Procedure Laterality Date   ABDOMINAL HYSTERECTOMY  1978   Partial   BACK SURGERY  2003   cervial fusion four discs   CHOLECYSTECTOMY  2008   JOINT REPLACEMENT Right 1996   OPEN REDUCTION INTERNAL FIXATION (ORIF) PROXIMAL PHALANX Right 02/18/2017   Procedure: CLOSED REDUCTION PINNING RIGHT INDEX PROXIMAL PHALANX FRACTURE;  Surgeon: Dominica Severin, MD;  Location: [facility] OR;  Service: Orthopedics;  Laterality: Right;  60 MINS   SHOULDER SURGERY  2008   Pinning     A IV Location/Drains/Wounds Patient Lines/Drains/Airways Status     Active Line/Drains/Airways     Name Placement date Placement time Site Days   Peripheral IV 10/15/22 22 G Right;Posterior Hand 10/15/22  0506  Hand  less than 1   Peripheral IV 10/15/22 20 G Anterior;Proximal;Right;Upper Arm 10/15/22  0808  Arm  less than 1            Intake/Output Last 24 hours No intake or output data in the 24 hours ending 10/15/22 0848  Labs/Imaging Results for orders placed or performed during the hospital encounter of 10/15/22 (from the past 48 hour(s))  Basic metabolic panel     Status: Abnormal   Collection Time: 10/15/22  5:38 AM  Result Value Ref Range   Sodium 129 (L) 135 - 145 mmol/L   Potassium 3.2 (L) 3.5 - 5.1 mmol/L   Chloride 93 (L) 98 - 111 mmol/L   CO2 24 22 -  32 mmol/L   Glucose, Bld 146 (H) 70 - 99 mg/dL    Comment: Glucose reference range applies only to samples taken after fasting for at least 8 hours.   BUN 10 8 - 23 mg/dL   Creatinine, Ser 1.61 0.44 - 1.00 mg/dL   Calcium 8.1 (L) 8.9 - 10.3 mg/dL   GFR, Estimated >09 >60 mL/min    Comment: (NOTE) Calculated using the CKD-EPI Creatinine Equation (2021)    Anion gap 12 5 - 15    Comment: Performed at [facility], [address] [place], [place] [address]  CBC WITH DIFFERENTIAL     Status: Abnormal   Collection Time: 10/15/22  5:38 AM  Result Value Ref Range   WBC 10.4 4.0 - 10.5 K/uL   RBC 3.90 3.87 - 5.11 MIL/uL    Hemoglobin 12.2 12.0 - 15.0 g/dL   HCT 81.1 (L) 91.4 - 78.2 %   MCV 92.1 80.0 - 100.0 fL   MCH 31.3 26.0 - 34.0 pg   MCHC 34.0 30.0 - 36.0 g/dL   RDW 95.6 21.3 - 08.6 %   Platelets 213 150 - 400 K/uL   nRBC 0.0 0.0 - 0.2 %   Neutrophils Relative % 86 %   Neutro Abs 9.0 (H) 1.7 - 7.7 K/uL   Lymphocytes Relative 6 %   Lymphs Abs 0.6 (L) 0.7 - 4.0 K/uL   Monocytes Relative 7 %   Monocytes Absolute 0.7 0.1 - 1.0 K/uL   Eosinophils Relative 1 %   Eosinophils Absolute 0.1 0.0 - 0.5 K/uL   Basophils Relative 0 %   Basophils Absolute 0.0 0.0 - 0.1 K/uL   Immature Granulocytes 0 %   Abs Immature Granulocytes 0.04 0.00 - 0.07 K/uL    Comment: Performed at [facility], [address] [place], [place] [address]  CBG monitoring, ED     Status: Abnormal   Collection Time: 10/15/22  5:40 AM  Result Value Ref Range   Glucose-Capillary 147 (H) 70 - 99 mg/dL    Comment: Glucose reference range applies only to samples taken after fasting for at least 8 hours.  Urinalysis, Routine w reflex microscopic -Urine, Clean Catch     Status: Abnormal   Collection Time: 10/15/22  7:25 AM  Result Value Ref Range   Color, Urine YELLOW YELLOW   APPearance HAZY (A) CLEAR   Specific Gravity, Urine 1.011 1.005 - 1.030   pH 7.0 5.0 - 8.0   Glucose, UA NEGATIVE NEGATIVE mg/dL   Hgb urine dipstick NEGATIVE NEGATIVE   Bilirubin Urine NEGATIVE NEGATIVE   Ketones, ur NEGATIVE NEGATIVE mg/dL   Protein, ur NEGATIVE NEGATIVE mg/dL   Nitrite NEGATIVE NEGATIVE   Leukocytes,Ua SMALL (A) NEGATIVE   RBC / HPF 0-5 0 - 5 RBC/hpf   WBC, UA 21-50 0 - 5 WBC/hpf   Bacteria, UA FEW (A) NONE SEEN   Squamous Epithelial / HPF 0-5 0 - 5 /HPF   Mucus PRESENT    Hyaline Casts, UA PRESENT    Non Squamous Epithelial 0-5 (A) NONE SEEN    Comment: Performed at [facility], [address], [place], [place] [address]   DG Chest 2 View  Result Date: 10/15/2022 CLINICAL DATA:  Syncope.  Loss of consciousness EXAM:  CHEST - 2 VIEW COMPARISON:  01/03/2022 FINDINGS: Normal heart size. Lower mediastinal widening with gas suggesting hiatal hernia. There is no edema, consolidation, effusion, or pneumothorax. Artifact from EKG leads. IMPRESSION: No active  cardiopulmonary disease. Electronically Signed   By: Tiburcio Pea M.D.   On: 10/15/2022 06:12   DG Knee Complete 4 Views Left  Result Date: 10/14/2022 CLINICAL DATA:  Pain after fall EXAM: LEFT KNEE - COMPLETE 4 VIEW COMPARISON:  None Available. FINDINGS: Transverse minimally displaced fracture of the patella inferiorly. Moderate joint effusion. Adjacent soft tissue swelling in the lateral view. No additional fracture or dislocation. Minimal osteophytes of the medial and lateral compartments. Slight joint space loss of the lateral compartment. Osteopenia. IMPRESSION: Minimally displaced transverse fracture of the inferior aspect of the patella. Joint effusion and soft tissue swelling. Osteopenia Electronically Signed   By: Karen Kays M.D.   On: 10/14/2022 17:58   DG Elbow Complete Left  Result Date: 10/14/2022 CLINICAL DATA:  Pain after fall EXAM: LEFT ELBOW - COMPLETE 4 VIEW COMPARISON:  None Available. FINDINGS: Subtle nondisplaced radial neck fracture which is impacted. No additional fracture or dislocation. Osteopenia. Preserved joint spaces. IMPRESSION: Subtle impacted radial neck fracture. Electronically Signed   By: Karen Kays M.D.   On: 10/14/2022 17:56   CT Head Wo Contrast  Result Date: 10/14/2022 CLINICAL DATA:  Head trauma, minor (Age >= 65y) EXAM: CT HEAD WITHOUT CONTRAST TECHNIQUE: Contiguous axial images were obtained from the base of the skull through the vertex without intravenous contrast. RADIATION DOSE REDUCTION: This exam was performed according to the departmental dose-optimization program which includes automated exposure control, adjustment of the mA and/or kV according to patient size and/or use of iterative reconstruction technique.  COMPARISON:  CT Head 09/25/21 FINDINGS: Brain: No evidence of acute infarction, hemorrhage, hydrocephalus, extra-axial collection or mass lesion/mass effect. Sequela of moderate chronic microvascular ischemic change. Generalized volume loss. Vascular: No hyperdense vessel or unexpected calcification. Skull: Soft tissue hematoma along the left frontal scalp. No evidence of an underlying calvarial fracture. Sinuses/Orbits: No middle ear or mastoid effusion. Paranasal sinuses are clear. Orbits are unremarkable. Bilateral lens replacements. Other: None. IMPRESSION: 1. No acute intracranial abnormality. 2. Soft tissue hematoma along the left frontal scalp. No evidence of an underlying calvarial fracture. Electronically Signed   By: Lorenza Cambridge M.D.   On: 10/14/2022 16:43    Pending Labs Unresulted Labs (From admission, onward)     Start     Ordered   10/15/22 0840  D-dimer, quantitative  Once,   R        10/15/22 0840   10/15/22 0723  Urine Culture (for pregnant, neutropenic or urologic patients or patients with an indwelling urinary catheter)  (Urine Labs)  Once,   URGENT       Question:  Indication  Answer:  Urgency/frequency   10/15/22 0722            Vitals/Pain Today's Vitals   10/15/22 0458 10/15/22 0503 10/15/22 0830 10/15/22 0843  BP: (!) 153/65   136/64  Pulse: 62  73   Resp: 16  20   Temp: (!) 97.4 F (36.3 C)     TempSrc: Oral     SpO2: 100%  100%   Weight:  68 kg    Height:  5\' 5"  (1.651 m)    PainSc:  0-No pain      Isolation Precautions No active isolations  Medications Medications  lactated ringers infusion ( Intravenous New Bag/Given 10/15/22 0656)  potassium chloride SA (KLOR-CON M) CR tablet 40 mEq (40 mEq Oral Given 10/15/22 0656)    Mobility Unsure of how patient ambulates due to recent falls, syncopal episodes, and does not have  family with her at thistime      Focused Assessments Syncopal episodes    R Recommendations: See Admitting Provider  Note  Report given to:   Additional Notes: pt has confusion at times, has a bruise/abrasion to left eye, Katrinka Blazing, MD in the room at this time

## 2022-10-16 DIAGNOSIS — I10 Essential (primary) hypertension: Secondary | ICD-10-CM | POA: Diagnosis present

## 2022-10-16 DIAGNOSIS — M8580 Other specified disorders of bone density and structure, unspecified site: Secondary | ICD-10-CM | POA: Diagnosis present

## 2022-10-16 DIAGNOSIS — Z91014 Allergy to mammalian meats: Secondary | ICD-10-CM | POA: Diagnosis not present

## 2022-10-16 DIAGNOSIS — F0390 Unspecified dementia without behavioral disturbance: Secondary | ICD-10-CM | POA: Diagnosis not present

## 2022-10-16 DIAGNOSIS — Z79899 Other long term (current) drug therapy: Secondary | ICD-10-CM | POA: Diagnosis not present

## 2022-10-16 DIAGNOSIS — S82142A Displaced bicondylar fracture of left tibia, initial encounter for closed fracture: Secondary | ICD-10-CM | POA: Diagnosis present

## 2022-10-16 DIAGNOSIS — W19XXXD Unspecified fall, subsequent encounter: Secondary | ICD-10-CM | POA: Diagnosis not present

## 2022-10-16 DIAGNOSIS — E871 Hypo-osmolality and hyponatremia: Secondary | ICD-10-CM | POA: Diagnosis present

## 2022-10-16 DIAGNOSIS — W19XXXA Unspecified fall, initial encounter: Secondary | ICD-10-CM | POA: Diagnosis not present

## 2022-10-16 DIAGNOSIS — Z9049 Acquired absence of other specified parts of digestive tract: Secondary | ICD-10-CM | POA: Diagnosis not present

## 2022-10-16 DIAGNOSIS — W1830XA Fall on same level, unspecified, initial encounter: Secondary | ICD-10-CM | POA: Diagnosis present

## 2022-10-16 DIAGNOSIS — Z888 Allergy status to other drugs, medicaments and biological substances status: Secondary | ICD-10-CM | POA: Diagnosis not present

## 2022-10-16 DIAGNOSIS — R55 Syncope and collapse: Secondary | ICD-10-CM | POA: Diagnosis present

## 2022-10-16 DIAGNOSIS — G894 Chronic pain syndrome: Secondary | ICD-10-CM | POA: Diagnosis present

## 2022-10-16 DIAGNOSIS — I959 Hypotension, unspecified: Secondary | ICD-10-CM | POA: Diagnosis not present

## 2022-10-16 DIAGNOSIS — M1990 Unspecified osteoarthritis, unspecified site: Secondary | ICD-10-CM | POA: Diagnosis present

## 2022-10-16 DIAGNOSIS — S0083XA Contusion of other part of head, initial encounter: Secondary | ICD-10-CM | POA: Diagnosis present

## 2022-10-16 DIAGNOSIS — F419 Anxiety disorder, unspecified: Secondary | ICD-10-CM | POA: Diagnosis present

## 2022-10-16 DIAGNOSIS — S52132D Displaced fracture of neck of left radius, subsequent encounter for closed fracture with routine healing: Secondary | ICD-10-CM | POA: Diagnosis not present

## 2022-10-16 DIAGNOSIS — M25562 Pain in left knee: Secondary | ICD-10-CM | POA: Diagnosis not present

## 2022-10-16 DIAGNOSIS — K219 Gastro-esophageal reflux disease without esophagitis: Secondary | ICD-10-CM | POA: Diagnosis present

## 2022-10-16 DIAGNOSIS — E785 Hyperlipidemia, unspecified: Secondary | ICD-10-CM | POA: Diagnosis present

## 2022-10-16 DIAGNOSIS — Z886 Allergy status to analgesic agent status: Secondary | ICD-10-CM | POA: Diagnosis not present

## 2022-10-16 DIAGNOSIS — E876 Hypokalemia: Secondary | ICD-10-CM | POA: Diagnosis present

## 2022-10-16 DIAGNOSIS — H04123 Dry eye syndrome of bilateral lacrimal glands: Secondary | ICD-10-CM | POA: Diagnosis not present

## 2022-10-16 DIAGNOSIS — Z880 Allergy status to penicillin: Secondary | ICD-10-CM | POA: Diagnosis not present

## 2022-10-16 DIAGNOSIS — S52135A Nondisplaced fracture of neck of left radius, initial encounter for closed fracture: Secondary | ICD-10-CM | POA: Diagnosis present

## 2022-10-16 DIAGNOSIS — S82032A Displaced transverse fracture of left patella, initial encounter for closed fracture: Secondary | ICD-10-CM | POA: Diagnosis present

## 2022-10-16 DIAGNOSIS — Z90710 Acquired absence of both cervix and uterus: Secondary | ICD-10-CM | POA: Diagnosis not present

## 2022-10-16 DIAGNOSIS — S82132A Displaced fracture of medial condyle of left tibia, initial encounter for closed fracture: Secondary | ICD-10-CM | POA: Diagnosis not present

## 2022-10-16 DIAGNOSIS — Z79891 Long term (current) use of opiate analgesic: Secondary | ICD-10-CM | POA: Diagnosis not present

## 2022-10-16 DIAGNOSIS — S82132D Displaced fracture of medial condyle of left tibia, subsequent encounter for closed fracture with routine healing: Secondary | ICD-10-CM | POA: Diagnosis not present

## 2022-10-16 DIAGNOSIS — N3000 Acute cystitis without hematuria: Secondary | ICD-10-CM | POA: Diagnosis present

## 2022-10-16 DIAGNOSIS — K5900 Constipation, unspecified: Secondary | ICD-10-CM | POA: Diagnosis not present

## 2022-10-16 DIAGNOSIS — Z981 Arthrodesis status: Secondary | ICD-10-CM | POA: Diagnosis not present

## 2022-10-16 DIAGNOSIS — T148XXA Other injury of unspecified body region, initial encounter: Secondary | ICD-10-CM | POA: Diagnosis not present

## 2022-10-16 DIAGNOSIS — Y9301 Activity, walking, marching and hiking: Secondary | ICD-10-CM | POA: Diagnosis present

## 2022-10-16 DIAGNOSIS — Y92009 Unspecified place in unspecified non-institutional (private) residence as the place of occurrence of the external cause: Secondary | ICD-10-CM | POA: Diagnosis not present

## 2022-10-16 DIAGNOSIS — S52135D Nondisplaced fracture of neck of left radius, subsequent encounter for closed fracture with routine healing: Secondary | ICD-10-CM | POA: Diagnosis not present

## 2022-10-16 MED ORDER — SODIUM CHLORIDE 0.9 % IV SOLN: INTRAVENOUS | Status: AC

## 2022-10-16 MED ORDER — POTASSIUM CHLORIDE CRYS ER 20 MEQ PO TBCR
40.0000 meq | EXTENDED_RELEASE_TABLET | Freq: Two times a day (BID) | ORAL | Status: AC
  Administered 2022-10-16 (×2): 40 meq via ORAL
  Filled 2022-10-16 (×2): qty 2

## 2022-10-16 NOTE — Evaluation (Signed)
Occupational Therapy Evaluation Patient Details Name: Brooke Weber MRN: [id number] DOB: January 30, 1941 Today's Date: 10/16/2022   History of Present Illness 82 y.o. female [facility] to [facility] ED 8/13 via EMS after syncopal episode while sitting in recliner chair. BP in field 70/40. BP in ED 163/78 Seen at [facility] ED 8/12 after syncopal event with fall resulting in blow to left forehead, L fractured radius and L minimally displaced transverse fracture of the inferior aspect of the patella as well as acute cystitis. WJX:[id number], hypertension, arthritis, anxiety, and GERD   Clinical Impression   Prior to this admission, patient living with spouse, walking with a cane or rollator, and requiring increased assist to complete ADLs. Patient no longer drives. Patient was discharged from [facility] ED with L LE KI and WBAT. Pt's husband states he is unable to provide enough assistance when KI is in place and needed to remove KI for pt to urinate. No KI in room on Eval. Per husband pt has been able to walk with minor not debilitating knee pain without KI in place. Patient presenting with decreased activity tolerance and need for increased assist to complete all ADLs and functional mobilty. Patient confused throughout evaluation, however this is likely baseline due to dementia. Patient min A for ADLs and functional mobility, with orthostatics assessed throughout though stable and asymptomatic. OT recommending HHOT at discharge; OT will continue to follow actuely.      If plan is discharge home, recommend the following: A little help with walking and/or transfers;Direct supervision/assist for medications management;Direct supervision/assist for financial management;Assist for transportation;Help with stairs or ramp for entrance;Supervision due to cognitive status;A little help with bathing/dressing/bathroom    Functional Status Assessment  Patient has had a recent decline in their functional status and demonstrates the ability  to make significant improvements in function in a reasonable and predictable amount of time.  Equipment Recommendations  None recommended by OT (patient has DME needed)    Recommendations for Other Services       Precautions / Restrictions Precautions Precautions: Fall Precaution Comments: syncopal falls Required Braces or Orthoses: Knee Immobilizer - Left;Sling Knee Immobilizer - Left: Other (comment) (not stated) Restrictions Weight Bearing Restrictions: Yes LLE Weight Bearing: Weight bearing as tolerated Other Position/Activity Restrictions: with KI in place with last ED visit      Mobility Bed Mobility Overal bed mobility: Needs Assistance Bed Mobility: Supine to Sit     Supine to sit: HOB elevated, Used rails     General bed mobility comments: increased time and effort with use of bed rail to pull to EoB    Transfers Overall transfer level: Needs assistance Equipment used: Rolling walker (2 wheels) Transfers: Sit to/from Stand, Bed to chair/wheelchair/BSC Sit to Stand: Contact guard assist     Step pivot transfers: Contact guard assist     General transfer comment: vc for hand placement for power up, good power up, increased time for self steadying, slightly antalgic weightbearing on L LE with stepping to recliner      Balance Overall balance assessment: Needs assistance Sitting-balance support: Feet supported, No upper extremity supported Sitting balance-[name]: Good     Standing balance support: During functional activity, Bilateral upper extremity supported Standing balance-[name]: Poor                             ADL either performed or assessed with clinical judgement   ADL Overall ADL's : Needs assistance/impaired Eating/Feeding: Set  up;Sitting   Grooming: Set up;Sitting   Upper Body Bathing: Minimal assistance;Sitting   Lower Body Bathing: Moderate assistance;Sitting/lateral leans;Sit to/from stand   Upper Body  Dressing : Minimal assistance;Sitting   Lower Body Dressing: Moderate assistance;Sit to/from stand;Sitting/lateral leans   Toilet Transfer: Minimal assistance;Stand-pivot;Rolling walker (2 wheels)   Toileting- Clothing Manipulation and Hygiene: Minimal assistance;Sit to/from stand;Sitting/lateral lean Toileting - Clothing Manipulation Details (indicate cue type and reason): for thoroughness     Functional mobility during ADLs: Minimal assistance;Cueing for sequencing;Cueing for safety;Rolling walker (2 wheels) General ADL Comments: Patient presenting with decreased activity tolerance and need for increased assist to complete all ADLs and functional mobilty. Patient confused throughout evaluation, however this is likely baseline due to dementia. Patient min A for ADLs and functiona mobility, with orthostatics assessed throughout though stable and asymptomatic. OT recommending HHOT at discharge; OT will continue to follow actuely.     Vision Baseline Vision/History: 0 No visual deficits Ability to See in Adequate Light: 0 Adequate Patient Visual Report: No change from baseline Vision Assessment?: No apparent visual deficits     Perception Perception: Not tested       Praxis Praxis: Not tested       Pertinent Vitals/Pain Pain Assessment Pain Assessment: No/denies pain     Extremity/Trunk Assessment Upper Extremity Assessment Upper Extremity Assessment: Right hand dominant;LUE deficits/detail;Generalized weakness LUE Deficits / Details: radial fracture therefore gentle PROM and AROM completed, no strength assessed for safety LUE Sensation: WNL LUE Coordination: decreased gross motor   Lower Extremity Assessment Lower Extremity Assessment: Defer to PT evaluation   Cervical / Trunk Assessment Cervical / Trunk Assessment: Kyphotic   Communication Communication Communication: No apparent difficulties   Cognition Arousal: Alert Behavior During Therapy: WFL for tasks  assessed/performed Overall Cognitive Status: History of cognitive impairments - at baseline Area of Impairment: Orientation, Safety/judgement, Problem solving, Awareness, Memory, Attention                 Orientation Level: Disoriented to, Place, Time, Situation Current Attention Level: Sustained Memory: Decreased short-term memory   Safety/Judgement: Decreased awareness of deficits   Problem Solving: Requires verbal cues, Requires tactile cues General Comments: pt oriented to self, able to relate home set up with about 75% accuracy, poor recall of sequence of events leading up to hospitalization     General Comments  Pt husband relates he is not able to assist her mobilization with L LE KI and L UE sling in place. Had to remove to facilitate pt getting to toilet, left off after that. Husband reports pt needs to get up multiple times during the night to urinate and she is often dizzy when this happens.    Exercises     Shoulder Instructions      Home Living Family/patient expects to be discharged to:: Private residence Living Arrangements: Spouse/significant other Available Help at Discharge: Family;Available 24 hours/day (but husband runs errands and leaves her alone during the day) Type of Home: House Home Access: Stairs to enter [employer] of Steps: 4 Entrance Stairs-Rails: Left Home Layout: One level     Bathroom Shower/Tub: Producer, television/film/video: Standard Bathroom Accessibility: Yes   Home Equipment: Rollator (4 wheels);Cane - single point;Shower seat - built in          Prior Functioning/Environment Prior Level of Function : Independent/Modified Independent             Mobility Comments: uses cane in community and generally walls and furniture in her  home ADLs Comments: independent with ADLs, husband assists with iADLs        OT Problem List: Decreased strength;Decreased activity tolerance;Decreased range of motion;Impaired  balance (sitting and/or standing);Decreased safety awareness;Decreased cognition;Pain;Impaired UE functional use      OT Treatment/Interventions: Self-care/ADL training;Therapeutic exercise;Energy conservation;DME and/or AE instruction;Manual therapy;Therapeutic activities;Patient/family education;Balance training    OT Goals(Current goals can be found in the care plan section) Acute Rehab OT Goals Patient Stated Goal: to go home OT Goal Formulation: With patient Time For Goal Achievement: 10/30/22 Potential to Achieve Goals: Good  OT Frequency: Min 1X/week    Co-evaluation   Reason for Co-Treatment: Necessary to address cognition/behavior during functional activity;To address functional/ADL transfers PT goals addressed during session: Mobility/safety with mobility;Balance        AM-PAC OT "6 Clicks" Daily Activity     Outcome Measure Help from another person eating meals?: None Help from another person taking care of personal grooming?: None Help from another person toileting, which includes using toliet, bedpan, or urinal?: A Little Help from another person bathing (including washing, rinsing, drying)?: A Little Help from another person to put on and taking off regular upper body clothing?: A Little Help from another person to put on and taking off regular lower body clothing?: A Little 6 Click Score: 20   End of Session Equipment Utilized During Treatment: Gait belt;Rolling walker (2 wheels) Nurse Communication: Mobility status  Activity Tolerance: Patient tolerated treatment well Patient left: in chair;with call bell/phone within reach;with chair alarm set  OT Visit Diagnosis: Unsteadiness on feet (R26.81);Repeated falls (R29.6);Muscle weakness (generalized) (M62.81);History of falling (Z91.81)                Time: 1610-9604 OT Time Calculation (min): 42 min Charges:  OT General Charges $OT Visit: 1 Visit OT Evaluation $OT Eval Moderate Complexity: 1 Mod  Pollyann Glen E.  , OTR/L Acute Rehabilitation Services [phone]   Cherlyn Cushing 10/16/2022, 12:29 PM

## 2022-10-16 NOTE — Progress Notes (Signed)
Patient has been trying to get OOB without assistance.    She is now requesting a female nurse.  This nurse went in to speak with patient.  She is requesting a female nurse to stay in the room all night with her.  I explained that we can change out her nurse to a female nurse however the nurse will not be able to just stay in her room all night.  6962 Patient has been taking off her telemetry leads every time we replace it.  She is not compliant.  CCMD notified.

## 2022-10-16 NOTE — Plan of Care (Signed)

## 2022-10-16 NOTE — Progress Notes (Signed)
[facility] PROGRESS NOTE    Progress Note  Brooke Weber  ZOX:[id number] DOB: October 22, 1940 DOA: 10/15/2022 PCP: Daisy Floro, MD     Brief Narrative:   Brooke Weber is an 82 y.o. female past medical history of essential hypertension anxiety recently seen in the emergency room and was not for fall found to the ground face first, denied any loss of consciousness imaging showed no acute findings UA was possibly concerning for UTI was given antibiotics and discharged home with Keflex.  Since he got home he is relates he has not been able to urinate did not pick up his prescription, EMS was called when she passed out found to be hypotensive on arrival to the ED was found to have an elevated blood pressure sodium 129 potassium 3.2 chest x-ray showed no acute findings.   Assessment/Plan:   Syncope/  Transient hypotension Patient was noted to be hypotensive by EMS 71/50. Question likely due to infectious etiology. Holding all antihypertensive medication. D-dimer was 3.3, CT angio of the chest pending. Echo pending Change IV fluids to normal saline. Twelve-lead EKG showed no acute findings, she needs to be placed on cardiac monitoring she is not showing up in the monitor.  Left medial tibial plateau fracture/  Fracture of radial neck, left, closed Follow-up with orthopedic surgery as an outpatient. PT OT eval is pending.  Suspect urinary tract infection:  she was given a dose of IV Rocephin today prior to admission in the ED. History prescribed Keflex which she has not picked up. Urine culture showed more than 100,000 colonies of  gram-negative rods. Has remained afebrile no leukocytosis continue Rocephin.  Hypokalemia: Try to keep greater than 4 replete orally, potassium this morning still 3.2. Continue IV fluids and potassium orally recheck in the morning.  Possibly hypovolemic hyponatremia: Discontinue lactate started on normal saline's recheck basic metabolic  panel in the morning.  Chronic pain/long-term opiate use: Continue current medication.  Hyperlipidemia Continue statins.    DVT prophylaxis: lovenox Family Communication:none Status is: Observation The patient remains OBS appropriate and will d/c before 2 midnights.    Code Status:     Code Status Orders  (From admission, onward)           Start     Ordered   10/15/22 0930  Full code  Continuous       Question:  By:  Answer:  Consent: discussion documented in EHR   10/15/22 0931           Code Status History     Date Active Date Inactive Code Status Order ID Comments User Context   09/26/2021 0451 09/28/2021 1907 Full Code [id number]  Carollee Herter, DO Inpatient         IV Access:   Peripheral IV   Procedures and diagnostic studies:   ECHOCARDIOGRAM COMPLETE  Result Date: 10/15/2022    ECHOCARDIOGRAM REPORT   Patient Name:   Brooke Weber Date of Exam: 10/15/2022 Medical Rec #:  [id number]         Height:       65.0 in Accession #:    [id number]        Weight:       149.9 lb Date of Birth:  1940/11/16          BSA:          1.750 m Patient Age:    82 years          BP:  145/88 mmHg Patient Gender: F                 HR:           70 bpm. Exam Location:  Inpatient Procedure: 2D Echo, Color Doppler and Cardiac Doppler Indications:    Syncope  History:        Patient has no prior history of Echocardiogram examinations.                 Signs/Symptoms:Syncope; Risk Factors:Hypertension.  Sonographer:    Darlys Gales Referring Phys: Loreta Ave SMITH IMPRESSIONS  1. Left ventricular ejection fraction, by estimation, is 60 to 65%. The left ventricle has normal function. The left ventricle has no regional wall motion abnormalities. Indeterminate diastolic filling due to E-A fusion.  2. Right ventricular systolic function is normal. The right ventricular size is normal.  3. Left atrial size was mildly dilated.  4. The mitral valve is normal in structure. No evidence of  mitral valve regurgitation. No evidence of mitral stenosis.  5. The aortic valve is tricuspid. Aortic valve regurgitation is mild to moderate. Aortic valve sclerosis is present, with no evidence of aortic valve stenosis.  6. The inferior vena cava is normal in size with greater than 50% respiratory variability, suggesting right atrial pressure of 3 mmHg. Comparison(s): No prior Echocardiogram. FINDINGS  Left Ventricle: Left ventricular ejection fraction, by estimation, is 60 to 65%. The left ventricle has normal function. The left ventricle has no regional wall motion abnormalities. The left ventricular internal cavity size was normal in size. There is  no left ventricular hypertrophy. Indeterminate diastolic filling due to E-A fusion. Right Ventricle: The right ventricular size is normal. No increase in right ventricular wall thickness. Right ventricular systolic function is normal. Left Atrium: Left atrial size was mildly dilated. Right Atrium: Right atrial size was normal in size. Pericardium: There is no evidence of pericardial effusion. Mitral Valve: The mitral valve is normal in structure. No evidence of mitral valve regurgitation. No evidence of mitral valve stenosis. Tricuspid Valve: The tricuspid valve is normal in structure. Tricuspid valve regurgitation is not demonstrated. No evidence of tricuspid stenosis. Aortic Valve: The aortic valve is tricuspid. Aortic valve regurgitation is mild to moderate. Aortic regurgitation PHT measures 380 msec. Aortic valve sclerosis is present, with no evidence of aortic valve stenosis. Aortic valve mean gradient measures 7.0  mmHg. Aortic valve peak gradient measures 13.7 mmHg. Aortic valve area, by VTI measures 2.06 cm. Pulmonic Valve: The pulmonic valve was not well visualized. Pulmonic valve regurgitation is mild. No evidence of pulmonic stenosis. Aorta: The aortic root and ascending aorta are structurally normal, with no evidence of dilitation. Venous: The inferior  vena cava is normal in size with greater than 50% respiratory variability, suggesting right atrial pressure of 3 mmHg. IAS/Shunts: No atrial level shunt detected by color flow Doppler.  LEFT VENTRICLE PLAX 2D LVIDd:         4.30 cm   Diastology LVIDs:         2.30 cm   LV e' medial:    6.42 cm/s LV PW:         0.90 cm   LV E/e' medial:  10.0 LV IVS:        0.90 cm   LV e' lateral:   7.40 cm/s LVOT diam:     1.70 cm   LV E/e' lateral: 8.7 LV SV:         67 LV SV Index:  38 LVOT Area:     2.27 cm  RIGHT VENTRICLE RV S prime:     17.20 cm/s TAPSE (M-mode): 2.4 cm LEFT ATRIUM             Index        RIGHT ATRIUM           Index LA Vol (A2C):   53.9 ml 30.80 ml/m  RA Area:     13.80 cm LA Vol (A4C):   72.2 ml 41.26 ml/m  RA Volume:   35.00 ml  20.00 ml/m LA Biplane Vol: 63.2 ml 36.11 ml/m  AORTIC VALVE AV Area (Vmax):    1.63 cm AV Area (Vmean):   1.53 cm AV Area (VTI):     2.06 cm AV Vmax:           185.00 cm/s AV Vmean:          130.000 cm/s AV VTI:            0.324 m AV Peak Grad:      13.7 mmHg AV Mean Grad:      7.0 mmHg LVOT Vmax:         133.00 cm/s LVOT Vmean:        87.800 cm/s LVOT VTI:          0.294 m LVOT/AV VTI ratio: 0.91 AI PHT:            380 msec  AORTA Ao Root diam: 2.80 cm MITRAL VALVE                TRICUSPID VALVE MV Area (PHT): 4.08 cm     TR Peak grad:   31.6 mmHg MV Decel Time: 186 msec     TR Vmax:        281.00 cm/s MV E velocity: 64.50 cm/s MV A velocity: 115.00 cm/s  SHUNTS MV E/A ratio:  0.56         Systemic VTI:  0.29 m                             Systemic Diam: 1.70 cm Riley Lam MD Electronically signed by Riley Lam MD Signature Date/Time: 10/15/2022/4:55:38 PM    Final    DG Chest 2 View  Result Date: 10/15/2022 CLINICAL DATA:  Syncope.  Loss of consciousness EXAM: CHEST - 2 VIEW COMPARISON:  01/03/2022 FINDINGS: Normal heart size. Lower mediastinal widening with gas suggesting hiatal hernia. There is no edema, consolidation, effusion, or  pneumothorax. Artifact from EKG leads. IMPRESSION: No active cardiopulmonary disease. Electronically Signed   By: Tiburcio Pea M.D.   On: 10/15/2022 06:12   DG Knee Complete 4 Views Left  Result Date: 10/14/2022 CLINICAL DATA:  Pain after fall EXAM: LEFT KNEE - COMPLETE 4 VIEW COMPARISON:  None Available. FINDINGS: Transverse minimally displaced fracture of the patella inferiorly. Moderate joint effusion. Adjacent soft tissue swelling in the lateral view. No additional fracture or dislocation. Minimal osteophytes of the medial and lateral compartments. Slight joint space loss of the lateral compartment. Osteopenia. IMPRESSION: Minimally displaced transverse fracture of the inferior aspect of the patella. Joint effusion and soft tissue swelling. Osteopenia Electronically Signed   By: Karen Kays M.D.   On: 10/14/2022 17:58   DG Elbow Complete Left  Result Date: 10/14/2022 CLINICAL DATA:  Pain after fall EXAM: LEFT ELBOW - COMPLETE 4 VIEW COMPARISON:  None Available. FINDINGS: Subtle nondisplaced radial neck fracture which is impacted.  No additional fracture or dislocation. Osteopenia. Preserved joint spaces. IMPRESSION: Subtle impacted radial neck fracture. Electronically Signed   By: Karen Kays M.D.   On: 10/14/2022 17:56   CT Head Wo Contrast  Result Date: 10/14/2022 CLINICAL DATA:  Head trauma, minor (Age >= 65y) EXAM: CT HEAD WITHOUT CONTRAST TECHNIQUE: Contiguous axial images were obtained from the base of the skull through the vertex without intravenous contrast. RADIATION DOSE REDUCTION: This exam was performed according to the departmental dose-optimization program which includes automated exposure control, adjustment of the mA and/or kV according to patient size and/or use of iterative reconstruction technique. COMPARISON:  CT Head 09/25/21 FINDINGS: Brain: No evidence of acute infarction, hemorrhage, hydrocephalus, extra-axial collection or mass lesion/mass effect. Sequela of moderate  chronic microvascular ischemic change. Generalized volume loss. Vascular: No hyperdense vessel or unexpected calcification. Skull: Soft tissue hematoma along the left frontal scalp. No evidence of an underlying calvarial fracture. Sinuses/Orbits: No middle ear or mastoid effusion. Paranasal sinuses are clear. Orbits are unremarkable. Bilateral lens replacements. Other: None. IMPRESSION: 1. No acute intracranial abnormality. 2. Soft tissue hematoma along the left frontal scalp. No evidence of an underlying calvarial fracture. Electronically Signed   By: Lorenza Cambridge M.D.   On: 10/14/2022 16:43     Medical Consultants:   None.   Subjective:    Brooke Weber complaints she relates he feels better.  Objective:    Vitals:   10/15/22 1319 10/15/22 1948 10/16/22 0428 10/16/22 0741  BP: (!) 145/88 (!) 158/81 131/89 (!) 179/81  Pulse: 84 80 87 73  Resp:  18 17 18   Temp: 98.4 F (36.9 C) 98 F (36.7 C) 98 F (36.7 C) 98 F (36.7 C)  TempSrc: Oral Oral  Oral  SpO2: 99% 100% 100% 100%  Weight:      Height:       SpO2: 100 %   Intake/Output Summary (Last 24 hours) at 10/16/2022 1003 Last data filed at 10/16/2022 0510 Gross per 24 hour  Intake 700.16 ml  Output 1250 ml  Net -549.84 ml   Filed Weights   10/15/22 0503  Weight: 68 kg    Exam: General exam: In no acute distress. Respiratory system: Good air movement and clear to auscultation. Cardiovascular system: S1 & S2 heard, RRR. No JVD.  Gastrointestinal system: Abdomen is nondistended, soft and nontender.  Extremities: No pedal edema. Skin: No rashes, lesions or ulcers   Data Reviewed:    Labs: Basic Metabolic Panel: Recent Labs  Lab 10/14/22 1712 10/15/22 0538 10/16/22 0140  NA 130* 129* 129*  K 3.4* 3.2* 3.2*  CL 95* 93* 91*  CO2 20* 24 25  GLUCOSE 99 146* 117*  BUN 11 10 7*  CREATININE 0.67 0.79 0.69  CALCIUM 8.9 8.1* 9.3   GFR Estimated Creatinine Clearance: 48.8 mL/min (by C-G formula based on  SCr of 0.69 mg/dL). Liver Function Tests: No results for input(s): "AST", "ALT", "ALKPHOS", "BILITOT", "PROT", "ALBUMIN" in the last 168 hours. No results for input(s): "LIPASE", "AMYLASE" in the last 168 hours. No results for input(s): "AMMONIA" in the last 168 hours. Coagulation profile No results for input(s): "INR", "PROTIME" in the last 168 hours. COVID-19 Labs  Recent Labs    10/15/22 1023  DDIMER 3.30*    No results found for: "SARSCOV2NAA"  CBC: Recent Labs  Lab 10/14/22 1712 10/15/22 0538 10/16/22 0140  WBC 11.6* 10.4 9.5  NEUTROABS 9.9* 9.0*  --   HGB 13.2 12.2 14.1  HCT 39.1 35.9* 40.1  MCV 93.8 92.1 91.8  PLT 232 213 272   Cardiac Enzymes: No results for input(s): "CKTOTAL", "CKMB", "CKMBINDEX", "TROPONINI" in the last 168 hours. BNP (last 3 results) No results for input(s): "PROBNP" in the last 8760 hours. CBG: Recent Labs  Lab 10/14/22 1625 10/15/22 0540  GLUCAP 113* 147*   D-Dimer: Recent Labs    10/15/22 1023  DDIMER 3.30*   Hgb A1c: No results for input(s): "HGBA1C" in the last 72 hours. Lipid Profile: No results for input(s): "CHOL", "HDL", "LDLCALC", "TRIG", "CHOLHDL", "LDLDIRECT" in the last 72 hours. Thyroid function studies: No results for input(s): "TSH", "T4TOTAL", "T3FREE", "THYROIDAB" in the last 72 hours.  Invalid input(s): "FREET3" Anemia work up: No results for input(s): "VITAMINB12", "FOLATE", "FERRITIN", "TIBC", "IRON", "RETICCTPCT" in the last 72 hours. Sepsis Labs: Recent Labs  Lab 10/14/22 1712 10/15/22 0538 10/16/22 0140  WBC 11.6* 10.4 9.5   Microbiology Recent Results (from the past 240 hour(s))  Urine Culture     Status: Abnormal (Preliminary result)   Collection Time: 10/14/22  4:37 PM   Specimen: Urine, Clean Catch  Result Value Ref Range Status   Specimen Description   Final    URINE, CLEAN CATCH Performed at [facility], [address], [place], [place] [address]    Special  Requests   Final    NONE Performed at [facility], [address] [place], [place] [address]    Culture (A)  Final    >=100,000 COLONIES/mL GRAM NEGATIVE RODS SUSCEPTIBILITIES TO FOLLOW Performed at [facility], [address] [place], [place] [address]    Report Status PENDING  Incomplete  Urine Culture (for pregnant, neutropenic or urologic patients or patients with an indwelling urinary catheter)     Status: Abnormal   Collection Time: 10/15/22  7:23 AM   Specimen: Urine, Clean Catch  Result Value Ref Range Status   Specimen Description URINE, CLEAN CATCH  Final   Special Requests   Final    NONE Performed at [facility], [address] [place], [place] [address]    Culture MULTIPLE SPECIES PRESENT, SUGGEST RECOLLECTION (A)  Final   Report Status 10/16/2022 FINAL  Final     Medications:    atorvastatin  20 mg Oral QHS   enoxaparin (LOVENOX) injection  40 mg Subcutaneous Q24H   pantoprazole  40 mg Oral Daily   polyvinyl alcohol  2 drop Both Eyes TID   sodium chloride flush  3 mL Intravenous Q12H   Continuous Infusions:  cefTRIAXone (ROCEPHIN)  IV Stopped (10/15/22 1203)   lactated ringers Stopped (10/15/22 1355)      LOS: 0 days   Marinda Elk  [facility]  10/16/2022, 10:03 AM

## 2022-10-16 NOTE — Evaluation (Signed)
Physical Therapy Evaluation Patient Details Name: Brooke Weber MRN: [id number] DOB: 1940-07-19 Today's Date: 10/16/2022  History of Present Illness  82 y.o. female presents to [facility] ED 8/13 via EMS after syncopal episode while sitting in recliner chair. BP in field 70/40. BP in ED 163/78 Seen at [facility] ED 8/12 after syncopal event with fall resulting in blow to left forehead, L fractured radius and L minimally displaced transverse fracture of the inferior aspect of the patella as well as acute cystitis. WUJ:[id number], hypertension, arthritis, anxiety, and GERD  Clinical Impression  Pt oriented to herself and relies on her husband for PLOF and home set up questions. Pt discharged from [facility] ED with L LE KI and WBAT. Pt's husband states he is unable to provide enough assistance when KI is in place and needed to remove KI for pt to urinate. No KI in room on Eval. Per husband pt has been able to walk with minor not debilitating knee pain without KI in place. Pt is contact guard for bed mobility, transfers and stepping to recliner. Pt limited in safe mobility by decreased short term memory, generalized weakness and associated minor balance deficits in presence of nocturnal urinary urgency and BP fluctuations. PT recommends HHPT for improving strength and balance.      Orthostatic BPs  Supine 168/85 (109)  Sitting 168/94 (110)  Standing 156/97 (111)  Sitting 161/97 (117)         If plan is discharge home, recommend the following: A little help with bathing/dressing/bathroom;A little help with walking and/or transfers;Assist for transportation;Help with stairs or ramp for entrance;Supervision due to cognitive status   Can travel by private vehicle    yes    Equipment Recommendations None recommended by PT     Functional Status Assessment Patient has had a recent decline in their functional status and demonstrates the ability to make significant improvements in function in a reasonable and predictable  amount of time.     Precautions / Restrictions Precautions Precautions: Fall Precaution Comments: syncopal falls Required Braces or Orthoses: Knee Immobilizer - Left;Sling Knee Immobilizer - Left: Other (comment) (not stated) Restrictions Weight Bearing Restrictions: Yes LLE Weight Bearing: Weight bearing as tolerated Other Position/Activity Restrictions: with KI in place with last ED visit      Mobility  Bed Mobility Overal bed mobility: Needs Assistance Bed Mobility: Supine to Sit     Supine to sit: HOB elevated, Used rails     General bed mobility comments: increased time and effort with use of bed rail to pull to EoB    Transfers Overall transfer level: Needs assistance Equipment used: Rolling walker (2 wheels) Transfers: Sit to/from Stand, Bed to chair/wheelchair/BSC Sit to Stand: Contact guard assist   Step pivot transfers: Contact guard assist       General transfer comment: vc for hand placement for power up, good power up, increased time for self steadying, slightly antalgic weightbearing on L LE with stepping to recliner    Ambulation/Gait               General Gait Details: deferred until ortho consult        Balance Overall balance assessment: Needs assistance Sitting-balance support: Feet supported, No upper extremity supported Sitting balance-[name]: Good     Standing balance support: During functional activity, Bilateral upper extremity supported Standing balance-[name]: Poor  Pertinent Vitals/Pain Pain Assessment Pain Assessment: No/denies pain    Home Living Family/patient expects to be discharged to:: Private residence Living Arrangements: Spouse/significant other Available Help at Discharge: Family;Available 24 hours/day (but husband runs errands and leaves her alone during the day) Type of Home: House Home Access: Stairs to enter Entrance Stairs-Rails: Left Entrance  Stairs-Number of Steps: 4   Home Layout: One level Home Equipment: Rollator (4 wheels);Cane - single point;Shower seat - built in      Prior Function Prior Level of Function : Independent/Modified Independent             Mobility Comments: uses cane in community and generally walls and furniture in her home ADLs Comments: independent with ADLs, husband assists with iADLs     Extremity/Trunk Assessment   Upper Extremity Assessment Upper Extremity Assessment: Defer to OT evaluation    Lower Extremity Assessment Lower Extremity Assessment: RLE deficits/detail;LLE deficits/detail RLE Deficits / Details: ROM WFL, MMT grossly 3/5 RLE Sensation: WNL RLE Coordination: WNL LLE Deficits / Details: ROM WFL, hip and ankle strength grossly 3/5 knee strength deferred due to patellar fx LLE Sensation: WNL LLE Coordination: WNL    Cervical / Trunk Assessment Cervical / Trunk Assessment: Kyphotic  Communication   Communication Communication: No apparent difficulties  Cognition Arousal: Alert Behavior During Therapy: WFL for tasks assessed/performed Overall Cognitive Status: History of cognitive impairments - at baseline Area of Impairment: Orientation, Safety/judgement, Problem solving, Awareness, Memory                 Orientation Level: Disoriented to, Place, Time, Situation   Memory: Decreased short-term memory   Safety/Judgement: Decreased awareness of deficits   Problem Solving: Requires verbal cues, Requires tactile cues General Comments: pt oriented to self, able to relate home set up with about 75% accuracy, poor recall of sequence of events leading up to hospitalization        General Comments General comments (skin integrity, edema, etc.): Pt husband relates he is not able to assist her mobilization with L LE KI and L UE sling in place. Had to remove to facilitate pt getting to toilet, left off after that. Husband reports pt needs to get up multiple times during  the night to urinate and she is often dizzy when this happens.     Assessment/Plan    PT Assessment Patient needs continued PT services  PT Problem List Decreased strength;Decreased activity tolerance;Decreased balance;Decreased mobility;Pain;Decreased safety awareness;Decreased cognition       PT Treatment Interventions DME instruction;Gait training;Stair training;Functional mobility training;Therapeutic exercise;Balance training;Patient/family education    PT Goals (Current goals can be found in the Care Plan section)  Acute Rehab PT Goals Patient Stated Goal: go home today PT Goal Formulation: With patient/family Time For Goal Achievement: 10/30/22 Potential to Achieve Goals: Fair    Frequency Min 1X/week     Co-evaluation PT/OT/SLP Co-Evaluation/Treatment: Yes Reason for Co-Treatment: Necessary to address cognition/behavior during functional activity;To address functional/ADL transfers PT goals addressed during session: Mobility/safety with mobility;Balance         AM-PAC PT "6 Clicks" Mobility  Outcome Measure Help needed turning from your back to your side while in a flat bed without using bedrails?: None Help needed moving from lying on your back to sitting on the side of a flat bed without using bedrails?: A Little Help needed moving to and from a bed to a chair (including a wheelchair)?: A Little Help needed standing up from a chair using your arms (e.g., wheelchair or  bedside chair)?: A Little Help needed to walk in hospital room?: A Lot Help needed climbing 3-5 steps with a railing? : A Lot 6 Click Score: 17    End of Session Equipment Utilized During Treatment: Gait belt Activity Tolerance: Patient tolerated treatment well Patient left: in chair;with chair alarm set;with family/visitor present Nurse Communication: Mobility status PT Visit Diagnosis: Repeated falls (R29.6);History of falling (Z91.81);Difficulty in walking, not elsewhere classified  (R26.2);Pain;Muscle weakness (generalized) (M62.81)    Time: 4098-1191 PT Time Calculation (min) (ACUTE ONLY): 42 min   Charges:   PT Evaluation $PT Eval Moderate Complexity: 1 Mod PT Treatments $Therapeutic Activity: 8-22 mins PT General Charges $$ ACUTE PT VISIT: 1 Visit          B. Beverely Risen PT, DPT Acute Rehabilitation Services Please use secure chat or  Call Office [phone]   Elon Alas [facility] 10/16/2022, 11:23 AM

## 2022-10-17 DIAGNOSIS — S82132A Displaced fracture of medial condyle of left tibia, initial encounter for closed fracture: Secondary | ICD-10-CM

## 2022-10-17 DIAGNOSIS — S52135D Nondisplaced fracture of neck of left radius, subsequent encounter for closed fracture with routine healing: Secondary | ICD-10-CM | POA: Diagnosis not present

## 2022-10-17 DIAGNOSIS — R55 Syncope and collapse: Secondary | ICD-10-CM | POA: Diagnosis not present

## 2022-10-17 DIAGNOSIS — I959 Hypotension, unspecified: Secondary | ICD-10-CM | POA: Diagnosis not present

## 2022-10-17 DIAGNOSIS — W19XXXA Unspecified fall, initial encounter: Secondary | ICD-10-CM | POA: Diagnosis not present

## 2022-10-17 LAB — BASIC METABOLIC PANEL
Anion gap: 10 (ref 5–15)
BUN: 8 mg/dL (ref 8–23)
CO2: 23 mmol/L (ref 22–32)
Calcium: 9.2 mg/dL (ref 8.9–10.3)
Chloride: 96 mmol/L — ABNORMAL LOW (ref 98–111)
Creatinine, Ser: 0.64 mg/dL (ref 0.44–1.00)
GFR, Estimated: >60 mL/min (ref >60)
Glucose, Bld: 103 mg/dL — ABNORMAL HIGH (ref 70–99)
Potassium: 4 mmol/L (ref 3.5–5.1)
Sodium: 129 mmol/L — ABNORMAL LOW (ref 135–145)

## 2022-10-17 MED ORDER — ATENOLOL 50 MG PO TABS
100.0000 mg | ORAL_TABLET | Freq: Every day | ORAL | Status: DC
  Administered 2022-10-17 – 2022-10-21 (×5): 100 mg via ORAL
  Filled 2022-10-17 (×5): qty 2

## 2022-10-17 MED ORDER — LISINOPRIL 20 MG PO TABS
20.0000 mg | ORAL_TABLET | Freq: Every day | ORAL | Status: DC
  Administered 2022-10-17 – 2022-10-22 (×6): 20 mg via ORAL
  Filled 2022-10-17 (×6): qty 1

## 2022-10-17 NOTE — Progress Notes (Addendum)
  Inpatient Rehab Admissions Coordinator :  Per therapy recommendations patient was screened for CIR candidacy by Ottie Glazier RN MSN. Patient is not at a level to tolerate the intensity required to pursue a CIR admit . Noted limited today as await hinged knee brace locked in extension to arrive. Yesterday husband stated he was unable to provide enough assistance when KI in place. I question that also with brace. Recommend other rehab venues to be pursued at this time. I am not placing a rehab consult. Please contact me with any questions.  Ottie Glazier RN MSN Admissions Coordinator [phone]

## 2022-10-17 NOTE — Plan of Care (Signed)
°  Problem: Clinical Measurements: °Goal: Ability to maintain clinical measurements within normal limits will improve °Outcome: Progressing °Goal: Will remain free from infection °Outcome: Progressing °Goal: Diagnostic test results will improve °Outcome: Progressing °Goal: Respiratory complications will improve °Outcome: Progressing °  °

## 2022-10-17 NOTE — Progress Notes (Signed)
Physical Therapy Treatment Patient Details Name: Brooke Weber MRN: [id number] DOB: [birth year] Today's Date: 10/17/2022   History of Present Illness 82 y.o. female presents to [facility] ED 8/13 via EMS after syncopal episode while sitting in recliner chair. BP in field 70/40. BP in ED 163/78 Seen at [facility] ED 8/12 after syncopal event with fall resulting in blow to left forehead, L fractured radius and L minimally displaced transverse fracture of the inferior aspect of the patella as well as acute cystitis. NWG:NFAOZHYQ, hypertension, arthritis, anxiety, and GERD    PT Comments  Had MD clarify with orthopedics L UE and LE weightbearing status. Pt to be L LE WBAT with KI in place, and L UE weightbearing through elbow. Hinged knee brace locked in extension ordered but not arrived prior to session so treatment limited to bed level exercise. Husband discussed possibility of AIR prior to return home as pt had so much trouble with mobility after prior ED visit. Consult requested. PT will follow back form more comprehensive treatment session tomorrow.     If plan is discharge home, recommend the following: A little help with bathing/dressing/bathroom;A little help with walking and/or transfers;Assist for transportation;Help with stairs or ramp for entrance;Supervision due to cognitive status     Equipment Recommendations  None recommended by PT    Recommendations for Other Services Rehab consult     Precautions / Restrictions Precautions Precautions: Fall Precaution Comments: syncopal falls Required Braces or Orthoses: Knee Immobilizer - Left;Sling Knee Immobilizer - Left: On at all times Restrictions Weight Bearing Restrictions: Yes LUE Weight Bearing: Weight bear through elbow only LLE Weight Bearing: Weight bearing as tolerated Other Position/Activity Restrictions: with KI in place with last ED visit     Mobility  Bed Mobility               General bed mobility comments: deferred until KI  arrives                            Balance Overall balance assessment: Needs assistance Sitting-balance support: Feet supported, No upper extremity supported Sitting balance-[name]: Good     Standing balance support: During functional activity, Bilateral upper extremity supported Standing balance-[name]: Poor                              Cognition Arousal: Alert Behavior During Therapy: WFL for tasks assessed/performed Overall Cognitive Status: History of cognitive impairments - at baseline Area of Impairment: Orientation, Safety/judgement, Problem solving, Awareness, Memory                 Orientation Level: Disoriented to, Place, Time, Situation   Memory: Decreased short-term memory   Safety/Judgement: Decreased awareness of deficits   Problem Solving: Requires verbal cues, Requires tactile cues General Comments: pt with very poor short term memory, multiple times during session asks if she is going home despite agreeing to AIR,        Exercises General Exercises - Lower Extremity Ankle Circles/Pumps: Both, AROM, 15 reps, Supine Quad Sets: Right, AROM, 10 reps, Supine Gluteal Sets: Both, AROM, 10 reps, Supine Heel Slides: Right, AROM, 10 reps, Supine Hip ABduction/ADduction: Right, AROM, 10 reps, Supine Straight Leg Raises: Right, AROM, 10 reps, Supine    General Comments General comments (skin integrity, edema, etc.): Discussed weightbearing status and likely effect on pt mobility and safety, husband requesting AIR referral  Pertinent Vitals/Pain Pain Assessment Pain Assessment: Faces Faces Pain Scale: Hurts even more Pain Location: L knee and shoulder Pain Descriptors / Indicators: Aching, Sore, Throbbing Pain Intervention(s): Limited activity within patient's tolerance, Monitored during session, Patient requesting pain meds-RN notified     PT Goals (current goals can now be found in the care plan section) Acute  Rehab PT Goals Patient Stated Goal: go home today PT Goal Formulation: With patient/family Time For Goal Achievement: 10/30/22 Potential to Achieve Goals: Fair Progress towards PT goals: Not progressing toward goals - comment    Frequency    Min 1X/week       AM-PAC PT "6 Clicks" Mobility   Outcome Measure  Help needed turning from your back to your side while in a flat bed without using bedrails?: None Help needed moving from lying on your back to sitting on the side of a flat bed without using bedrails?: A Little Help needed moving to and from a bed to a chair (including a wheelchair)?: A Little Help needed standing up from a chair using your arms (e.g., wheelchair or bedside chair)?: A Little Help needed to walk in hospital room?: A Lot Help needed climbing 3-5 steps with a railing? : A Lot 6 Click Score: 17    End of Session Equipment Utilized During Treatment: Gait belt Activity Tolerance: Patient tolerated treatment well Patient left: with family/visitor present;in bed;with call bell/phone within reach;with bed alarm set Nurse Communication: Mobility status PT Visit Diagnosis: Repeated falls (R29.6);History of falling (Z91.81);Difficulty in walking, not elsewhere classified (R26.2);Pain;Muscle weakness (generalized) (M62.81)     Time: 6578-4696 PT Time Calculation (min) (ACUTE ONLY): 17 min  Charges:    $Therapeutic Exercise: 8-22 mins PT General Charges $$ ACUTE PT VISIT: 1 Visit                      B. Beverely Risen PT, DPT Acute Rehabilitation Services Please use secure chat or  Call Office [phone]    Elon Alas [facility] 10/17/2022, 4:13 PM

## 2022-10-17 NOTE — Progress Notes (Signed)
Orthopedic Tech Progress Note Patient Details:  Brooke Weber 06/04/1940 [id number]   Order for ROM knee braced (locked for the LLE) called into [facility].  Patient ID: [name], female   DOB: 12-11-1940, 82 y.o.   MRN: [id number]  Docia Furl 10/17/2022, 2:23 PM

## 2022-10-17 NOTE — Progress Notes (Signed)
[facility] PROGRESS NOTE    Progress Note  Brooke Weber  ZOX:[id number] DOB: [birth year] DOA: 10/15/2022 PCP: Daisy Floro, MD     Brief Narrative:   Brooke Weber is an 82 y.o. female past medical history of essential hypertension anxiety recently seen in the emergency room and was not for fall found to the ground face first, denied any loss of consciousness imaging showed no acute findings UA was possibly concerning for UTI was given antibiotics and discharged home with Keflex.  Since he got home he is relates he has not been able to urinate did not pick up his prescription, EMS was called when she passed out found to be hypotensive on arrival to the ED was found to have an elevated blood pressure sodium 129 potassium 3.2 chest x-ray showed no acute findings.   Assessment/Plan:   Sepsis secondary to Ecoli UTI, POA -Hypothermic, tachypneic with notable source -Completed ceftriaxone course -Recently prescribed Keflex in the outpatient setting which was not picked up.  Syncope and collapse/fall Likely orthostatic hypotension complicated by polypharmacy and sepsis/UTI -secondary to hypotension; likely complicated by sepsis as above -Rule out concurrent PE(CTA pending) -Echo EF 60 to 65% with no regional wall motion abnormalities indeterminate diastolic function -High risk for polypharmacy Home medications include alprazolam oxycodone and trazodone -will continue to hold these, patient would benefit from weaning if not discontinuation of these medications at her age per beers criteria. -Orthostatics remain markedly positive despite IV fluids over the past 48 hours -continue to follow -EKG without acute findings - follow tele -Fractures making ambulation difficult (see below)  Left medial tibial plateau fracture/  Fracture of radial neck, left, closed Discussed with Dr Idamae Schuller) - recommending a platform walker and knee immbobilizer - follow up in 1 week for  evaluation PT OT eval is ongoing, left knee pain and left arm pain limit patient is ambulatory status quite drastically.  Hypokalemia: Improving  Hypovolemic hyponatremia: Continue IVF - increase PO intake as tolerated  Chronic pain/long-term opiate use: Continue current medication -recommend discussion with PCP to consider weaning as appropriate  Hypertension  -Resume home lisinopril and atenolol -follow orthostatics closely  Hyperlipidemia Continue statins.  DVT prophylaxis: enoxaparin (LOVENOX) injection 40 mg Start: 10/15/22 1030   Code Status: Full Code Family Communication:none Status is: Inpt   Procedures and diagnostic studies:   ECHOCARDIOGRAM COMPLETE  Result Date: 10/15/2022    ECHOCARDIOGRAM REPORT   Patient Name:   Brooke Weber Date of Exam: 10/15/2022 Medical Rec #:  [id number]         Height:       65.0 in Accession #:    [id number]        Weight:       149.9 lb Date of Birth:  1940-12-24          BSA:          1.750 m Patient Age:    82 years          BP:           145/88 mmHg Patient Gender: F                 HR:           70 bpm. Exam Location:  Inpatient Procedure: 2D Echo, Color Doppler and Cardiac Doppler Indications:    Syncope  History:        Patient has no prior history of Echocardiogram examinations.  Signs/Symptoms:Syncope; Risk Factors:Hypertension.  Sonographer:    Darlys Gales Referring Phys: Loreta Ave SMITH IMPRESSIONS  1. Left ventricular ejection fraction, by estimation, is 60 to 65%. The left ventricle has normal function. The left ventricle has no regional wall motion abnormalities. Indeterminate diastolic filling due to E-A fusion.  2. Right ventricular systolic function is normal. The right ventricular size is normal.  3. Left atrial size was mildly dilated.  4. The mitral valve is normal in structure. No evidence of mitral valve regurgitation. No evidence of mitral stenosis.  5. The aortic valve is tricuspid. Aortic valve  regurgitation is mild to moderate. Aortic valve sclerosis is present, with no evidence of aortic valve stenosis.  6. The inferior vena cava is normal in size with greater than 50% respiratory variability, suggesting right atrial pressure of 3 mmHg. Comparison(s): No prior Echocardiogram. FINDINGS  Left Ventricle: Left ventricular ejection fraction, by estimation, is 60 to 65%. The left ventricle has normal function. The left ventricle has no regional wall motion abnormalities. The left ventricular internal cavity size was normal in size. There is  no left ventricular hypertrophy. Indeterminate diastolic filling due to E-A fusion. Right Ventricle: The right ventricular size is normal. No increase in right ventricular wall thickness. Right ventricular systolic function is normal. Left Atrium: Left atrial size was mildly dilated. Right Atrium: Right atrial size was normal in size. Pericardium: There is no evidence of pericardial effusion. Mitral Valve: The mitral valve is normal in structure. No evidence of mitral valve regurgitation. No evidence of mitral valve stenosis. Tricuspid Valve: The tricuspid valve is normal in structure. Tricuspid valve regurgitation is not demonstrated. No evidence of tricuspid stenosis. Aortic Valve: The aortic valve is tricuspid. Aortic valve regurgitation is mild to moderate. Aortic regurgitation PHT measures 380 msec. Aortic valve sclerosis is present, with no evidence of aortic valve stenosis. Aortic valve mean gradient measures 7.0  mmHg. Aortic valve peak gradient measures 13.7 mmHg. Aortic valve area, by VTI measures 2.06 cm. Pulmonic Valve: The pulmonic valve was not well visualized. Pulmonic valve regurgitation is mild. No evidence of pulmonic stenosis. Aorta: The aortic root and ascending aorta are structurally normal, with no evidence of dilitation. Venous: The inferior vena cava is normal in size with greater than 50% respiratory variability, suggesting right atrial pressure  of 3 mmHg. IAS/Shunts: No atrial level shunt detected by color flow Doppler.  LEFT VENTRICLE PLAX 2D LVIDd:         4.30 cm   Diastology LVIDs:         2.30 cm   LV e' medial:    6.42 cm/s LV PW:         0.90 cm   LV E/e' medial:  10.0 LV IVS:        0.90 cm   LV e' lateral:   7.40 cm/s LVOT diam:     1.70 cm   LV E/e' lateral: 8.7 LV SV:         67 LV SV Index:   38 LVOT Area:     2.27 cm  RIGHT VENTRICLE RV S prime:     17.20 cm/s TAPSE (M-mode): 2.4 cm LEFT ATRIUM             Index        RIGHT ATRIUM           Index LA Vol (A2C):   53.9 ml 30.80 ml/m  RA Area:     13.80 cm LA Vol (A4C):  72.2 ml 41.26 ml/m  RA Volume:   35.00 ml  20.00 ml/m LA Biplane Vol: 63.2 ml 36.11 ml/m  AORTIC VALVE AV Area (Vmax):    1.63 cm AV Area (Vmean):   1.53 cm AV Area (VTI):     2.06 cm AV Vmax:           185.00 cm/s AV Vmean:          130.000 cm/s AV VTI:            0.324 m AV Peak Grad:      13.7 mmHg AV Mean Grad:      7.0 mmHg LVOT Vmax:         133.00 cm/s LVOT Vmean:        87.800 cm/s LVOT VTI:          0.294 m LVOT/AV VTI ratio: 0.91 AI PHT:            380 msec  AORTA Ao Root diam: 2.80 cm MITRAL VALVE                TRICUSPID VALVE MV Area (PHT): 4.08 cm     TR Peak grad:   31.6 mmHg MV Decel Time: 186 msec     TR Vmax:        281.00 cm/s MV E velocity: 64.50 cm/s MV A velocity: 115.00 cm/s  SHUNTS MV E/A ratio:  0.56         Systemic VTI:  0.29 m                             Systemic Diam: 1.70 cm Riley Lam MD Electronically signed by Riley Lam MD Signature Date/Time: 10/15/2022/4:55:38 PM    Final      Medical Consultants:   None.   Subjective:    KEMBA LEEDOM complaints she relates he feels better.  Objective:    Vitals:   10/16/22 0741 10/16/22 1438 10/16/22 1939 10/17/22 0453  BP: (!) 179/81 (!) 145/74 (!) 160/82 (!) 152/78  Pulse: 73 78 95 84  Resp: 18 20 17 16   Temp: 98 F (36.7 C) 98.2 F (36.8 C) 98.3 F (36.8 C) 98.5 F (36.9 C)  TempSrc: Oral Oral   Oral  SpO2: 100% 100% 100% 100%  Weight:      Height:       SpO2: 100 %   Intake/Output Summary (Last 24 hours) at 10/17/2022 0655 Last data filed at 10/16/2022 1600 Gross per 24 hour  Intake 428.97 ml  Output --  Net 428.97 ml   Filed Weights   10/15/22 0503  Weight: 68 kg    Exam: General exam: In no acute distress. Respiratory system: Good air movement and clear to auscultation. Cardiovascular system: S1 & S2 heard, RRR. No JVD.  Gastrointestinal system: Abdomen is nondistended, soft and nontender.  Extremities: No pedal edema. Skin: No rashes, lesions or ulcers   Data Reviewed:    Labs: Basic Metabolic Panel: Recent Labs  Lab 10/14/22 1712 10/15/22 0538 10/16/22 0140  NA 130* 129* 129*  K 3.4* 3.2* 3.2*  CL 95* 93* 91*  CO2 20* 24 25  GLUCOSE 99 146* 117*  BUN 11 10 7*  CREATININE 0.67 0.79 0.69  CALCIUM 8.9 8.1* 9.3   GFR Estimated Creatinine Clearance: 48.8 mL/min (by C-G formula based on SCr of 0.69 mg/dL). Liver Function Tests: No results for input(s): "AST", "ALT", "ALKPHOS", "BILITOT", "PROT", "ALBUMIN" in  the last 168 hours. No results for input(s): "LIPASE", "AMYLASE" in the last 168 hours. No results for input(s): "AMMONIA" in the last 168 hours. Coagulation profile No results for input(s): "INR", "PROTIME" in the last 168 hours. COVID-19 Labs  Recent Labs    10/15/22 1023  DDIMER 3.30*    No results found for: "SARSCOV2NAA"  CBC: Recent Labs  Lab 10/14/22 1712 10/15/22 0538 10/16/22 0140  WBC 11.6* 10.4 9.5  NEUTROABS 9.9* 9.0*  --   HGB 13.2 12.2 14.1  HCT 39.1 35.9* 40.1  MCV 93.8 92.1 91.8  PLT 232 213 272   Cardiac Enzymes: No results for input(s): "CKTOTAL", "CKMB", "CKMBINDEX", "TROPONINI" in the last 168 hours. BNP (last 3 results) No results for input(s): "PROBNP" in the last 8760 hours. CBG: Recent Labs  Lab 10/14/22 1625 10/15/22 0540  GLUCAP 113* 147*   D-Dimer: Recent Labs    10/15/22 1023  DDIMER  3.30*   Hgb A1c: No results for input(s): "HGBA1C" in the last 72 hours. Lipid Profile: No results for input(s): "CHOL", "HDL", "LDLCALC", "TRIG", "CHOLHDL", "LDLDIRECT" in the last 72 hours. Thyroid function studies: No results for input(s): "TSH", "T4TOTAL", "T3FREE", "THYROIDAB" in the last 72 hours.  Invalid input(s): "FREET3" Anemia work up: No results for input(s): "VITAMINB12", "FOLATE", "FERRITIN", "TIBC", "IRON", "RETICCTPCT" in the last 72 hours. Sepsis Labs: Recent Labs  Lab 10/14/22 1712 10/15/22 0538 10/16/22 0140  WBC 11.6* 10.4 9.5   Microbiology Recent Results (from the past 240 hour(s))  Urine Culture     Status: Abnormal (Preliminary result)   Collection Time: 10/14/22  4:37 PM   Specimen: Urine, Clean Catch  Result Value Ref Range Status   Specimen Description   Final    URINE, CLEAN CATCH Performed at [facility], [address] [place], [place] [address]    Special Requests   Final    NONE Performed at [facility], [address], [place], [place] [address]    Culture (A)  Final    >=100,000 COLONIES/mL ESCHERICHIA COLI SUSCEPTIBILITIES TO FOLLOW Performed at [facility], [address] [place], [place] [address]    Report Status PENDING  Incomplete  Urine Culture (for pregnant, neutropenic or urologic patients or patients with an indwelling urinary catheter)     Status: Abnormal   Collection Time: 10/15/22  7:23 AM   Specimen: Urine, Clean Catch  Result Value Ref Range Status   Specimen Description URINE, CLEAN CATCH  Final   Special Requests   Final    NONE Performed at [facility], [address] [place], [place] [address]    Culture MULTIPLE SPECIES PRESENT, SUGGEST RECOLLECTION (A)  Final   Report Status 10/16/2022 FINAL  Final     Medications:    atorvastatin  20 mg Oral QHS   enoxaparin (LOVENOX) injection  40 mg Subcutaneous Q24H   pantoprazole  40 mg Oral Daily   polyvinyl  alcohol  2 drop Both Eyes TID   sodium chloride flush  3 mL Intravenous Q12H   Continuous Infusions:  cefTRIAXone (ROCEPHIN)  IV 1 g (10/16/22 1140)     LOS: 1 day   Azucena Fallen  [facility]  10/17/2022, 6:55 AM

## 2022-10-18 ENCOUNTER — Telehealth (HOSPITAL_BASED_OUTPATIENT_CLINIC_OR_DEPARTMENT_OTHER): Payer: Self-pay | Admitting: *Deleted

## 2022-10-18 ENCOUNTER — Inpatient Hospital Stay (HOSPITAL_COMMUNITY): Payer: Medicare Other

## 2022-10-18 DIAGNOSIS — R55 Syncope and collapse: Secondary | ICD-10-CM | POA: Diagnosis not present

## 2022-10-18 MED ORDER — TECHNETIUM TO 99M ALBUMIN AGGREGATED
4.3000 | Freq: Once | INTRAVENOUS | Status: AC | PRN
  Administered 2022-10-18: 4.3 via INTRAVENOUS

## 2022-10-18 MED ORDER — LIDOCAINE 5 % EX PTCH: 1.0000 | MEDICATED_PATCH | CUTANEOUS | Status: DC

## 2022-10-18 NOTE — Telephone Encounter (Signed)
Post ED Visit - Positive Culture Follow-up  Culture report reviewed by antimicrobial stewardship pharmacist: [facility] []  Enzo Bi, Pharm.D. []  Celedonio Miyamoto, Pharm.D., BCPS AQ-ID []  Garvin Fila, Pharm.D., BCPS []  Georgina Pillion, Pharm.D., BCPS []  [place], [place].D., BCPS, AAHIVP []  Estella Husk, Pharm.D., BCPS, AAHIVP []  Lysle Pearl, PharmD, BCPS []  Phillips Climes, PharmD, BCPS []  Agapito Games, PharmD, BCPS []  Verlan Friends, PharmD []  Mervyn Gay, PharmD, BCPS []  Vinnie Level, PharmD  [facility] [x]  Nicole Kindred, PharmD []  Greer Pickerel, PharmD []  Adalberto Cole, PharmD []  Perlie Gold, Rph []  Lonell Face) Jean Rosenthal, PharmD []  Earl Many, PharmD []  Junita Push, PharmD []  Dorna Leitz, PharmD []  Terrilee Files, PharmD []  Lynann Beaver, PharmD []  Keturah Barre, PharmD []  Loralee Pacas, PharmD []  Bernadene Person, PharmD   Positive urine culture Treated with Cehapexin, organism sensitive to the same and no further patient follow-up is required at this time.  Bing Quarry 10/18/2022, 11:50 AM

## 2022-10-18 NOTE — TOC Initial Note (Addendum)
Transition of Care [facility]) - Initial/Assessment Note    Patient Details  Name: Brooke Weber MRN: [id number] Date of Birth: 10/30/1940  Transition [facility]) CM/SW Contact:    Lorri Frederick, LCSW Phone Number: 10/18/2022, 12:10 PM  Clinical Narrative:     CSW spoke with pt by phone.  Pt oriented x3 but able to participate in conversation.  Pt agreeable to PT recommendation for SNF, permission given to send out referral in hub.  Pt from home with husband Earvin Hansen, no current services.  CSW spoke with husband, also by phone.  He is also in agreement with plan for SNF, asking for [facility].  Referral sent out in hub for SNF.  CSW reached out to [facility]        1315: Additional info uploaded to [facility] Must         Expected Discharge Plan: Skilled Nursing Facility Barriers to Discharge: Continued Medical Work up, SNF Pending bed offer   Patient Goals and CMS Choice Patient states their goals for this hospitalization and ongoing recovery are:: move around, do what I usually do          Expected Discharge Plan and Services In-house Referral: Clinical Social Work   Post Acute Care Choice: Skilled Nursing Facility Living arrangements for the past 2 months: Single Family Home                                      Prior Living Arrangements/Services Living arrangements for the past 2 months: Single Family Home Lives with:: Spouse Patient language and need for interpreter reviewed:: Yes Do you feel safe going back to the place where you live?: Yes      Need for Family Participation in Patient Care: Yes (Comment) Care giver support system in place?: Yes (comment) Current home services: Other (comment) (none) Criminal Activity/Legal Involvement Pertinent to Current Situation/Hospitalization: No - Comment as needed  Activities of Daily Living Home Assistive Devices/Equipment: Cane (specify quad or straight) ADL Screening (condition at time of admission) Patient's  cognitive ability adequate to safely complete daily activities?: No Is the patient deaf or have difficulty hearing?: No Does the patient have difficulty seeing, even when wearing glasses/contacts?: No Does the patient have difficulty concentrating, remembering, or making decisions?: Yes Patient able to express need for assistance with ADLs?: Yes Does the patient have difficulty dressing or bathing?: Yes Independently performs ADLs?: No Communication: Appropriate for developmental age Dressing (OT): Needs assistance Is this a change from baseline?: Change from baseline, expected to last <3days Grooming: Needs assistance Is this a change from baseline?: Change from baseline, expected to last <3 days Feeding: Independent with device (comment) Bathing: Needs assistance Is this a change from baseline?: Change from baseline, expected to last <3 days Toileting: Needs assistance Is this a change from baseline?: Change from baseline, expected to last <3 days In/Out Bed: Needs assistance Is this a change from baseline?: Change from baseline, expected to last <3 days Walks in Home: Needs assistance Is this a change from baseline?: Change from baseline, expected to last >3 days Does the patient have difficulty walking or climbing stairs?: Yes Weakness of Legs: Both Weakness of Arms/Hands: Both  Permission Sought/Granted Permission sought to share information with : Family Supports Permission granted to share information with : Yes, Verbal Permission Granted  Share Information with NAME: husband Earvin Hansen  Permission granted to share info w AGENCY: SNF  Emotional Assessment Appearance::  (phone contact) Attitude/Demeanor/Rapport: Engaged Affect (typically observed): Appropriate, Pleasant Orientation: : Oriented to Self, Oriented to Place, Oriented to Situation      Admission diagnosis:  Syncope [R55] Syncope, unspecified syncope type [R55] UTI (urinary tract infection)  [N39.0] Patient Active Problem List   Diagnosis Date Noted   Syncope 10/15/2022   Left medial tibial plateau fracture 10/15/2022   Fracture of radial neck, left, closed 10/15/2022   Fall at home, initial encounter 10/15/2022   UTI (urinary tract infection) 10/15/2022   Transient hypotension 10/15/2022   Essential hypertension 09/26/2021   Gastroesophageal reflux disease 09/26/2021   Mild cognitive impairment 09/26/2021   Hypokalemia 09/26/2021   Hyponatremia 09/26/2021   Chronic hyponatremia 09/25/2021   Chronic pain syndrome 03/20/2017   Long-term current use of opiate analgesic 03/20/2017   PCP:  Daisy Floro, MD Pharmacy:   [id number] - [place], [place] - [address] [place] [address] Phone: [phone] Fax: [phone]  [facility] [address] [place] [address] Phone: [phone] Fax: [phone]     Social Determinants of Health (SDOH) Social History: SDOH Screenings   Food Insecurity: No Food Insecurity (10/15/2022)  Housing: Low Risk  (10/15/2022)  Transportation Needs: No Transportation Needs (10/15/2022)  Utilities: Not At Risk (10/15/2022)  Financial Resource Strain: Low Risk  (02/16/2020)   Received from [facility] visits prior to 05/04/2022., [facility] visits prior to 05/04/2022.  Social Connections: Unknown (02/16/2020)   Received from [facility] visits prior to 05/04/2022., [facility] visits prior to 05/04/2022.  Tobacco Use: Low Risk  (10/15/2022)   SDOH Interventions:     Readmission Risk Interventions     No data to display

## 2022-10-18 NOTE — Progress Notes (Signed)
Progress Note   Patient: Brooke KARMANN Weber:[id number] DOB: 08-19-1940 DOA: 10/15/2022     2 DOS: the patient was seen and examined on 10/18/2022   Brief hospital course: Brooke Weber is an 82 y.o. female past medical history of essential hypertension anxiety recently seen in the emergency room and was not for fall found to the ground face first, denied any loss of consciousness imaging showed no acute findings UA was possibly concerning for UTI was given antibiotics and discharged home with Keflex.  Since he got home he is relates he has not been able to urinate did not pick up his prescription, EMS was called when she passed out found to be hypotensive on arrival to the ED was found to have an elevated blood pressure sodium 129 potassium 3.2 chest x-ray showed no acute findings.  Assessment and Plan: Principal Problem:   Syncope In the setting of recent:   UTI (urinary tract infection) Urine culture with multi species growth. Echo  -EF 60 to 65% with: -No regional wall motion abnormalities. -Indeterminate diastolic function  Active Problems:   Transient hypotension Resolved. Monitor blood pressure.    Essential hypertension Continue atenolol 100 mg p.o. bedtime. Hold if heart rate is less than    Fall at home, initial encounter   Left medial tibial plateau fracture   Fracture of radial neck, left, closed Continue immobilization. Continue analgesics as needed. Follow-up as an outpatient with orthopedic surgery.    Hypokalemia Resolved.    Hyponatremia   Chronic hyponatremia Improving. Follow potassium level in AM.    Chronic pain syndrome   Long-term current use of opiate analgesic Analgesics as needed.  Subjective: No acute distress.  Confused.  Husband at bedside and provides information.  Physical Exam: Vitals:   10/17/22 1353 10/17/22 2001 10/18/22 0509 10/18/22 0850  BP: (!) 160/73 (!) 147/134 132/78 136/65  Pulse: 100 95 61 (!) 56  Resp: 18 18 18 18    Temp: 97.6 F (36.4 C) 98.3 F (36.8 C) 98.2 F (36.8 C) 97.7 F (36.5 C)  TempSrc:  Oral Oral Oral  SpO2: 99% 99% 98% 98%  Weight:      Height:       Physical Exam Vitals and nursing note reviewed.  Constitutional:      General: She is awake. She is not in acute distress. HENT:     Head: Normocephalic.     Nose: No rhinorrhea.     Mouth/Throat:     Mouth: Mucous membranes are moist.  Eyes:     General: No scleral icterus.    Pupils: Pupils are equal, round, and reactive to light.  Cardiovascular:     Rate and Rhythm: Normal rate and regular rhythm.  Pulmonary:     Effort: Pulmonary effort is normal.     Breath sounds: Normal breath sounds.  Abdominal:     General: Bowel sounds are normal. There is no distension.     Palpations: Abdomen is soft.     Tenderness: There is no abdominal tenderness.  Musculoskeletal:     Cervical back: Neck supple.     Right lower leg: No edema.     Left lower leg: No edema.  Skin:    General: Skin is warm and dry.  Neurological:     Mental Status: She is alert. Mental status is at baseline. She is disoriented.  Psychiatric:        Mood and Affect: Mood normal.        Behavior:  Behavior normal. Behavior is cooperative.   Data Reviewed:  There are no new results to review at this time.  Family Communication:   Disposition: Status is: Inpatient Remains inpatient appropriate because:   Planned Discharge Destination: Home  Time spent:  minutes  Author: Bobette Mo, MD 10/18/2022 8:52 AM  For on call review [email or web address].   This document was prepared using Dragon voice recognition software and may contain some unintended transcription errors.

## 2022-10-18 NOTE — Progress Notes (Signed)
Occupational Therapy Treatment Patient Details Name: Brooke Weber MRN: [id number] DOB: [birth year] Today's Date: 10/18/2022   History of present illness 82 y.o. female presents to [facility] ED 8/13 via EMS after syncopal episode while sitting in recliner chair. BP in field 70/40. BP in ED 163/78 Seen at [facility] ED 8/12 after syncopal event with fall resulting in blow to left forehead, L fractured radius and L minimally displaced transverse fracture of the inferior aspect of the patella as well as acute cystitis. [id number], hypertension, arthritis, anxiety, and GERD   OT comments  Pt with minimal report of pain. CGA for supine to sit, stood with min assist and ambulated to bathroom with RW for toileting with CGA. Completed pericare in sitting with set up and washed groomed with CGA to min assist. Pt returned to bed with min assist of L LE for transport to test. Long conversation with husband about discharge plan and difficulty he is having managing pt at home with her memory deficits. Pt does require multimodal cues to avoid pushing through L wrist and for L LE back into bed. Pt needs to be able to get in and out of bed and take herself back and for the to the bathroom without assist at night for husband to manage her at home. Patient will benefit from continued inpatient follow up therapy, <3 hours/day.       If plan is discharge home, recommend the following:  A little help with walking and/or transfers;Direct supervision/assist for medications management;Direct supervision/assist for financial management;Assist for transportation;Help with stairs or ramp for entrance;Supervision due to cognitive status;A little help with bathing/dressing/bathroom   Equipment Recommendations  BSC/3in1    Recommendations for Other Services      Precautions / Restrictions Precautions Precautions: Fall Precaution Comments: syncopal falls Required Braces or Orthoses: Knee Immobilizer - Left Knee Immobilizer - Left: On  at all times Restrictions Weight Bearing Restrictions: Yes LUE Weight Bearing: Weight bear through elbow only LLE Weight Bearing: Weight bearing as tolerated Other Position/Activity Restrictions: hinged knee brace locked in extension       Mobility Bed Mobility Overal bed mobility: Needs Assistance Bed Mobility: Supine to Sit, Sit to Supine     Supine to sit: HOB elevated, Used rails, Contact guard Sit to supine: Min assist   General bed mobility comments: cues to avoid using L UE, assist only for L LE back into bed, increased time    Transfers Overall transfer level: Needs assistance Equipment used: Rolling walker (2 wheels) Transfers: Sit to/from Stand Sit to Stand: Min assist, Contact guard assist           General transfer comment: CGA from BSC, min from low bed, multimodal cues for hand placement     Balance Overall balance assessment: Needs assistance   Sitting balance-[name]: Good       Standing balance-[name]: Poor Standing balance comment: can release walker in static standing at sink, RW dependent for ambulation                           ADL either performed or assessed with clinical judgement   ADL Overall ADL's : Needs assistance/impaired     Grooming: Wash/dry hands;Standing;Minimal assistance           Upper Body Dressing : Sitting;Set up       Toilet Transfer: Contact guard assist;Ambulation;Rolling walker (2 wheels);BSC/3in1   Toileting- Clothing Manipulation and Hygiene: Set up;Sitting/lateral lean  Functional mobility during ADLs: Contact guard assist;Rolling walker (2 wheels)      Extremity/Trunk Assessment              Vision       Perception     Praxis      Cognition Arousal: Alert Behavior During Therapy: WFL for tasks assessed/performed Overall Cognitive Status: History of cognitive impairments - at baseline                                 General Comments: pt  needing multiple cues to avoid pushing through L wrist        Exercises      Shoulder Instructions       General Comments VSS on RA    Pertinent Vitals/ Pain       Pain Assessment Pain Assessment: Faces Faces Pain Scale: Hurts a little bit Pain Location: L knee, L wrist Pain Descriptors / Indicators: Discomfort Pain Intervention(s): Monitored during session, Repositioned, Premedicated before session  Home Living                                          Prior Functioning/Environment              Frequency  Min 1X/week        Progress Toward Goals  OT Goals(current goals can now be found in the care plan section)  Progress towards OT goals: Progressing toward goals  Acute Rehab OT Goals OT Goal Formulation: With patient Time For Goal Achievement: 10/30/22 Potential to Achieve Goals: Good  Plan      Co-evaluation      Reason for Co-Treatment: Necessary to address cognition/behavior during functional activity;To address functional/ADL transfers PT goals addressed during session: Mobility/safety with mobility        AM-PAC OT "6 Clicks" Daily Activity     Outcome Measure   Help from another person eating meals?: None Help from another person taking care of personal grooming?: A Little Help from another person toileting, which includes using toliet, bedpan, or urinal?: A Little Help from another person bathing (including washing, rinsing, drying)?: A Lot Help from another person to put on and taking off regular upper body clothing?: A Little Help from another person to put on and taking off regular lower body clothing?: A Lot 6 Click Score: 17    End of Session Equipment Utilized During Treatment: Gait belt;Rolling walker (2 wheels);Left knee immobilizer  OT Visit Diagnosis: Unsteadiness on feet (R26.81);Repeated falls (R29.6);Muscle weakness (generalized) (M62.81);History of falling (Z91.81)   Activity Tolerance Patient tolerated  treatment well   Patient Left in bed (going to test)   Nurse Communication Mobility status        Time: 3875-6433 OT Time Calculation (min): 82 min  Charges: OT General Charges $OT Visit: 1 Visit OT Treatments $Self Care/Home Management : 23-37 mins  Berna Spare, OTR/L Acute Rehabilitation Services Office: [phone]   Evern Bio 10/18/2022, 1:09 PM

## 2022-10-18 NOTE — Progress Notes (Signed)
Physical Therapy Treatment Patient Details Name: Brooke Weber MRN: [id number] DOB: 1940-12-26 Today's Date: 10/18/2022   History of Present Illness 82 y.o. female presents to [facility] ED 8/13 via EMS after syncopal episode while sitting in recliner chair. BP in field 70/40. BP in ED 163/78 Seen at [facility] ED 8/12 after syncopal event with fall resulting in blow to left forehead, L fractured radius and L minimally displaced transverse fracture of the inferior aspect of the patella as well as acute cystitis. [id number], hypertension, arthritis, anxiety, and GERD    PT Comments  Pt with knee brace in place on entry, reports it is heavy but is not bothering her. Pt unable to remember NWB through L UE needing constant reminding especially with scooting in bed. Pt is min A for getting out of bed but requires maxA for returning LE to bed with KI in place. Able to transfer and ambulate with contact guard assist. Pt limited in safe mobility by dementia and immobilization on L knee in presence of generalized weakness. Patient will benefit from continued inpatient follow up therapy, <3 hours/day. PT will continue to follow acutely.     If plan is discharge home, recommend the following: A little help with bathing/dressing/bathroom;A little help with walking and/or transfers;Assist for transportation;Help with stairs or ramp for entrance;Supervision due to cognitive status   Can travel by private vehicle     Yes  Equipment Recommendations  None recommended by PT    Recommendations for Other Services       Precautions / Restrictions Precautions Precautions: Fall Precaution Comments: syncopal falls Required Braces or Orthoses: Knee Immobilizer - Left;Sling Knee Immobilizer - Left: On at all times Restrictions Weight Bearing Restrictions: Yes LUE Weight Bearing: Weight bear through elbow only LLE Weight Bearing: Weight bearing as tolerated Other Position/Activity Restrictions: with KI in place with last  ED visit     Mobility  Bed Mobility Overal bed mobility: Needs Assistance Bed Mobility: Supine to Sit, Sit to Supine     Supine to sit: HOB elevated, Used rails, Min assist Sit to supine: Max assist   General bed mobility comments: maximal cuing for not using L UE for mobilization especially with scooting to EoB, cues for sequecing and min A for pad scoot to bring hips to EoB. pt able to get R LE back into bed but with KI in place requires maxA to return L LE to bed    Transfers Overall transfer level: Needs assistance Equipment used: Rolling walker (2 wheels) Transfers: Sit to/from Stand Sit to Stand: Min assist, From elevated surface           General transfer comment: vc for not using L UE to push up, placed L UE on platform of walker to reduce use, good power up min A for steadying in standing    Ambulation/Gait Ambulation/Gait assistance: Contact guard assist Gait Distance (Feet): 10 Feet (+25) Assistive device: Left platform walker Gait Pattern/deviations: Step-through pattern, Decreased weight shift to left Gait velocity: slowed Gait velocity interpretation: <1.31 ft/sec, indicative of household ambulator   General Gait Details: contact guard assist for safety and slight initial managment of platform walker, pt able to ambulate to bathroom and then out into hallway, pt able to manage L knee locked in extension with increased UE support for swing phase       Balance Overall balance assessment: Needs assistance Sitting-balance support: Feet supported, No upper extremity supported Sitting balance-[name]: Good     Standing balance support: During  functional activity, Bilateral upper extremity supported Standing balance-[name]: Poor                              Cognition Arousal: Alert Behavior During Therapy: WFL for tasks assessed/performed Overall Cognitive Status: History of cognitive impairments - at baseline Area of Impairment:  Orientation, Safety/judgement, Problem solving, Awareness, Memory                 Orientation Level: Disoriented to, Place, Time, Situation   Memory: Decreased short-term memory   Safety/Judgement: Decreased awareness of deficits   Problem Solving: Requires verbal cues, Requires tactile cues General Comments: can not remember that she is not supposed to use L wrist despite near constant cuing, short term memory impaired           General Comments General comments (skin integrity, edema, etc.): VSS on RA      Pertinent Vitals/Pain Pain Assessment Pain Assessment: Faces Faces Pain Scale: Hurts a little bit Pain Location: L knee and shoulder Pain Descriptors / Indicators: Aching, Sore, Throbbing Pain Intervention(s): Limited activity within patient's tolerance, Monitored during session, Repositioned     PT Goals (current goals can now be found in the care plan section) Acute Rehab PT Goals Patient Stated Goal: go home today PT Goal Formulation: With patient/family Time For Goal Achievement: 10/30/22 Potential to Achieve Goals: Fair Progress towards PT goals: Progressing toward goals    Frequency    Min 1X/week           Co-evaluation PT/OT/SLP Co-Evaluation/Treatment: Yes Reason for Co-Treatment: Necessary to address cognition/behavior during functional activity;To address functional/ADL transfers PT goals addressed during session: Mobility/safety with mobility        AM-PAC PT "6 Clicks" Mobility   Outcome Measure  Help needed turning from your back to your side while in a flat bed without using bedrails?: None Help needed moving from lying on your back to sitting on the side of a flat bed without using bedrails?: A Little Help needed moving to and from a bed to a chair (including a wheelchair)?: A Little Help needed standing up from a chair using your arms (e.g., wheelchair or bedside chair)?: A Little Help needed to walk in hospital room?: A  Lot Help needed climbing 3-5 steps with a railing? : A Lot 6 Click Score: 17    End of Session Equipment Utilized During Treatment: Gait belt Activity Tolerance: Patient tolerated treatment well Patient left: with family/visitor present;in bed;Other (comment) (transport present to take off floor for test) Nurse Communication: Mobility status PT Visit Diagnosis: Repeated falls (R29.6);History of falling (Z91.81);Difficulty in walking, not elsewhere classified (R26.2);Pain;Muscle weakness (generalized) (M62.81)     Time: 0935-1000 PT Time Calculation (min) (ACUTE ONLY): 25 min  Charges:    $Gait Training: 8-22 mins PT General Charges $$ ACUTE PT VISIT: 1 Visit                     Timotheus Salm B. Beverely Risen PT, DPT Acute Rehabilitation Services Please use secure chat or  Call Office [phone]    Elon Alas [facility] 10/18/2022, 12:45 PM

## 2022-10-18 NOTE — [place] FL2 (Signed)
[place] MEDICAID FL2 LEVEL OF CARE FORM     IDENTIFICATION  Patient Name: Brooke Weber Birthdate: Nov 09, 1940 Sex: female Admission Date (Current Location): 10/15/2022  [facility] and [place] Number:  Producer, television/film/video and Address:  [facility] [place] [facility], [address] [place], [place] [address]      Provider Number: [id number]  Attending Physician Name and Address:  Bobette Mo, MD  Relative Name and Phone Number:  Leone Payor [phone]    Current Level of Care: Hospital Recommended Level of Care: Skilled Nursing Facility Prior Approval Number:    Date Approved/Denied:   PASRR Number:    Discharge Plan: SNF    Current Diagnoses: Patient Active Problem List   Diagnosis Date Noted   Syncope 10/15/2022   Left medial tibial plateau fracture 10/15/2022   Fracture of radial neck, left, closed 10/15/2022   Fall at home, initial encounter 10/15/2022   UTI (urinary tract infection) 10/15/2022   Transient hypotension 10/15/2022   Essential hypertension 09/26/2021   Gastroesophageal reflux disease 09/26/2021   Mild cognitive impairment 09/26/2021   Hypokalemia 09/26/2021   Hyponatremia 09/26/2021   Chronic hyponatremia 09/25/2021   Chronic pain syndrome 03/20/2017   Long-term current use of opiate analgesic 03/20/2017    Orientation RESPIRATION BLADDER Height & Weight     Self, Situation, Place  O2 Continent Weight: 149 lb 14.6 oz (68 kg) Height:  5\' 5"  (165.1 cm)  BEHAVIORAL SYMPTOMS/MOOD NEUROLOGICAL BOWEL NUTRITION STATUS      Continent Diet (see discharge summary)  AMBULATORY STATUS COMMUNICATION OF NEEDS Skin   Limited Assist Verbally Normal                       Personal Care Assistance Level of Assistance  Bathing, Feeding, Dressing Bathing Assistance: Limited assistance Feeding assistance: Limited assistance Dressing Assistance: Limited assistance     Functional Limitations Info  Sight, Hearing, Speech  Sight Info: Adequate Hearing Info: Adequate Speech Info: Adequate    SPECIAL CARE FACTORS FREQUENCY  PT (By licensed PT), OT (By licensed OT)     PT Frequency: 5x week OT Frequency: 5x week            Contractures Contractures Info: Not present    Additional Factors Info  Code Status, Allergies Code Status Info: full Allergies Info: Aspirin, Bee Venom, Iodine, Other, Penicillins           Current Medications (10/18/2022):  This is the current hospital active medication list Current Facility-Administered Medications  Medication Dose Route Frequency Provider Last Rate Last Admin   acetaminophen (TYLENOL) tablet 650 mg  650 mg Oral Q6H PRN Clydie Braun, MD       Or   acetaminophen (TYLENOL) suppository 650 mg  650 mg Rectal Q6H PRN Madelyn Flavors A, MD       albuterol (PROVENTIL) (2.5 MG/3ML) 0.083% nebulizer solution 2.5 mg  2.5 mg Nebulization Q6H PRN Madelyn Flavors A, MD       ALPRAZolam Prudy Feeler) tablet 0.25 mg  0.25 mg Oral Daily PRN Madelyn Flavors A, MD   0.25 mg at 10/16/22 2005   atenolol (TENORMIN) tablet 100 mg  100 mg Oral QHS Azucena Fallen, MD   100 mg at 10/17/22 2211   atorvastatin (LIPITOR) tablet 20 mg  20 mg Oral QHS Smith, Rondell A, MD   20 mg at 10/17/22 2211   enoxaparin (LOVENOX) injection 40 mg  40 mg Subcutaneous Q24H Clydie Braun, MD  40 mg at 10/18/22 0900   lisinopril (ZESTRIL) tablet 20 mg  20 mg Oral Daily Azucena Fallen, MD   20 mg at 10/18/22 0859   oxyCODONE-acetaminophen (PERCOCET/ROXICET) 5-325 MG per tablet 1 tablet  1 tablet Oral Q6H PRN Kai Levins, RPH   1 tablet at 10/17/22 2212   And   oxyCODONE (Oxy IR/ROXICODONE) immediate release tablet 5 mg  5 mg Oral Q6H PRN Lodema Hong A, RPH   5 mg at 10/18/22 0859   pantoprazole (PROTONIX) EC tablet 40 mg  40 mg Oral Daily Smith, Rondell A, MD   40 mg at 10/18/22 0900   polyvinyl alcohol (LIQUIFILM TEARS) 1.4 % ophthalmic solution 2 drop  2 drop Both Eyes TID Lodema Hong A, RPH   2 drop at 10/18/22 0901   sodium chloride flush (NS) 0.9 % injection 3 mL  3 mL Intravenous Q12H Smith, Rondell A, MD   3 mL at 10/18/22 0903   traZODone (DESYREL) tablet 25-50 mg  25-50 mg Oral QHS PRN Clydie Braun, MD   50 mg at 10/16/22 2001     Discharge Medications: Please see discharge summary for a list of discharge medications.  Relevant Imaging Results:  Relevant Lab Results:   Additional Information SSN: [id number]  Lorri Frederick, LCSW

## 2022-10-18 NOTE — Progress Notes (Signed)
RE:  Brooke Weber       Date of Birth: 03/01/1941      Date:   10/18/22       To Whom It May Concern:  Please be advised that the above-named patient will require a short-term nursing home stay - anticipated 30 days or less for rehabilitation and strengthening.  The plan is for return home.                 MD signature                Date

## 2022-10-19 DIAGNOSIS — T148XXA Other injury of unspecified body region, initial encounter: Secondary | ICD-10-CM | POA: Diagnosis not present

## 2022-10-19 DIAGNOSIS — R55 Syncope and collapse: Secondary | ICD-10-CM | POA: Diagnosis not present

## 2022-10-19 LAB — COMPREHENSIVE METABOLIC PANEL
ALT: 19 U/L (ref 0–44)
AST: 20 U/L (ref 15–41)
Albumin: 3.1 g/dL — ABNORMAL LOW (ref 3.5–5.0)
Alkaline Phosphatase: 97 U/L (ref 38–126)
Anion gap: 10 (ref 5–15)
BUN: 11 mg/dL (ref 8–23)
CO2: 25 mmol/L (ref 22–32)
Calcium: 9 mg/dL (ref 8.9–10.3)
Chloride: 95 mmol/L — ABNORMAL LOW (ref 98–111)
Creatinine, Ser: 0.65 mg/dL (ref 0.44–1.00)
GFR, Estimated: >60 mL/min (ref >60)
Glucose, Bld: 111 mg/dL — ABNORMAL HIGH (ref 70–99)
Potassium: 3.6 mmol/L (ref 3.5–5.1)
Sodium: 130 mmol/L — ABNORMAL LOW (ref 135–145)
Total Bilirubin: 0.9 mg/dL (ref 0.3–1.2)
Total Protein: 5.7 g/dL — ABNORMAL LOW (ref 6.5–8.1)

## 2022-10-19 NOTE — Progress Notes (Signed)
  Progress Note   Patient: Brooke Weber [id number]:[id number] DOB: 19-Jul-1940 DOA: 10/15/2022     3 DOS: the patient was seen and examined on 10/19/2022   Brief hospital course: Brooke Weber is an 82 y.o. female past medical history of essential hypertension anxiety recently seen in the emergency room and was not for fall found to the ground face first, denied any loss of consciousness imaging showed no acute findings UA was possibly concerning for UTI was given antibiotics and discharged home with Keflex.  Since he got home he is relates he has not been able to urinate did not pick up his prescription, EMS was called when she passed out found to be hypotensive on arrival to the ED was found to have an elevated blood pressure sodium 129 potassium 3.2 chest x-ray showed no acute findings.  Assessment and Plan:    Syncope In the setting of recent:   UTI (urinary tract infection) Urine culture with multi species growth. Echo with Indeterminate diastolic function     Transient hypotension Resolved. Monitor blood pressure.    Essential hypertension Continue atenolol 100 mg      Fall at home, initial encounter with Left medial tibial plateau fracture and Fracture of radial neck, left, closed Continue immobilization. Continue analgesics as needed. Follow-up as an outpatient with orthopedic surgery.    Hypokalemia Resolved.    Hyponatremia   Chronic hyponatremia Improving. -trend    Chronic pain syndrome   Long-term current use of opiate analgesic Analgesics as needed.   Subjective: pleasant with no complaints  Physical Exam: Vitals:   10/18/22 1226 10/18/22 2003 10/19/22 0325 10/19/22 0812  BP: (!) 145/79 (!) 186/88 (!) 110/95 (!) 144/73  Pulse: (!) 54 79 63 (!) 58  Resp: 18 17 18    Temp: 98.1 F (36.7 C) 97.9 F (36.6 C) 98 F (36.7 C) 98.1 F (36.7 C)  TempSrc: Oral Oral Oral Oral  SpO2:  99% 100% 98%  Weight:      Height:         General: Appearance:    Well  developed, well nourished female in no acute distress     Lungs:     Clear to auscultation bilaterally, respirations unlabored  Heart:    Bradycardic. Normal rhythm. No murmurs, rubs, or gallops.   MS:   All extremities are intact.   Neurologic:   Awake, alert, oriented x 3. No apparent focal neurological           defect.        Family Communication:  Needs SNF  Time spent:  minutes  Author: Joseph Art, DO 10/19/2022 11:57 AM  For on call review [email or web address].

## 2022-10-20 DIAGNOSIS — R55 Syncope and collapse: Secondary | ICD-10-CM | POA: Diagnosis not present

## 2022-10-20 NOTE — Plan of Care (Signed)
  Problem: Clinical Measurements: Goal: Respiratory complications will improve Outcome: Completed/Met  Resp good on RA

## 2022-10-20 NOTE — Progress Notes (Signed)
  Progress Note   Patient: Brooke Weber [id number]:[id number] DOB: 12-Dec-1940 DOA: 10/15/2022     4 DOS: the patient was seen and examined on 10/20/2022   Brief hospital course: Brooke Weber is an 82 y.o. female past medical history of essential hypertension anxiety recently seen in the emergency room and was not for fall found to the ground face first, denied any loss of consciousness imaging showed no acute findings UA was possibly concerning for UTI was given antibiotics and discharged home with Keflex.  Since he got home he is relates he has not been able to urinate did not pick up his prescription, EMS was called when she passed out found to be hypotensive on arrival to the ED was found to have an elevated blood pressure sodium 129 potassium 3.2 chest x-ray showed no acute findings.  Assessment and Plan:    Syncope In the setting of recent:   UTI (urinary tract infection) Urine culture with multi species growth. Echo with Indeterminate diastolic function     Transient hypotension Resolved. Monitor blood pressure.    Essential hypertension Continue atenolol 100 mg      Fall at home, initial encounter with Left medial tibial plateau fracture and Fracture of radial neck, left, closed Continue immobilization. Continue analgesics as needed. Follow-up as an outpatient with orthopedic surgery.    Hypokalemia Resolved.    Hyponatremia   Chronic hyponatremia Improving. -trend    Chronic pain syndrome   Long-term current use of opiate analgesic Analgesics as needed.   Subjective: no overnight events  Physical Exam: Vitals:   10/19/22 1634 10/19/22 2123 10/20/22 0552 10/20/22 0757  BP: 115/67 (!) 154/63 (!) 141/62 (!) 141/62  Pulse: 64 70 (!) 54   Resp: 18 16 16 18   Temp: 98.4 F (36.9 C) 97.9 F (36.6 C) 98.1 F (36.7 C) 98 F (36.7 C)  TempSrc: Oral Oral Oral   SpO2: 98% 98% 98%   Weight:      Height:          General: Appearance:    elderly female in no  acute distress     Lungs:     Clear to auscultation bilaterally, respirations unlabored  Heart:    Bradycardic. Normal rhythm. No murmurs, rubs, or gallops.   MS:   All extremities are intact.   Neurologic:   Sleeping in chair      Family Communication:  Needs SNF  Time spent:  minutes  Author: Joseph Art, DO 10/20/2022 10:21 AM  For on call review [email or web address].

## 2022-10-21 DIAGNOSIS — R55 Syncope and collapse: Secondary | ICD-10-CM | POA: Diagnosis not present

## 2022-10-21 NOTE — Progress Notes (Signed)
Physical Therapy Treatment Patient Details Name: JAMAI FEST MRN: [id number] DOB: [birth year] Today's Date: 10/21/2022   History of Present Illness 82 y.o. female presents to [facility] ED 8/13 via EMS after syncopal episode while sitting in recliner chair. BP in field 70/40. BP in ED 163/78 Seen at [facility] ED 8/12 after syncopal event with fall resulting in blow to left forehead, L fractured radius and L minimally displaced transverse fracture of the inferior aspect of the patella as well as acute cystitis. WGN:[id number], hypertension, arthritis, anxiety, and GERD    PT Comments  Pt sitting up in recliner agreeable to walking with therapy. Continues to be limited in safe mobility by decreased memory of weightbearing restrictions especially with L UE, in presence of decreased endurance. Pt is min A for transfers with maximal cuing for non weightbearing through her L wrist. Once up pt requires contact guard for ambulation, with distance L LE fatigues. D/c plan remains appropriate. PT will continue to follow acutely.   If plan is discharge home, recommend the following: A little help with bathing/dressing/bathroom;A little help with walking and/or transfers;Assist for transportation;Help with stairs or ramp for entrance;Supervision due to cognitive status   Can travel by private vehicle     Yes  Equipment Recommendations  None recommended by PT       Precautions / Restrictions Precautions Precautions: Fall Precaution Comments: syncopal falls Required Braces or Orthoses: Knee Immobilizer - Left;Sling Knee Immobilizer - Left: On at all times Restrictions Weight Bearing Restrictions: Yes LUE Weight Bearing: Weight bear through elbow only LLE Weight Bearing: Weight bearing as tolerated     Mobility     Transfers Overall transfer level: Needs assistance Equipment used: Rolling walker (2 wheels) Transfers: Sit to/from Stand Sit to Stand: Min assist           General transfer comment: min A  for pt to not push through her L UE, needs assistance for management into platform    Ambulation/Gait Ambulation/Gait assistance: Contact guard assist Gait Distance (Feet): 45 Feet Assistive device: Left platform walker Gait Pattern/deviations: Step-through pattern, Decreased weight shift to left Gait velocity: slowed Gait velocity interpretation: <1.31 ft/sec, indicative of household ambulator   General Gait Details: contact guard for safety, improved stability with ambulation fatigues easily         Balance Overall balance assessment: Needs assistance Sitting-balance support: Feet supported, No upper extremity supported Sitting balance-[name]: Good     Standing balance support: During functional activity, Bilateral upper extremity supported Standing balance-[name]: Poor                              Cognition Arousal: Alert Behavior During Therapy: WFL for tasks assessed/performed Overall Cognitive Status: History of cognitive impairments - at baseline                                 General Comments: continues to need increased cuing for maintaining NWB through L wrist           General Comments General comments (skin integrity, edema, etc.): VSS on RA      Pertinent Vitals/Pain Pain Assessment Pain Assessment: Faces Faces Pain Scale: Hurts a little bit Pain Location: L knee Pain Descriptors / Indicators: Aching, Sore, Throbbing     PT Goals (current goals can now be found in the care plan section) Acute Rehab PT Goals Patient  Stated Goal: go home today PT Goal Formulation: With patient/family Time For Goal Achievement: 10/30/22 Potential to Achieve Goals: Fair Progress towards PT goals: Progressing toward goals    Frequency    Min 1X/week       AM-PAC PT "6 Clicks" Mobility   Outcome Measure  Help needed turning from your back to your side while in a flat bed without using bedrails?: None Help needed moving  from lying on your back to sitting on the side of a flat bed without using bedrails?: A Little Help needed moving to and from a bed to a chair (including a wheelchair)?: A Little Help needed standing up from a chair using your arms (e.g., wheelchair or bedside chair)?: A Little Help needed to walk in hospital room?: A Little Help needed climbing 3-5 steps with a railing? : A Lot 6 Click Score: 18    End of Session Equipment Utilized During Treatment: Gait belt Activity Tolerance: Patient tolerated treatment well Patient left: in chair;with call bell/phone within reach;with chair alarm set Nurse Communication: Mobility status PT Visit Diagnosis: Repeated falls (R29.6);History of falling (Z91.81);Difficulty in walking, not elsewhere classified (R26.2);Pain;Muscle weakness (generalized) (M62.81)     Time: 1610-9604 PT Time Calculation (min) (ACUTE ONLY): 14 min  Charges:    $Gait Training: 8-22 mins PT General Charges $$ ACUTE PT VISIT: 1 Visit                     Sharmin Foulk B. Beverely Risen PT, DPT Acute Rehabilitation Services Please use secure chat or  Call Office [phone]    [facility]on Alas [facility] 10/21/2022, 3:41 PM

## 2022-10-21 NOTE — Care Management Important Message (Signed)
Important Message  Patient Details  Name: Brooke Weber MRN: [id number] Date of Birth: 14-Aug-1940   Medicare Important Message Given:  Yes     Sherilyn Banker 10/21/2022, 3:04 PM

## 2022-10-21 NOTE — Progress Notes (Signed)
Occupational Therapy Treatment Patient Details Name: Brooke Weber MRN: [id number] DOB: Sep 20, 1940 Today's Date: 10/21/2022   History of present illness 82 y.o. female presents to [facility] ED 8/13 via EMS after syncopal episode while sitting in recliner chair. BP in field 70/40. BP in ED 163/78 Seen at [facility] ED 8/12 after syncopal event with fall resulting in blow to left forehead, L fractured radius and L minimally displaced transverse fracture of the inferior aspect of the patella as well as acute cystitis. ION:[id number], hypertension, arthritis, anxiety, and GERD   OT comments  Pt progressing steadily. Supine to sit with supervision and verbal cues to avoid pushing through hand. Stood from bed with CGA and ambulated to sink in bathroom for 3 grooming activities in standing. Left up in chair with husband present. Continue to recommend post acute rehab < 3 hours a day.       If plan is discharge home, recommend the following:  A little help with walking and/or transfers;Direct supervision/assist for medications management;Direct supervision/assist for financial management;Assist for transportation;Help with stairs or ramp for entrance;Supervision due to cognitive status;A little help with bathing/dressing/bathroom   Equipment Recommendations  BSC/3in1    Recommendations for Other Services      Precautions / Restrictions Precautions Precautions: Fall Required Braces or Orthoses: Knee Immobilizer - Left (locked in extension) Knee Immobilizer - Left: On at all times Restrictions Weight Bearing Restrictions: Yes LUE Weight Bearing: Weight bear through elbow only LLE Weight Bearing: Weight bearing as tolerated       Mobility Bed Mobility Overal bed mobility: Needs Assistance Bed Mobility: Supine to Sit     Supine to sit: Supervision     General bed mobility comments: cues to avoid pushing through L hand    Transfers Overall transfer level: Needs assistance Equipment used: Rolling  walker (2 wheels) Transfers: Sit to/from Stand Sit to Stand: Contact guard assist           General transfer comment: CGA from bed     Balance Overall balance assessment: Needs assistance   Sitting balance-[name]: Good     Standing balance support: During functional activity, Bilateral upper extremity supported Standing balance-[name]: Poor Standing balance comment: can release walker in static standing at sink, RW dependent for ambulation                           ADL either performed or assessed with clinical judgement   ADL Overall ADL's : Needs assistance/impaired     Grooming: Wash/dry hands;Wash/dry face;Brushing hair;Standing;Contact guard assist           Upper Body Dressing : Sitting;Set up                   Functional mobility during ADLs: Contact guard assist;Rolling walker (2 wheels)      Extremity/Trunk Assessment              Vision       Perception     Praxis      Cognition Arousal: Alert Behavior During Therapy: WFL for tasks assessed/performed Overall Cognitive Status: History of cognitive impairments - at baseline                                 General Comments: pt needing multiple cues to avoid pushing through L wrist        Exercises  Shoulder Instructions       General Comments      Pertinent Vitals/ Pain       Pain Assessment Pain Assessment: Faces Faces Pain Scale: Hurts a little bit Pain Location: L knee Pain Descriptors / Indicators: Discomfort Pain Intervention(s): Monitored during session, Repositioned  Home Living                                          Prior Functioning/Environment              Frequency  Min 1X/week        Progress Toward Goals  OT Goals(current goals can now be found in the care plan section)  Progress towards OT goals: Progressing toward goals  Acute Rehab OT Goals OT Goal Formulation: With  patient Time For Goal Achievement: 10/30/22 Potential to Achieve Goals: Good  Plan      Co-evaluation                 AM-PAC OT "6 Clicks" Daily Activity     Outcome Measure   Help from another person eating meals?: None Help from another person taking care of personal grooming?: A Little Help from another person toileting, which includes using toliet, bedpan, or urinal?: A Little Help from another person bathing (including washing, rinsing, drying)?: A Lot Help from another person to put on and taking off regular upper body clothing?: A Little Help from another person to put on and taking off regular lower body clothing?: A Lot 6 Click Score: 17    End of Session Equipment Utilized During Treatment: Gait belt;Rolling walker (2 wheels);Left knee immobilizer  OT Visit Diagnosis: Unsteadiness on feet (R26.81);Repeated falls (R29.6);Muscle weakness (generalized) (M62.81);History of falling (Z91.81)   Activity Tolerance Patient tolerated treatment well   Patient Left in chair;with call bell/phone within reach;with chair alarm set;with family/visitor present   Nurse Communication          Time: [phone] OT Time Calculation (min): 24 min  Charges: OT General Charges $OT Visit: 1 Visit OT Treatments $Self Care/Home Management : 23-37 mins  Berna Spare, OTR/L Acute Rehabilitation Services Office: [phone]   Evern Bio 10/21/2022, 10:07 AM

## 2022-10-21 NOTE — Plan of Care (Signed)
  Problem: Clinical Measurements: Goal: Ability to maintain clinical measurements within normal limits will improve Outcome: Progressing Goal: Will remain free from infection Outcome: Progressing Goal: Diagnostic test results will improve Outcome: Progressing Goal: Cardiovascular complication will be avoided Outcome: Progressing   Problem: Activity: Goal: Risk for activity intolerance will decrease Outcome: Progressing   Problem: Nutrition: Goal: Adequate nutrition will be maintained Outcome: Progressing   Problem: Coping: Goal: Level of anxiety will decrease Outcome: Progressing   Problem: Elimination: Goal: Will not experience complications related to bowel motility Outcome: Progressing Goal: Will not experience complications related to urinary retention Outcome: Progressing

## 2022-10-21 NOTE — Progress Notes (Signed)
PROGRESS NOTE    Brooke Weber  XBJ:[id number] DOB: Jul 20, 1940 DOA: 10/15/2022  PCP: Daisy Floro, MD   Brief Narrative:  This 82 y.o. female with past medical history of essential hypertension, anxiety recently seen in the emergency room s/p  fall found on the ground face first, denied any loss of consciousness imaging showed no acute findings,  UA was possibly concerning for UTI and was given IV antibiotics and discharged home with Keflex. Since he got home,  he relates he has not been able to urinate, She did not pick up his prescription, EMS was called when she passed out found to be hypotensive on arrival to the ED, She was found to have an elevated blood pressure, sodium 129, potassium 3.2, chest x-ray showed no acute findings.    Assessment & Plan:   Principal Problem:   Syncope Active Problems:   Transient hypotension   Left medial tibial plateau fracture   Fracture of radial neck, left, closed   Fall at home, initial encounter   UTI (urinary tract infection)   Hypokalemia   Hyponatremia   Chronic hyponatremia   Chronic pain syndrome   Essential hypertension   Long-term current use of opiate analgesic  Syncope: Patient presented with syncopal episode likely in the setting of recent UTI. Urine culture grew multiple species. Echocardiogram within determine diastolic function. CT head no acute intracranial abnormality.  Soft tissue hematoma in the left frontal scalp no evidence of calvarial fracture Completed course of antibiotics.   Transient hypotension Resolved. Monitor blood pressure.   Essential hypertension Continue atenolol 100 mg      Fall at home, initial encounter with Left medial tibial plateau fracture and Fracture of radial neck, left, closed Continue immobilization. Continue analgesics as needed. Follow-up as an outpatient with orthopedic surgery.   Hypokalemia Replaced. Resolved.   Hyponatremia: Chronic  hyponatremia Improving. Continue to trend serum sodium   Chronic pain syndrome Long-term current use of opiate analgesics. Analgesics as needed.   DVT prophylaxis: Lovenox Code Status: Full code. Family Communication:No family at bed side. Disposition Plan:   Status is: Inpatient Remains inpatient appropriate because: Admitted with syncopal episode likely in the setting of UTI.    Consultants:  None  Procedures: None  Antimicrobials:  Anti-infectives (From admission, onward)    Start     Dose/Rate Route Frequency Ordered Stop   10/15/22 1130  cefTRIAXone (ROCEPHIN) 1 g in sodium chloride 0.9 % 100 mL IVPB  Status:  Discontinued        1 g 200 mL/hr over 30 Minutes Intravenous Every 24 hours 10/15/22 1030 10/17/22 1159      Subjective: Patient was seen and examined at bedside.  Overnight events noted. Patient reports doing better.  She was sitting comfortably on the chair.  Objective: Vitals:   10/20/22 1955 10/21/22 0505 10/21/22 0732 10/21/22 1504  BP: (!) 150/67 (!) 162/87 (!) 141/68 (!) 155/56  Pulse: 68 62 (!) 53 (!) 52  Resp: 18 18 15 16   Temp: 98.2 F (36.8 C) 97.8 F (36.6 C) 98.2 F (36.8 C) 98.1 F (36.7 C)  TempSrc: Oral Oral  Oral  SpO2: 98% 100% 98% 100%  Weight:      Height:        Intake/Output Summary (Last 24 hours) at 10/21/2022 1619 Last data filed at 10/21/2022 1500 Gross per 24 hour  Intake 240 ml  Output --  Net 240 ml   Filed Weights   10/15/22 0503  Weight: 68 kg  Examination:  General exam: Appears calm and comfortable, deconditioned, bruise over the left eye noted. Respiratory system: Clear to auscultation. Respiratory effort normal.  RR 15 Cardiovascular system: S1 & S2 heard, regular rate and rhythm, no murmur.  No pedal edema. Gastrointestinal system: Abdomen is nondistended, soft and nontender.  Normal bowel sounds heard. Central nervous system: Alert and oriented x 3. No focal neurological deficits. Extremities: No  edema, no cyanosis, no clubbing. Skin: No rashes, lesions or ulcers Psychiatry: Judgement and insight appear normal. Mood & affect appropriate.     Data Reviewed: I have personally reviewed following labs and imaging studies  CBC: Recent Labs  Lab 10/14/22 1712 10/15/22 0538 10/16/22 0140  WBC 11.6* 10.4 9.5  NEUTROABS 9.9* 9.0*  --   HGB 13.2 12.2 14.1  HCT 39.1 35.9* 40.1  MCV 93.8 92.1 91.8  PLT 232 213 272   Basic Metabolic Panel: Recent Labs  Lab 10/14/22 1712 10/15/22 0538 10/16/22 0140 10/17/22 0628 10/19/22 0140  NA 130* 129* 129* 129* 130*  K 3.4* 3.2* 3.2* 4.0 3.6  CL 95* 93* 91* 96* 95*  CO2 20* 24 25 23 25   GLUCOSE 99 146* 117* 103* 111*  BUN 11 10 7* 8 11  CREATININE 0.67 0.79 0.69 0.64 0.65  CALCIUM 8.9 8.1* 9.3 9.2 9.0   GFR: Estimated Creatinine Clearance: 48.8 mL/min (by C-G formula based on SCr of 0.65 mg/dL). Liver Function Tests: Recent Labs  Lab 10/19/22 0140  AST 20  ALT 19  ALKPHOS 97  BILITOT 0.9  PROT 5.7*  ALBUMIN 3.1*   No results for input(s): "LIPASE", "AMYLASE" in the last 168 hours. No results for input(s): "AMMONIA" in the last 168 hours. Coagulation Profile: No results for input(s): "INR", "PROTIME" in the last 168 hours. Cardiac Enzymes: No results for input(s): "CKTOTAL", "CKMB", "CKMBINDEX", "TROPONINI" in the last 168 hours. BNP (last 3 results) No results for input(s): "PROBNP" in the last 8760 hours. HbA1C: No results for input(s): "HGBA1C" in the last 72 hours. CBG: Recent Labs  Lab 10/14/22 1625 10/15/22 0540  GLUCAP 113* 147*   Lipid Profile: No results for input(s): "CHOL", "HDL", "LDLCALC", "TRIG", "CHOLHDL", "LDLDIRECT" in the last 72 hours. Thyroid Function Tests: No results for input(s): "TSH", "T4TOTAL", "FREET4", "T3FREE", "THYROIDAB" in the last 72 hours. Anemia Panel: No results for input(s): "VITAMINB12", "FOLATE", "FERRITIN", "TIBC", "IRON", "RETICCTPCT" in the last 72 hours. Sepsis  Labs: No results for input(s): "PROCALCITON", "LATICACIDVEN" in the last 168 hours.  Recent Results (from the past 240 hour(s))  Urine Culture     Status: Abnormal   Collection Time: 10/14/22  4:37 PM   Specimen: Urine, Clean Catch  Result Value Ref Range Status   Specimen Description   Final    URINE, CLEAN CATCH Performed at [facility], [address] [place], [place] [address]    Special Requests   Final    NONE Performed at [facility], [address], [place], [place] [address]    Culture >=100,000 COLONIES/mL ESCHERICHIA COLI (A)  Final   Report Status 10/17/2022 FINAL  Final   Organism ID, Bacteria ESCHERICHIA COLI (A)  Final      Susceptibility   Escherichia coli - MIC*    AMPICILLIN <=2 SENSITIVE Sensitive     CEFAZOLIN <=4 SENSITIVE Sensitive     CEFEPIME <=0.12 SENSITIVE Sensitive     CEFTRIAXONE <=0.25 SENSITIVE Sensitive     CIPROFLOXACIN <=0.25 SENSITIVE Sensitive     GENTAMICIN <=1 SENSITIVE  Sensitive     IMIPENEM <=0.25 SENSITIVE Sensitive     NITROFURANTOIN <=16 SENSITIVE Sensitive     TRIMETH/SULFA <=20 SENSITIVE Sensitive     AMPICILLIN/SULBACTAM <=2 SENSITIVE Sensitive     PIP/TAZO <=4 SENSITIVE Sensitive     * >=100,000 COLONIES/mL ESCHERICHIA COLI  Urine Culture (for pregnant, neutropenic or urologic patients or patients with an indwelling urinary catheter)     Status: Abnormal   Collection Time: 10/15/22  7:23 AM   Specimen: Urine, Clean Catch  Result Value Ref Range Status   Specimen Description URINE, CLEAN CATCH  Final   Special Requests   Final    NONE Performed at [facility], [address], [place], [place] [address]    Culture MULTIPLE SPECIES PRESENT, SUGGEST RECOLLECTION (A)  Final   Report Status 10/16/2022 FINAL  Final    Radiology Studies: No results found.  Scheduled Meds:  atenolol  100 mg Oral QHS   atorvastatin  20 mg Oral QHS   enoxaparin (LOVENOX) injection  40 mg Subcutaneous  Q24H   lisinopril  20 mg Oral Daily   pantoprazole  40 mg Oral Daily   polyvinyl alcohol  2 drop Both Eyes TID   sodium chloride flush  3 mL Intravenous Q12H   Continuous Infusions:   LOS: 5 days    Time spent: 50 mins    Willeen Niece, MD [facility]   If 7PM-7AM, please contact night-coverage

## 2022-10-21 NOTE — TOC Progression Note (Addendum)
Transition of Care [facility]) - Progression Note    Patient Details  Name: Brooke Weber MRN: [id number] Date of Birth: [birth year]  [facility] CM/SW Contact  Lorri Frederick, LCSW Phone Number: 10/21/2022, 9:31 AM  Clinical Narrative:   PASSR received: [id number].  CSW confirmed with [facility] that they can receive pt today.  CSW spoke with pt and husband Brooke Weber and confirmed they accept offer at [facility].  MD informed.  Medicare payer: inpt order 10/16/22.      Expected Discharge Plan: Skilled Nursing Facility Barriers to Discharge: Continued Medical Work up, SNF Pending bed offer  Expected Discharge Plan and Services In-house Referral: Clinical Social Work   Post Acute Care Choice: Skilled Nursing Facility Living arrangements for the past 2 months: Single Family Home                                       Social Determinants of Health (SDOH) Interventions SDOH Screenings   Food Insecurity: No Food Insecurity (10/15/2022)  Housing: Low Risk  (10/15/2022)  Transportation Needs: No Transportation Needs (10/15/2022)  Utilities: Not At Risk (10/15/2022)  Financial Resource Strain: Low Risk  (02/16/2020)   Received from [facility] visits prior to 05/04/2022., [facility] visits prior to 05/04/2022.  Social Connections: Unknown (02/16/2020)   Received from [facility] visits prior to 05/04/2022., [facility] visits prior to 05/04/2022.  Tobacco Use: Low Risk  (10/15/2022)    Readmission Risk Interventions     No data to display

## 2022-10-22 DIAGNOSIS — N390 Urinary tract infection, site not specified: Secondary | ICD-10-CM | POA: Diagnosis not present

## 2022-10-22 DIAGNOSIS — I10 Essential (primary) hypertension: Secondary | ICD-10-CM | POA: Diagnosis not present

## 2022-10-22 DIAGNOSIS — F5101 Primary insomnia: Secondary | ICD-10-CM | POA: Diagnosis not present

## 2022-10-22 DIAGNOSIS — R55 Syncope and collapse: Secondary | ICD-10-CM | POA: Diagnosis not present

## 2022-10-22 DIAGNOSIS — S52132D Displaced fracture of neck of left radius, subsequent encounter for closed fracture with routine healing: Secondary | ICD-10-CM | POA: Diagnosis not present

## 2022-10-22 DIAGNOSIS — W19XXXD Unspecified fall, subsequent encounter: Secondary | ICD-10-CM | POA: Diagnosis not present

## 2022-10-22 DIAGNOSIS — S82132D Displaced fracture of medial condyle of left tibia, subsequent encounter for closed fracture with routine healing: Secondary | ICD-10-CM | POA: Diagnosis not present

## 2022-10-22 DIAGNOSIS — H04123 Dry eye syndrome of bilateral lacrimal glands: Secondary | ICD-10-CM | POA: Diagnosis not present

## 2022-10-22 DIAGNOSIS — E871 Hypo-osmolality and hyponatremia: Secondary | ICD-10-CM | POA: Diagnosis not present

## 2022-10-22 DIAGNOSIS — I959 Hypotension, unspecified: Secondary | ICD-10-CM | POA: Diagnosis not present

## 2022-10-22 DIAGNOSIS — M1990 Unspecified osteoarthritis, unspecified site: Secondary | ICD-10-CM | POA: Diagnosis not present

## 2022-10-22 DIAGNOSIS — K625 Hemorrhage of anus and rectum: Secondary | ICD-10-CM | POA: Diagnosis not present

## 2022-10-22 DIAGNOSIS — M25562 Pain in left knee: Secondary | ICD-10-CM | POA: Diagnosis not present

## 2022-10-22 DIAGNOSIS — F03B4 Unspecified dementia, moderate, with anxiety: Secondary | ICD-10-CM | POA: Diagnosis not present

## 2022-10-22 DIAGNOSIS — K5900 Constipation, unspecified: Secondary | ICD-10-CM | POA: Diagnosis not present

## 2022-10-22 DIAGNOSIS — E876 Hypokalemia: Secondary | ICD-10-CM | POA: Diagnosis not present

## 2022-10-22 DIAGNOSIS — M25522 Pain in left elbow: Secondary | ICD-10-CM | POA: Diagnosis not present

## 2022-10-22 DIAGNOSIS — F0390 Unspecified dementia without behavioral disturbance: Secondary | ICD-10-CM | POA: Diagnosis not present

## 2022-10-22 DIAGNOSIS — F419 Anxiety disorder, unspecified: Secondary | ICD-10-CM | POA: Diagnosis not present

## 2022-10-22 DIAGNOSIS — F05 Delirium due to known physiological condition: Secondary | ICD-10-CM | POA: Diagnosis not present

## 2022-10-22 DIAGNOSIS — Z79891 Long term (current) use of opiate analgesic: Secondary | ICD-10-CM | POA: Diagnosis not present

## 2022-10-22 DIAGNOSIS — K219 Gastro-esophageal reflux disease without esophagitis: Secondary | ICD-10-CM | POA: Diagnosis not present

## 2022-10-22 DIAGNOSIS — G894 Chronic pain syndrome: Secondary | ICD-10-CM | POA: Diagnosis not present

## 2022-10-22 LAB — BASIC METABOLIC PANEL
Anion gap: 7 (ref 5–15)
BUN: 9 mg/dL (ref 8–23)
CO2: 25 mmol/L (ref 22–32)
Calcium: 8.5 mg/dL — ABNORMAL LOW (ref 8.9–10.3)
Chloride: 97 mmol/L — ABNORMAL LOW (ref 98–111)
Creatinine, Ser: 0.62 mg/dL (ref 0.44–1.00)
GFR, Estimated: >60 mL/min (ref >60)
Glucose, Bld: 97 mg/dL (ref 70–99)
Potassium: 4.4 mmol/L (ref 3.5–5.1)
Sodium: 129 mmol/L — ABNORMAL LOW (ref 135–145)

## 2022-10-22 LAB — CBC
HCT: 34.7 % — ABNORMAL LOW (ref 36.0–46.0)
Hemoglobin: 12 g/dL (ref 12.0–15.0)
MCH: 31.7 pg (ref 26.0–34.0)
MCHC: 34.6 g/dL (ref 30.0–36.0)
MCV: 91.8 fL (ref 80.0–100.0)
Platelets: 298 10*3/uL (ref 150–400)
RBC: 3.78 MIL/uL — ABNORMAL LOW (ref 3.87–5.11)
RDW: 12.7 % (ref 11.5–15.5)
WBC: 7.7 10*3/uL (ref 4.0–10.5)
nRBC: 0 % (ref 0.0–0.2)

## 2022-10-22 LAB — PHOSPHORUS: Phosphorus: 3 mg/dL (ref 2.5–4.6)

## 2022-10-22 LAB — MAGNESIUM: Magnesium: 1.7 mg/dL (ref 1.7–2.4)

## 2022-10-22 MED ORDER — ALPRAZOLAM 0.5 MG PO TABS: 0.2500 mg | ORAL_TABLET | Freq: Every day | ORAL | 0 refills | Status: AC | PRN

## 2022-10-22 MED ORDER — ATENOLOL 50 MG PO TABS: 50.0000 mg | ORAL_TABLET | Freq: Every day | ORAL | 1 refills | Status: AC

## 2022-10-22 MED ORDER — OXYCODONE-ACETAMINOPHEN 10-325 MG PO TABS: 1.0000 | ORAL_TABLET | Freq: Four times a day (QID) | ORAL | 0 refills | Status: AC | PRN

## 2022-10-22 NOTE — TOC Progression Note (Signed)
Transition of Care [facility]) - Progression Note    Patient Details  Name: Brooke Weber MRN: [id number] Date of Birth: 06/06/1940  Transition of Care [facility]) CM/SW Contact  Lorri Frederick, LCSW Phone Number: 10/22/2022, 1:16 PM  Clinical Narrative:   CSW confirmed with [facility] that she can receive pt today.  CSW spoke with husband Earvin Hansen regarding transportation.  He can transport but does want assistance with getting pt into and out of the vehicle at the hospital and at [facility]  [facility] aware.      Expected Discharge Plan: Skilled Nursing Facility Barriers to Discharge: Barriers Resolved  Expected Discharge Plan and Services In-house Referral: Clinical Social Work   Post Acute Care Choice: Skilled Nursing Facility Living arrangements for the past 2 months: Single Family Home Expected Discharge Date: 10/22/22                                     Social Determinants of Health (SDOH) Interventions SDOH Screenings   Food Insecurity: No Food Insecurity (10/15/2022)  Housing: Low Risk  (10/15/2022)  Transportation Needs: No Transportation Needs (10/15/2022)  Utilities: Not At Risk (10/15/2022)  Financial Resource Strain: Low Risk  (02/16/2020)   [facility] from [facility] visits prior to 05/04/2022., [facility] visits prior to 05/04/2022.  Social Connections: Unknown (02/16/2020)   Received from [facility] visits prior to 05/04/2022., [facility] visits prior to 05/04/2022.  Tobacco Use: Low Risk  (10/15/2022)    Readmission Risk Interventions     No data to display

## 2022-10-22 NOTE — Progress Notes (Signed)
Pt report given to [facility]; receiving RN for [facility].Pt is going to room 3.

## 2022-10-22 NOTE — Discharge Summary (Signed)
Physician Discharge Summary  Brooke Weber:[id number] DOB: [birth year] DOA: 10/15/2022  PCP: Daisy Floro, MD  Admit date: 10/15/2022  Discharge date: 10/22/2022  Admitted From: Home.  Disposition: Skilled nursing Home  Recommendations for Outpatient Follow-up:  Follow up with PCP in 1-2 weeks. Please obtain BMP/CBC in one week. Advised to reduce metoprolol to 50 mg daily due to bradycardia and hypotension. Resume lisinipril if BP improves.  Home Health: None Equipment/Devices:None  Discharge Condition: Stable CODE STATUS:Full code Diet recommendation: Heart Healthy   Brief Summary/Hospital Course: This 82 y.o. female with past medical significant for history of essential hypertension, anxiety recently seen in the emergency room s/p  fall found on the ground face first, denied any loss of consciousness,  imaging showed no acute findings,  UA was possibly concerning for UTI and was given IV antibiotics and discharged home with Keflex. Since she got home,  she relates she has not been able to urinate, She did not pick up her prescription, EMS was called when she passed out and found to be hypotensive on arrival to the ED, She was found to have an elevated blood pressure, sodium 129, potassium 3.2, chest x-ray showed no acute findings.Patient was admitted for further evaluation.  Workup for syncope so far unremarkable.  Echo showed LVEF 60 to 65%, transient hypotension has resolved.  Blood pressure and heart rate remains low.  Blood pressure medication metoprolol was reduced to 50 mg daily . PT and OT recommended skilled nursing facility.  Patient will follow-up with orthopedics as an outpatient.  Patient is being discharged to SNF.   Discharge Diagnoses:  Principal Problem:   Syncope Active Problems:   Transient hypotension   Left medial tibial plateau fracture   Fracture of radial neck, left, closed   Fall at home, initial encounter   UTI (urinary tract infection)    Hypokalemia   Hyponatremia   Chronic hyponatremia   Chronic pain syndrome   Essential hypertension   Long-term current use of opiate analgesic  Syncope: Patient presented with syncopal episode likely in the setting of recent UTI. Urine culture grew multiple species. Echocardiogram showed LVEF 60-65%, with indetermine diastolic function. CT head no acute intracranial abnormality.  Soft tissue hematoma in the left frontal scalp,  no evidence of calvarial fracture Completed course of antibiotics.   Transient hypotension Resolved. Monitor blood pressure.   Essential hypertension Atenolol dose was reduced to 50 mg daily.     Fall at home, initial encounter with Left medial tibial plateau fracture and Fracture of radial neck, left, closed Continue immobilization. Continue analgesics as needed. Follow-up as an outpatient with orthopedic surgery.   Hypokalemia Replaced. Resolved.   Hyponatremia: Chronic hyponatremia Improving. Continue to trend serum sodium   Chronic pain syndrome Long-term current use of opiate analgesics. Analgesics as needed.  Discharge Instructions  Discharge Instructions     Call MD for:  difficulty breathing, headache or visual disturbances   Complete by: As directed    Call MD for:  persistant dizziness or light-headedness   Complete by: As directed    Call MD for:  persistant nausea and vomiting   Complete by: As directed    Diet - low sodium heart healthy   Complete by: As directed    Diet Carb Modified   Complete by: As directed    Discharge instructions   Complete by: As directed    Advised to follow up PCP in one week. Advised to reduce metoprolol to 50 mg daily due  to bradycardia and hypotension. Resume lisinipril if BP improves.   Increase activity slowly   Complete by: As directed       Allergies as of 10/22/2022       Reactions   Aspirin Anaphylaxis   Bee Venom Anaphylaxis   Iodine Anaphylaxis   Other Anaphylaxis   nuts    Penicillins Anaphylaxis   Has patient had a PCN reaction causing immediate rash, facial/tongue/throat swelling, SOB or lightheadedness with hypotension: No Has patient had a PCN reaction causing severe rash involving mucus membranes or skin necrosis: No Has patient had a PCN reaction that required hospitalization: No Has patient had a PCN reaction occurring within the last 10 years: No If all of the above answers are "NO", then may proceed with Cephalosporin use.        Medication List     STOP taking these medications    lisinopril 20 MG tablet Commonly known as: ZESTRIL       TAKE these medications    acetaminophen 325 MG tablet Commonly known as: TYLENOL Take 650 mg by mouth every 6 (six) hours as needed for mild pain.   ALPRAZolam 0.5 MG tablet Commonly known as: XANAX Take 0.5 tablets (0.25 mg total) by mouth daily as needed for up to 7 days for anxiety.   atenolol 50 MG tablet Commonly known as: TENORMIN Take 1 tablet (50 mg total) by mouth at bedtime. What changed:  medication strength how much to take   atorvastatin 20 MG tablet Commonly known as: LIPITOR Take 20 mg by mouth at bedtime.   Narcan 4 MG/0.1ML Liqd nasal spray kit Generic drug: naloxone Place 1 spray into the nose once.   omeprazole 20 MG capsule Commonly known as: PRILOSEC Take 20 mg by mouth daily.   oxyCODONE-acetaminophen 10-325 MG tablet Commonly known as: PERCOCET Take 1 tablet by mouth every 6 (six) hours as needed for up to 3 days for pain.   polyethylene glycol 17 g packet Commonly known as: MIRALAX / GLYCOLAX Take 17 g by mouth daily.   Systane 0.4-0.3 % Soln Generic drug: Polyethyl Glycol-Propyl Glycol Place 2 drops into both eyes 3 (three) times daily.   traZODone 50 MG tablet Commonly known as: DESYREL Take 25-50 mg by mouth at bedtime as needed.        Follow-up Information     Daisy Floro, MD Follow up in 1 week(s).   Specialty: Family Medicine Contact  information: [address] [place] [address] [phone]                Allergies  Allergen Reactions   Aspirin Anaphylaxis   Bee Venom Anaphylaxis   Iodine Anaphylaxis   Other Anaphylaxis    nuts   Penicillins Anaphylaxis    Has patient had a PCN reaction causing immediate rash, facial/tongue/throat swelling, SOB or lightheadedness with hypotension: No Has patient had a PCN reaction causing severe rash involving mucus membranes or skin necrosis: No Has patient had a PCN reaction that required hospitalization: No Has patient had a PCN reaction occurring within the last 10 years: No If all of the above answers are "NO", then may proceed with Cephalosporin use.    Consultations: None   Procedures/Studies: NM Pulmonary Perfusion  Result Date: 10/18/2022 CLINICAL DATA:  Pulmonary embolism (PE) suspected, high prob, syncope EXAM: NUCLEAR MEDICINE PERFUSION LUNG SCAN TECHNIQUE: Perfusion images were obtained in multiple projections after intravenous injection of radiopharmaceutical. Ventilation scans intentionally deferred if perfusion scan and chest  x-ray adequate for interpretation during COVID 19 epidemic. RADIOPHARMACEUTICALS:  4.3 mCi Tc-49m MAA IV COMPARISON:  Portable chest radiograph of the same day, and previous FINDINGS: Mildly heterogenous distribution of radiopharmaceutical. No segmental or subsegmental perfusion defects to suggest acute PE. IMPRESSION: Low likelihood ratio for pulmonary embolism. Electronically Signed   By: Corlis Leak M.D.   On: 10/18/2022 16:30   DG CHEST PORT 1 VIEW  Result Date: 10/18/2022 CLINICAL DATA:  Syncope EXAM: PORTABLE CHEST 1 VIEW COMPARISON:  Chest radiograph 10/15/2018 FINDINGS: The cardiomediastinal silhouette is stable and within normal limits There is no focal consolidation or pulmonary edema. There is no pleural effusion or pneumothorax There is no acute osseous abnormality. Scoliotic curvature of the spine is noted.  IMPRESSION: Stable chest. Electronically Signed   By: Lesia Hausen M.D.   On: 10/18/2022 11:59   ECHOCARDIOGRAM COMPLETE  Result Date: 10/15/2022    ECHOCARDIOGRAM REPORT   Patient Name:   Brooke Weber Date of Exam: 10/15/2022 Medical Rec #:  [id number]         Height:       65.0 in Accession #:    [id number]        Weight:       149.9 lb Date of Birth:  22-Apr-1940          BSA:          1.750 m Patient Age:    82 years          BP:           145/88 mmHg Patient Gender: F                 HR:           70 bpm. Exam Location:  Inpatient Procedure: 2D Echo, Color Doppler and Cardiac Doppler Indications:    Syncope  History:        Patient has no prior history of Echocardiogram examinations.                 Signs/Symptoms:Syncope; Risk Factors:Hypertension.  Sonographer:    Darlys Gales Referring Phys: Loreta Ave SMITH IMPRESSIONS  1. Left ventricular ejection fraction, by estimation, is 60 to 65%. The left ventricle has normal function. The left ventricle has no regional wall motion abnormalities. Indeterminate diastolic filling due to E-A fusion.  2. Right ventricular systolic function is normal. The right ventricular size is normal.  3. Left atrial size was mildly dilated.  4. The mitral valve is normal in structure. No evidence of mitral valve regurgitation. No evidence of mitral stenosis.  5. The aortic valve is tricuspid. Aortic valve regurgitation is mild to moderate. Aortic valve sclerosis is present, with no evidence of aortic valve stenosis.  6. The inferior vena cava is normal in size with greater than 50% respiratory variability, suggesting right atrial pressure of 3 mmHg. Comparison(s): No prior Echocardiogram. FINDINGS  Left Ventricle: Left ventricular ejection fraction, by estimation, is 60 to 65%. The left ventricle has normal function. The left ventricle has no regional wall motion abnormalities. The left ventricular internal cavity size was normal in size. There is  no left ventricular  hypertrophy. Indeterminate diastolic filling due to E-A fusion. Right Ventricle: The right ventricular size is normal. No increase in right ventricular wall thickness. Right ventricular systolic function is normal. Left Atrium: Left atrial size was mildly dilated. Right Atrium: Right atrial size was normal in size. Pericardium: There is no evidence of pericardial effusion. Mitral Valve: The mitral  valve is normal in structure. No evidence of mitral valve regurgitation. No evidence of mitral valve stenosis. Tricuspid Valve: The tricuspid valve is normal in structure. Tricuspid valve regurgitation is not demonstrated. No evidence of tricuspid stenosis. Aortic Valve: The aortic valve is tricuspid. Aortic valve regurgitation is mild to moderate. Aortic regurgitation PHT measures 380 msec. Aortic valve sclerosis is present, with no evidence of aortic valve stenosis. Aortic valve mean gradient measures 7.0  mmHg. Aortic valve peak gradient measures 13.7 mmHg. Aortic valve area, by VTI measures 2.06 cm. Pulmonic Valve: The pulmonic valve was not well visualized. Pulmonic valve regurgitation is mild. No evidence of pulmonic stenosis. Aorta: The aortic root and ascending aorta are structurally normal, with no evidence of dilitation. Venous: The inferior vena cava is normal in size with greater than 50% respiratory variability, suggesting right atrial pressure of 3 mmHg. IAS/Shunts: No atrial level shunt detected by color flow Doppler.  LEFT VENTRICLE PLAX 2D LVIDd:         4.30 cm   Diastology LVIDs:         2.30 cm   LV e' medial:    6.42 cm/s LV PW:         0.90 cm   LV E/e' medial:  10.0 LV IVS:        0.90 cm   LV e' lateral:   7.40 cm/s LVOT diam:     1.70 cm   LV E/e' lateral: 8.7 LV SV:         67 LV SV Index:   38 LVOT Area:     2.27 cm  RIGHT VENTRICLE RV S prime:     17.20 cm/s TAPSE (M-mode): 2.4 cm LEFT ATRIUM             Index        RIGHT ATRIUM           Index LA Vol (A2C):   53.9 ml 30.80 ml/m  RA Area:      13.80 cm LA Vol (A4C):   72.2 ml 41.26 ml/m  RA Volume:   35.00 ml  20.00 ml/m LA Biplane Vol: 63.2 ml 36.11 ml/m  AORTIC VALVE AV Area (Vmax):    1.63 cm AV Area (Vmean):   1.53 cm AV Area (VTI):     2.06 cm AV Vmax:           185.00 cm/s AV Vmean:          130.000 cm/s AV VTI:            0.324 m AV Peak Grad:      13.7 mmHg AV Mean Grad:      7.0 mmHg LVOT Vmax:         133.00 cm/s LVOT Vmean:        87.800 cm/s LVOT VTI:          0.294 m LVOT/AV VTI ratio: 0.91 AI PHT:            380 msec  AORTA Ao Root diam: 2.80 cm MITRAL VALVE                TRICUSPID VALVE MV Area (PHT): 4.08 cm     TR Peak grad:   31.6 mmHg MV Decel Time: 186 msec     TR Vmax:        281.00 cm/s MV E velocity: 64.50 cm/s MV A velocity: 115.00 cm/s  SHUNTS MV E/A ratio:  0.56  Systemic VTI:  0.29 m                             Systemic Diam: 1.70 cm Riley Lam MD Electronically signed by Riley Lam MD Signature Date/Time: 10/15/2022/4:55:38 PM    Final    DG Chest 2 View  Result Date: 10/15/2022 CLINICAL DATA:  Syncope.  Loss of consciousness EXAM: CHEST - 2 VIEW COMPARISON:  01/03/2022 FINDINGS: Normal heart size. Lower mediastinal widening with gas suggesting hiatal hernia. There is no edema, consolidation, effusion, or pneumothorax. Artifact from EKG leads. IMPRESSION: No active cardiopulmonary disease. Electronically Signed   By: Tiburcio Pea M.D.   On: 10/15/2022 06:12   DG Knee Complete 4 Views Left  Result Date: 10/14/2022 CLINICAL DATA:  Pain after fall EXAM: LEFT KNEE - COMPLETE 4 VIEW COMPARISON:  None Available. FINDINGS: Transverse minimally displaced fracture of the patella inferiorly. Moderate joint effusion. Adjacent soft tissue swelling in the lateral view. No additional fracture or dislocation. Minimal osteophytes of the medial and lateral compartments. Slight joint space loss of the lateral compartment. Osteopenia. IMPRESSION: Minimally displaced transverse fracture of the  inferior aspect of the patella. Joint effusion and soft tissue swelling. Osteopenia Electronically Signed   By: Karen Kays M.D.   On: 10/14/2022 17:58   DG Elbow Complete Left  Result Date: 10/14/2022 CLINICAL DATA:  Pain after fall EXAM: LEFT ELBOW - COMPLETE 4 VIEW COMPARISON:  None Available. FINDINGS: Subtle nondisplaced radial neck fracture which is impacted. No additional fracture or dislocation. Osteopenia. Preserved joint spaces. IMPRESSION: Subtle impacted radial neck fracture. Electronically Signed   By: Karen Kays M.D.   On: 10/14/2022 17:56   CT Head Wo Contrast  Result Date: 10/14/2022 CLINICAL DATA:  Head trauma, minor (Age >= 65y) EXAM: CT HEAD WITHOUT CONTRAST TECHNIQUE: Contiguous axial images were obtained from the base of the skull through the vertex without intravenous contrast. RADIATION DOSE REDUCTION: This exam was performed according to the departmental dose-optimization program which includes automated exposure control, adjustment of the mA and/or kV according to patient size and/or use of iterative reconstruction technique. COMPARISON:  CT Head 09/25/21 FINDINGS: Brain: No evidence of acute infarction, hemorrhage, hydrocephalus, extra-axial collection or mass lesion/mass effect. Sequela of moderate chronic microvascular ischemic change. Generalized volume loss. Vascular: No hyperdense vessel or unexpected calcification. Skull: Soft tissue hematoma along the left frontal scalp. No evidence of an underlying calvarial fracture. Sinuses/Orbits: No middle ear or mastoid effusion. Paranasal sinuses are clear. Orbits are unremarkable. Bilateral lens replacements. Other: None. IMPRESSION: 1. No acute intracranial abnormality. 2. Soft tissue hematoma along the left frontal scalp. No evidence of an underlying calvarial fracture. Electronically Signed   By: Lorenza Cambridge M.D.   On: 10/14/2022 16:43     Subjective: Patient was seen and examined at bedside.  Overnight events noted.    Patient reports doing much better, wants to be discharged.  Patient being discharged to skilled nursing facility.  Discharge Exam: Vitals:   10/22/22 0359 10/22/22 0816  BP: (!) 143/65 (!) 106/51  Pulse: 84 (!) 52  Resp: 16 16  Temp:  98.3 F (36.8 C)  SpO2: 98% 98%   Vitals:   10/21/22 1504 10/21/22 1928 10/22/22 0359 10/22/22 0816  BP: (!) 155/56 (!) 178/74 (!) 143/65 (!) 106/51  Pulse: (!) 52 62 84 (!) 52  Resp: 16 18 16 16   Temp: 98.1 F (36.7 C) 98 F (36.7 C)  98.3 F (  36.8 C)  TempSrc: Oral     SpO2: 100% 100% 98% 98%  Weight:      Height:        General: Pt is alert, awake, not in acute distress Cardiovascular: RRR, S1/S2 +, no rubs, no gallops Respiratory: CTA bilaterally, no wheezing, no rhonchi Abdominal: Soft, NT, ND, bowel sounds + Extremities: no edema, no cyanosis    The results of significant diagnostics from this hospitalization (including imaging, microbiology, ancillary and laboratory) are listed below for reference.     Microbiology: Recent Results (from the past 240 hour(s))  Urine Culture     Status: Abnormal   Collection Time: 10/14/22  4:37 PM   Specimen: Urine, Clean Catch  Result Value Ref Range Status   Specimen Description   Final    URINE, CLEAN CATCH Performed at [facility], [address] [place], [place] [address]    Special Requests   Final    NONE Performed at [facility], [address], [place], [place] [address]    Culture >=100,000 COLONIES/mL ESCHERICHIA COLI (A)  Final   Report Status 10/17/2022 FINAL  Final   Organism ID, Bacteria ESCHERICHIA COLI (A)  Final      Susceptibility   Escherichia coli - MIC*    AMPICILLIN <=2 SENSITIVE Sensitive     CEFAZOLIN <=4 SENSITIVE Sensitive     CEFEPIME <=0.12 SENSITIVE Sensitive     CEFTRIAXONE <=0.25 SENSITIVE Sensitive     CIPROFLOXACIN <=0.25 SENSITIVE Sensitive     GENTAMICIN <=1 SENSITIVE Sensitive     IMIPENEM <=0.25 SENSITIVE  Sensitive     NITROFURANTOIN <=16 SENSITIVE Sensitive     TRIMETH/SULFA <=20 SENSITIVE Sensitive     AMPICILLIN/SULBACTAM <=2 SENSITIVE Sensitive     PIP/TAZO <=4 SENSITIVE Sensitive     * >=100,000 COLONIES/mL ESCHERICHIA COLI  Urine Culture (for pregnant, neutropenic or urologic patients or patients with an indwelling urinary catheter)     Status: Abnormal   Collection Time: 10/15/22  7:23 AM   Specimen: Urine, Clean Catch  Result Value Ref Range Status   Specimen Description URINE, CLEAN CATCH  Final   Special Requests   Final    NONE Performed at [facility], [address], [place], [place] [address]    Culture MULTIPLE SPECIES PRESENT, SUGGEST RECOLLECTION (A)  Final   Report Status 10/16/2022 FINAL  Final     Labs: BNP (last 3 results) No results for input(s): "BNP" in the last 8760 hours. Basic Metabolic Panel: Recent Labs  Lab 10/16/22 0140 10/17/22 0628 10/19/22 0140 10/22/22 0059  NA 129* 129* 130* 129*  K 3.2* 4.0 3.6 4.4  CL 91* 96* 95* 97*  CO2 25 23 25 25   GLUCOSE 117* 103* 111* 97  BUN 7* 8 11 9   CREATININE 0.69 0.64 0.65 0.62  CALCIUM 9.3 9.2 9.0 8.5*  MG  --   --   --  1.7  PHOS  --   --   --  3.0   Liver Function Tests: Recent Labs  Lab 10/19/22 0140  AST 20  ALT 19  ALKPHOS 97  BILITOT 0.9  PROT 5.7*  ALBUMIN 3.1*   No results for input(s): "LIPASE", "AMYLASE" in the last 168 hours. No results for input(s): "AMMONIA" in the last 168 hours. CBC: Recent Labs  Lab 10/16/22 0140 10/22/22 0059  WBC 9.5 7.7  HGB 14.1 12.0  HCT 40.1 34.7*  MCV 91.8 91.8  PLT 272 298  Cardiac Enzymes: No results for input(s): "CKTOTAL", "CKMB", "CKMBINDEX", "TROPONINI" in the last 168 hours. BNP: Invalid input(s): "POCBNP" CBG: No results for input(s): "GLUCAP" in the last 168 hours. D-Dimer No results for input(s): "DDIMER" in the last 72 hours. Hgb A1c No results for input(s): "HGBA1C" in the last 72 hours. Lipid Profile No results for  input(s): "CHOL", "HDL", "LDLCALC", "TRIG", "CHOLHDL", "LDLDIRECT" in the last 72 hours. Thyroid function studies No results for input(s): "TSH", "T4TOTAL", "T3FREE", "THYROIDAB" in the last 72 hours.  Invalid input(s): "FREET3" Anemia work up No results for input(s): "VITAMINB12", "FOLATE", "FERRITIN", "TIBC", "IRON", "RETICCTPCT" in the last 72 hours. Urinalysis    Component Value Date/Time   COLORURINE YELLOW 10/15/2022 0725   APPEARANCEUR HAZY (A) 10/15/2022 0725   LABSPEC 1.011 10/15/2022 0725   PHURINE 7.0 10/15/2022 0725   GLUCOSEU NEGATIVE 10/15/2022 0725   HGBUR NEGATIVE 10/15/2022 0725   BILIRUBINUR NEGATIVE 10/15/2022 0725   KETONESUR NEGATIVE 10/15/2022 0725   PROTEINUR NEGATIVE 10/15/2022 0725   NITRITE NEGATIVE 10/15/2022 0725   LEUKOCYTESUR SMALL (A) 10/15/2022 0725   Sepsis Labs Recent Labs  Lab 10/16/22 0140 10/22/22 0059  WBC 9.5 7.7   Microbiology Recent Results (from the past 240 hour(s))  Urine Culture     Status: Abnormal   Collection Time: 10/14/22  4:37 PM   Specimen: Urine, Clean Catch  Result Value Ref Range Status   Specimen Description   Final    URINE, CLEAN CATCH Performed at [facility], [address] [place], [place] [address]    Special Requests   Final    NONE Performed at [facility], [address], [place], [place] [address]    Culture >=100,000 COLONIES/mL ESCHERICHIA COLI (A)  Final   Report Status 10/17/2022 FINAL  Final   Organism ID, Bacteria ESCHERICHIA COLI (A)  Final      Susceptibility   Escherichia coli - MIC*    AMPICILLIN <=2 SENSITIVE Sensitive     CEFAZOLIN <=4 SENSITIVE Sensitive     CEFEPIME <=0.12 SENSITIVE Sensitive     CEFTRIAXONE <=0.25 SENSITIVE Sensitive     CIPROFLOXACIN <=0.25 SENSITIVE Sensitive     GENTAMICIN <=1 SENSITIVE Sensitive     IMIPENEM <=0.25 SENSITIVE Sensitive     NITROFURANTOIN <=16 SENSITIVE Sensitive     TRIMETH/SULFA <=20 SENSITIVE Sensitive      AMPICILLIN/SULBACTAM <=2 SENSITIVE Sensitive     PIP/TAZO <=4 SENSITIVE Sensitive     * >=100,000 COLONIES/mL ESCHERICHIA COLI  Urine Culture (for pregnant, neutropenic or urologic patients or patients with an indwelling urinary catheter)     Status: Abnormal   Collection Time: 10/15/22  7:23 AM   Specimen: Urine, Clean Catch  Result Value Ref Range Status   Specimen Description URINE, CLEAN CATCH  Final   Special Requests   Final    NONE Performed at [facility], [address], [place], [place] [address]    Culture MULTIPLE SPECIES PRESENT, SUGGEST RECOLLECTION (A)  Final   Report Status 10/16/2022 FINAL  Final     Time coordinating discharge: Over 30 minutes  SIGNED:   Willeen Niece, MD  [facility] 10/22/2022, 1:00 PM Pager   If 7PM-7AM, please contact night-coverage

## 2022-10-22 NOTE — TOC Transition Note (Signed)
Transition of Care [facility]) - CM/SW Discharge Note   Patient Details  Name: Brooke Weber MRN: [id number] Date of Birth: 06-17-1940  Transition of [facility]) CM/SW Contact:  Lorri Frederick, LCSW Phone Number: 10/22/2022, 1:16 PM   Clinical Narrative:   Pt discharging to [facility].  RN call report to [phone].    Final next level of care: Skilled Nursing Facility Barriers to Discharge: Barriers Resolved   Patient Goals and CMS Choice      Discharge Placement                Patient chooses bed at: [facility] [place] Patient to be transferred to facility by: husband Earvin Hansen Name of family member notified: husband Earvin Hansen in room Patient and family notified of of transfer: 10/22/22  Discharge Plan and Services Additional resources added to the After Visit Summary for   In-house Referral: Clinical Social Work   Post Acute Care Choice: Skilled Nursing Facility                               Social Determinants of Health (SDOH) Interventions SDOH Screenings   Food Insecurity: No Food Insecurity (10/15/2022)  Housing: Low Risk  (10/15/2022)  Transportation Needs: No Transportation Needs (10/15/2022)  Utilities: Not At Risk (10/15/2022)  Financial Resource Strain: Low Risk  (02/16/2020)   Received from [facility] visits prior to 05/04/2022., [facility] visits prior to 05/04/2022.  Social Connections: Unknown (02/16/2020)   Received from [facility] visits prior to 05/04/2022., [facility] visits prior to 05/04/2022.  Tobacco Use: Low Risk  (10/15/2022)     Readmission Risk Interventions     No data to display

## 2022-10-22 NOTE — Discharge Instructions (Signed)
Advised to follow up PCP in one week. Advised to reduce metoprolol to 50 mg daily due to bradycardia and hypotension. Resume lisinipril if BP improves.

## 2022-10-26 DIAGNOSIS — I959 Hypotension, unspecified: Secondary | ICD-10-CM | POA: Diagnosis not present

## 2022-10-26 DIAGNOSIS — R55 Syncope and collapse: Secondary | ICD-10-CM | POA: Diagnosis not present

## 2022-10-26 DIAGNOSIS — N390 Urinary tract infection, site not specified: Secondary | ICD-10-CM | POA: Diagnosis not present

## 2022-11-05 DIAGNOSIS — K625 Hemorrhage of anus and rectum: Secondary | ICD-10-CM | POA: Diagnosis not present

## 2022-11-06 DIAGNOSIS — M25562 Pain in left knee: Secondary | ICD-10-CM | POA: Diagnosis not present

## 2022-11-06 DIAGNOSIS — M25522 Pain in left elbow: Secondary | ICD-10-CM | POA: Diagnosis not present

## 2022-11-18 DIAGNOSIS — F03B4 Unspecified dementia, moderate, with anxiety: Secondary | ICD-10-CM | POA: Diagnosis not present

## 2022-11-18 DIAGNOSIS — F5101 Primary insomnia: Secondary | ICD-10-CM | POA: Diagnosis not present

## 2022-11-20 DIAGNOSIS — F05 Delirium due to known physiological condition: Secondary | ICD-10-CM | POA: Diagnosis not present

## 2022-11-23 DIAGNOSIS — Z79899 Other long term (current) drug therapy: Secondary | ICD-10-CM | POA: Diagnosis not present

## 2022-11-28 DIAGNOSIS — S82035G Nondisplaced transverse fracture of left patella, subsequent encounter for closed fracture with delayed healing: Secondary | ICD-10-CM | POA: Diagnosis not present

## 2022-11-30 ENCOUNTER — Emergency Department (HOSPITAL_COMMUNITY)
Admission: EM | Admit: 2022-11-30 | Discharge: 2022-12-05 | Disposition: A | Discharge status: Skilled Nursing Facility | Payer: Medicare Other | Attending: Emergency Medicine | Admitting: Emergency Medicine

## 2022-11-30 ENCOUNTER — Emergency Department (HOSPITAL_COMMUNITY): Payer: Medicare Other

## 2022-11-30 DIAGNOSIS — Z79899 Other long term (current) drug therapy: Secondary | ICD-10-CM | POA: Diagnosis not present

## 2022-11-30 DIAGNOSIS — W19XXXA Unspecified fall, initial encounter: Secondary | ICD-10-CM | POA: Diagnosis not present

## 2022-11-30 DIAGNOSIS — R2681 Unsteadiness on feet: Secondary | ICD-10-CM | POA: Diagnosis not present

## 2022-11-30 DIAGNOSIS — F0390 Unspecified dementia without behavioral disturbance: Secondary | ICD-10-CM | POA: Diagnosis not present

## 2022-11-30 DIAGNOSIS — R531 Weakness: Secondary | ICD-10-CM | POA: Insufficient documentation

## 2022-11-30 DIAGNOSIS — S0990XA Unspecified injury of head, initial encounter: Secondary | ICD-10-CM | POA: Diagnosis not present

## 2022-11-30 DIAGNOSIS — R9082 White matter disease, unspecified: Secondary | ICD-10-CM | POA: Diagnosis not present

## 2022-11-30 LAB — COMPREHENSIVE METABOLIC PANEL
ALT: 40 U/L (ref 0–44)
AST: 34 U/L (ref 15–41)
Albumin: 3.7 g/dL (ref 3.5–5.0)
Alkaline Phosphatase: 116 U/L (ref 38–126)
Anion gap: 9 (ref 5–15)
BUN: 12 mg/dL (ref 8–23)
CO2: 28 mmol/L (ref 22–32)
Calcium: 9.1 mg/dL (ref 8.9–10.3)
Chloride: 107 mmol/L (ref 98–111)
Creatinine, Ser: 0.59 mg/dL (ref 0.44–1.00)
GFR, Estimated: >60 mL/min (ref >60)
Glucose, Bld: 118 mg/dL — ABNORMAL HIGH (ref 70–99)
Potassium: 3.2 mmol/L — ABNORMAL LOW (ref 3.5–5.1)
Sodium: 144 mmol/L (ref 135–145)
Total Bilirubin: 0.9 mg/dL (ref 0.3–1.2)
Total Protein: 6.6 g/dL (ref 6.5–8.1)

## 2022-11-30 LAB — URINALYSIS, ROUTINE W REFLEX MICROSCOPIC
Bilirubin Urine: NEGATIVE
Glucose, UA: NEGATIVE mg/dL
Hgb urine dipstick: NEGATIVE
Ketones, ur: 5 mg/dL — AB
Leukocytes,Ua: NEGATIVE
Nitrite: NEGATIVE
Protein, ur: NEGATIVE mg/dL
Specific Gravity, Urine: 1.012 (ref 1.005–1.030)
pH: 7 (ref 5.0–8.0)

## 2022-11-30 LAB — CBC WITH DIFFERENTIAL/PLATELET
Abs Immature Granulocytes: 0.02 10*3/uL (ref 0.00–0.07)
Basophils Absolute: 0.1 10*3/uL (ref 0.0–0.1)
Basophils Relative: 1 %
Eosinophils Absolute: 0.1 10*3/uL (ref 0.0–0.5)
Eosinophils Relative: 1 %
HCT: 39.9 % (ref 36.0–46.0)
Hemoglobin: 13.2 g/dL (ref 12.0–15.0)
Immature Granulocytes: 0 %
Lymphocytes Relative: 10 %
Lymphs Abs: 0.9 10*3/uL (ref 0.7–4.0)
MCH: 31.8 pg (ref 26.0–34.0)
MCHC: 33.1 g/dL (ref 30.0–36.0)
MCV: 96.1 fL (ref 80.0–100.0)
Monocytes Absolute: 0.6 10*3/uL (ref 0.1–1.0)
Monocytes Relative: 7 %
Neutro Abs: 7 10*3/uL (ref 1.7–7.7)
Neutrophils Relative %: 81 %
Platelets: 311 10*3/uL (ref 150–400)
RBC: 4.15 MIL/uL (ref 3.87–5.11)
RDW: 13.4 % (ref 11.5–15.5)
WBC: 8.6 10*3/uL (ref 4.0–10.5)
nRBC: 0 % (ref 0.0–0.2)

## 2022-11-30 LAB — TSH: TSH: 0.732 u[IU]/mL (ref 0.350–4.500)

## 2022-11-30 MED ORDER — POTASSIUM CHLORIDE 20 MEQ PO PACK
40.0000 meq | PACK | Freq: Once | ORAL | Status: AC
  Administered 2022-11-30: 40 meq via ORAL
  Filled 2022-11-30: qty 2

## 2022-11-30 MED ORDER — ACETAMINOPHEN 325 MG PO TABS
650.0000 mg | ORAL_TABLET | Freq: Four times a day (QID) | ORAL | Status: DC | PRN
  Administered 2022-12-01 – 2022-12-04 (×6): 650 mg via ORAL
  Filled 2022-11-30 (×6): qty 2

## 2022-11-30 MED ORDER — ATENOLOL 50 MG PO TABS
50.0000 mg | ORAL_TABLET | Freq: Every day | ORAL | Status: DC
  Administered 2022-12-01 – 2022-12-04 (×5): 50 mg via ORAL
  Filled 2022-11-30 (×6): qty 1

## 2022-11-30 MED ORDER — ATORVASTATIN CALCIUM 10 MG PO TABS
20.0000 mg | ORAL_TABLET | Freq: Every day | ORAL | Status: DC
  Administered 2022-11-30 – 2022-12-04 (×5): 20 mg via ORAL
  Filled 2022-11-30 (×5): qty 2

## 2022-11-30 MED ORDER — PANTOPRAZOLE SODIUM 40 MG PO TBEC
40.0000 mg | DELAYED_RELEASE_TABLET | Freq: Every day | ORAL | Status: DC
  Administered 2022-11-30 – 2022-12-05 (×6): 40 mg via ORAL
  Filled 2022-11-30 (×7): qty 1

## 2022-11-30 MED ORDER — TRAZODONE HCL 50 MG PO TABS
25.0000 mg | ORAL_TABLET | Freq: Every evening | ORAL | Status: DC | PRN
  Administered 2022-12-01 – 2022-12-04 (×4): 50 mg via ORAL
  Filled 2022-11-30 (×5): qty 1

## 2022-11-30 NOTE — ED Provider Notes (Signed)
[place] EMERGENCY DEPARTMENT AT [facility] Provider Note   CSN: [id number] Arrival date & time: 11/30/22  1024     History  Chief Complaint  Patient presents with   Marletta Lor    Brooke Weber is a 82 y.o. female.  This is an 82 year old female who presents to the emergency department with her husband for generalized weakness and inability to take care of herself at home.  History is obtained entirely via the patient's husband, and chart review as the patient has dementia.  Husband tells me that over the last 3 to 4 years, patient has been becoming progressively forgetful.  Her baseline is not knowing the year or the month.  Patient was admitted at [facility] for a tibial plateau fracture and a distal radius fracture.  After discharge, she went to a skilled nursing facility for 4 weeks.  Yesterday, the patient's husband brought the patient home, and realized that she was more difficult to care for than he had realized.  He says that in the middle of the night, she went to go to the bathroom, swelling her legs over the bed, and fell down to the ground.  She has not been able to walk since her most recent fall.  Patient not have a prolonged time on the ground.  Family called EMS for assistance in getting to the bed, patient was brought to the emergency department.   Fall       Home Medications Prior to Admission medications   Medication Sig Start Date End Date Taking? Authorizing Provider  acetaminophen (TYLENOL) 325 MG tablet Take 650 mg by mouth every 6 (six) hours as needed for mild pain.    [provider]  atenolol (TENORMIN) 50 MG tablet Take 1 tablet (50 mg total) by mouth at bedtime. 10/22/22   Willeen Niece, MD  atorvastatin (LIPITOR) 20 MG tablet Take 20 mg by mouth at bedtime. 07/29/21   [provider]  naloxone [facility]) nasal spray 4 mg/0.1 mL Place 1 spray into the nose once. 04/02/17   [provider]  omeprazole (PRILOSEC)  20 MG capsule Take 20 mg by mouth daily.    [provider]  Polyethyl Glycol-Propyl Glycol (SYSTANE) 0.4-0.3 % SOLN Place 2 drops into both eyes 3 (three) times daily.    [provider]  polyethylene glycol (MIRALAX / GLYCOLAX) packet Take 17 g by mouth daily.    [provider]  traZODone (DESYREL) 50 MG tablet Take 25-50 mg by mouth at bedtime as needed. 10/09/22   [provider]      Allergies    Aspirin, Bee venom, Iodine, Other, and Penicillins    Review of Systems   Review of Systems  Physical Exam Updated Vital Signs BP (!) 160/75 (BP Location: Right Arm)   Pulse 77   Temp 97.6 F (36.4 C) (Oral)   Resp 20   SpO2 99%  Physical Exam Vitals reviewed.  HENT:     Head: Normocephalic and atraumatic.  Cardiovascular:     Rate and Rhythm: Normal rate and regular rhythm.  Pulmonary:     Effort: Pulmonary effort is normal.  Abdominal:     General: Abdomen is flat. There is no distension.     Palpations: Abdomen is soft.  Skin:    General: Skin is warm and dry.     Findings: No bruising.  Neurological:     Mental Status: She is alert. Mental status is at baseline.  Cranial Nerves: No cranial nerve deficit.     Sensory: No sensory deficit.     Motor: No weakness.     ED Results / Procedures / Treatments   Labs (all labs ordered are listed, but only abnormal results are displayed) Labs Reviewed  COMPREHENSIVE METABOLIC PANEL - Abnormal; Notable for the following components:      Result Value   Potassium 3.2 (*)    Glucose, Bld 118 (*)    All other components within normal limits  CBC WITH DIFFERENTIAL/PLATELET  TSH  URINALYSIS, ROUTINE W REFLEX MICROSCOPIC  CBG MONITORING, ED    EKG None  Radiology CT Head Wo Contrast  Result Date: 11/30/2022 CLINICAL DATA:  Ataxia.  Fall.  Head trauma. EXAM: CT HEAD WITHOUT CONTRAST TECHNIQUE: Contiguous axial images were obtained from the base of the skull through the vertex without  intravenous contrast. RADIATION DOSE REDUCTION: This exam was performed according to the departmental dose-optimization program which includes automated exposure control, adjustment of the mA and/or kV according to patient size and/or use of iterative reconstruction technique. COMPARISON:  10/14/2022 FINDINGS: Brain: There is no evidence for acute hemorrhage, hydrocephalus, mass lesion, or abnormal extra-axial fluid collection. No definite CT evidence for acute infarction. Diffuse loss of parenchymal volume is consistent with atrophy. Patchy low attenuation in the deep hemispheric and periventricular white matter is nonspecific, but likely reflects chronic microvascular ischemic demyelination. Vascular: No hyperdense vessel or unexpected calcification. Skull: No evidence for fracture. No worrisome lytic or sclerotic lesion. Sinuses/Orbits: The visualized paranasal sinuses and mastoid air cells are clear. Visualized portions of the globes and intraorbital fat are unremarkable. Other: None. IMPRESSION: 1. No acute intracranial abnormality. 2. Atrophy with chronic small vessel white matter ischemic disease. Electronically Signed   By: Kennith Center M.D.   On: 11/30/2022 12:36    Procedures Procedures    Medications Ordered in ED Medications  potassium chloride (KLOR-CON) packet 40 mEq (40 mEq Oral Given 11/30/22 1308)    ED Course/ Medical Decision Making/ A&P                                 Medical Decision Making This is an 82 year old female who is here today for a fall, but mostly because they are unable to take care of the patient at home.  Plan-will obtain imaging of the patient's head, basic labs.  Patient has no tenderness to palpation in the hips, pelvis.  She has no pain with passive range of motion.  No indication for imaging of the chest abdomen pelvis or lower extremities.  I discussed the patient's situation with the husband.  He does not believe that he is capable of taking care of the  patient at home, has concerns about even things such as home health aides.  I will place a TOC consult.  Reassessment-my independent review the patient's head CT shows no intracranial hemorrhage.  Patient will be signed out to Dr. Wilkie Aye pending UA and TOC consult.  Amount and/or Complexity of Data Reviewed Labs: ordered. Radiology: ordered.  Risk Prescription drug management.           Final Clinical Impression(s) / ED Diagnoses Final diagnoses:  Weakness    Rx / DC Orders ED Discharge Orders     None         Arletha Pili, DO 11/30/22 1515

## 2022-11-30 NOTE — Evaluation (Signed)
Physical Therapy Evaluation Patient Details Name: Brooke Weber MRN: [id number] DOB: 18-Jul-1940 Today's Date: 11/30/2022  History of Present Illness  82 y.o. [facility]  after fall at home. Patient DC'd from [facility] 11/29/22  for  L fractured radius and L minimally displaced transverse fracture of the inferior aspect of the patella sustained 10/15/22 . WUJ:[id number], hypertension, arthritis, anxiety, and GERD  Clinical Impression  Pt admitted with above diagnosis. . Pt currently with functional limitations due to the deficits listed below (see PT Problem List). Pt will benefit from acute skilled PT to increase their independence and safety with mobility to allow discharge.     The patient is pleasantly confused, able to follow simple instructions. No family present for updates on mobility since DC from [facility] 11/29/22.  No indication of any restrictions since L radial fracture and L patella fracture on 10/15/22.  Patient did ambulate with Rw with min assistance x 30'.  Patient currently is more custodial care and not requiring skilled  PT in a facility. Recommend HHPT if returns to home. Patient will require 24/7 support due to dementia and fall risk. PT will follow to prevent decline in current level of function.      If plan is discharge home, recommend the following: A little help with walking and/or transfers;A lot of help with bathing/dressing/bathroom;Assistance with cooking/housework;Assist for transportation;Help with stairs or ramp for entrance   Can travel by private vehicle        Equipment Recommendations None recommended by PT  Recommendations for Other Services       Functional Status Assessment Patient has had a recent decline in their functional status and/or demonstrates limited ability to make significant improvements in function in a reasonable and predictable amount of time     Precautions / Restrictions Precautions Precautions: Fall Restrictions Other  Position/Activity Restrictions: unsure, left [facility] 8/19 wit sling and NWB LUE and KI left LE/ WBAT, no updated orders.      Mobility  Bed Mobility Overal bed mobility: Needs Assistance Bed Mobility: Supine to Sit, Sit to Supine     Supine to sit: Min assist Sit to supine: Min assist        Transfers Overall transfer level: Needs assistance Equipment used: Rolling walker (2 wheels) Transfers: Sit to/from Stand Sit to Stand: Min assist           General transfer comment: min from bed and toilet with rail    Ambulation/Gait Ambulation/Gait assistance: Min assist Gait Distance (Feet): 30 Feet Assistive device: Rolling walker (2 wheels) Gait Pattern/deviations: Step-to pattern Gait velocity: decr     General Gait Details: cues for safety assist with RW for turns  Stairs            Wheelchair Mobility     Tilt Bed    Modified Rankin (Stroke Patients Only)       Balance Overall balance assessment: Needs assistance, History of Falls Sitting-balance support: Feet supported, Bilateral upper extremity supported Sitting balance-[name]: Fair     Standing balance support: During functional activity, No upper extremity supported, Reliant on assistive device for balance Standing balance-[name]: Poor Standing balance comment: steady support when standing and attempting pericare atoilet                             Pertinent Vitals/Pain Pain Assessment Pain Assessment: Faces Faces Pain Scale: Hurts little more Pain Location: left knee  Home Living Family/patient expects to be discharged to:: Private residence Living Arrangements: Spouse/significant other Available Help at Discharge: Family;Available 24 hours/day Type of Home: House Home Access: Stairs to enter   [employer] of Steps: 4   Home Layout: One level Home Equipment: Rollator (4 wheels);Cane - single point;Shower seat - built in Additional Comments: info from  previous admission , no family present to update    Prior Function               Mobility Comments: assumes assistance for ambulation with Rw       Extremity/Trunk Assessment   Upper Extremity Assessment Upper Extremity Assessment: Overall WFL for tasks assessed    Lower Extremity Assessment Lower Extremity Assessment: LLE deficits/detail LLE Deficits / Details: noted dereased knee flex but at least 80* for sitting, did not push it    Cervical / Trunk Assessment Cervical / Trunk Assessment: Normal  Communication   Communication Communication: No apparent difficulties  Cognition Arousal: Alert Behavior During Therapy: WFL for tasks assessed/performed Overall Cognitive Status: History of cognitive impairments - at baseline                                 General Comments: O x 1        General Comments      Exercises     Assessment/Plan    PT Assessment Patient needs continued PT services  PT Problem List Decreased strength;Decreased mobility;Decreased safety awareness;Decreased range of motion;Decreased activity tolerance;Decreased cognition;Decreased knowledge of use of DME;Decreased balance       PT Treatment Interventions DME instruction;Therapeutic activities;Gait training;Therapeutic exercise;Patient/family education;Functional mobility training    PT Goals (Current goals can be found in the Care Plan section)  Acute Rehab PT Goals PT Goal Formulation: Patient unable to participate in goal setting Time For Goal Achievement: 12/14/22 Potential to Achieve Goals: Fair    Frequency Min 1X/week     Co-evaluation               AM-PAC PT "6 Clicks" Mobility  Outcome Measure Help needed turning from your back to your side while in a flat bed without using bedrails?: A Little Help needed moving from lying on your back to sitting on the side of a flat bed without using bedrails?: A Little Help needed moving to and from a bed to a chair  (including a wheelchair)?: A Little Help needed standing up from a chair using your arms (e.g., wheelchair or bedside chair)?: A Little Help needed to walk in hospital room?: A Little Help needed climbing 3-5 steps with a railing? : A Lot 6 Click Score: 17    End of Session Equipment Utilized During Treatment: Gait belt Activity Tolerance: Patient tolerated treatment well Patient left: in bed;with bed alarm set;with call bell/phone within reach Nurse Communication: Mobility status PT Visit Diagnosis: Unsteadiness on feet (R26.81);Difficulty in walking, not elsewhere classified (R26.2)    Time: 4098-1191 PT Time Calculation (min) (ACUTE ONLY): 22 min   Charges:   PT Evaluation $PT Eval Low Complexity: 1 Low   PT General Charges $$ ACUTE PT VISIT: 1 Visit         Blanchard Kelch PT Acute Rehabilitation Services Office [phone] Weekend pager-[phone]   Rada Hay 11/30/2022, 4:54 PM

## 2022-11-30 NOTE — ED Triage Notes (Signed)
Patient BIB EMS from [facility] due to fall. Patient fell last night and no apparent injuries so not sent to ER, patients husband wanted patient sent to ER to be check out today. Patient did have a fall about one month ago and went to [facility] for radius and tibia fx. Patient is alert and oriented x 2, baseline for her Dementia. Denies LOC with fall. JRPRN

## 2022-11-30 NOTE — ED Provider Notes (Signed)
Patient signed out to me by previous provider. Please refer to their note for full HPI.  Briefly this is an 82 year old female who presented with husband for generalized weakness and inability to care for herself at home.  Workup here has been reassuring, signed out pending urinalysis, TOC order in place.  Urinalysis shows no UTI.  Plan for boarding for [facility] assessment and hopeful for rehab placement after discussion with husband.  Patient stable at time of boarding.   Rozelle Logan, DO 11/30/22 2107

## 2022-11-30 NOTE — ED Triage Notes (Signed)
Patients husband has just arrived and he states they live in a home at [employer]. Husband states patient slid in the floor twice last night. Husband decided to call EMS for patient to be checked out today. JRPRN

## 2022-11-30 NOTE — ED Notes (Signed)
Patient removing equipment. Bed alarm on and anti slip socks placed.

## 2022-11-30 NOTE — Care Management (Addendum)
Transition of Care [facility]) - Emergency Department Mini Assessment   Patient Details  Name: Brooke Weber MRN: [id number] Date of Birth: 10-Nov-1940  Transition of Care [facility]) CM/SW Contact:    Lockie Pares, RN Phone Number: 11/30/2022, 1:32 PM   Clinical Narrative:  Patient presented with a fall, was just discharged from [facility] after a convalescent stay. Attempted to call patients husband for more information, left confidential VM to call this RNCM back. Patient last with [facility] according to PING< but not active currently. Messaged with team. PT consult placed for evaluation. 1500 Attempted to call husband Mr Uvaldo Rising back, no answer left another message to call this RNCM back, ED Mini Assessment: What brought you to the Emergency Department? : fall                Patient Contact and Communications        ,                 Admission diagnosis:  weakness Patient Active Problem List   Diagnosis Date Noted   Syncope 10/15/2022   Left medial tibial plateau fracture 10/15/2022   Fracture of radial neck, left, closed 10/15/2022   Fall at home, initial encounter 10/15/2022   UTI (urinary tract infection) 10/15/2022   Transient hypotension 10/15/2022   Essential hypertension 09/26/2021   Gastroesophageal reflux disease 09/26/2021   Mild cognitive impairment 09/26/2021   Hypokalemia 09/26/2021   Hyponatremia 09/26/2021   Chronic hyponatremia 09/25/2021   Chronic pain syndrome 03/20/2017   Long-term current use of opiate analgesic 03/20/2017   PCP:  Daisy Floro, MD Pharmacy:   [id number] [place] [place] - [address] [place] [address] Phone: [phone] Fax: [phone]  [facility] [address] [place] [address] Phone: [phone] Fax: [phone]

## 2022-11-30 NOTE — Progress Notes (Signed)
TOC following for PT eval. Per chart review, pt was admitted to [facility] on 8/13 and discharged to [facility] on 8/20. CSW spoke with staff at [facility] who reported the pt was discharged from their facility on yesterday. RNCM attempted to contact pt's husband to inquire about [facility] services. PT ordered due to dual consult. TOC will follow for recommendations. Pt will likely be in copay days if recommended for SNF. Pt has Medicare A/B with waiver.

## 2022-12-01 DIAGNOSIS — R531 Weakness: Secondary | ICD-10-CM | POA: Diagnosis not present

## 2022-12-01 MED ORDER — LORAZEPAM 1 MG PO TABS
1.0000 mg | ORAL_TABLET | ORAL | Status: DC | PRN
  Administered 2022-12-01 – 2022-12-02 (×4): 1 mg via ORAL
  Filled 2022-12-01 (×4): qty 1

## 2022-12-01 MED ORDER — DIPHENHYDRAMINE HCL 50 MG/ML IJ SOLN: 12.5000 mg | Freq: Once | INTRAMUSCULAR | Status: DC

## 2022-12-01 MED ORDER — LORAZEPAM 2 MG/ML IJ SOLN: 2.0000 mg | Freq: Once | INTRAMUSCULAR | Status: DC

## 2022-12-01 NOTE — ED Notes (Signed)
Husband at bedside. No inappropriate behaviors observed.

## 2022-12-01 NOTE — ED Notes (Signed)
Pt is asleep. Rise and fall of chest observed.

## 2022-12-01 NOTE — [place] FL2 (Signed)
[place] MEDICAID FL2 LEVEL OF CARE FORM     IDENTIFICATION  Patient Name: Brooke Weber Birthdate: [birth year] Sex: female Admission Date (Current Location): 11/30/2022  [facility] and [place] Number:  Producer, television/film/video and Address:  [facility],  [address] [place], [place] [address]      Provider Number: [id number]  Attending Physician Name and Address:  Rozelle Logan, DO  Relative Name and Phone Number:  Eloise Harman (spouse) - [phone]    Current Level of Care: Hospital Recommended Level of Care: Skilled Nursing Facility Prior Approval Number:    Date Approved/Denied:   PASRR Number: [id number]  Discharge Plan: SNF    Current Diagnoses: Patient Active Problem List   Diagnosis Date Noted   Syncope 10/15/2022   Left medial tibial plateau fracture 10/15/2022   Fracture of radial neck, left, closed 10/15/2022   Fall at home, initial encounter 10/15/2022   UTI (urinary tract infection) 10/15/2022   Transient hypotension 10/15/2022   Essential hypertension 09/26/2021   Gastroesophageal reflux disease 09/26/2021   Mild cognitive impairment 09/26/2021   Hypokalemia 09/26/2021   Hyponatremia 09/26/2021   Chronic hyponatremia 09/25/2021   Chronic pain syndrome 03/20/2017   Long-term current use of opiate analgesic 03/20/2017    Orientation RESPIRATION BLADDER Height & Weight     Self  Normal Incontinent Weight:   Height:     BEHAVIORAL SYMPTOMS/MOOD NEUROLOGICAL BOWEL NUTRITION STATUS      Incontinent Diet (Regular)  AMBULATORY STATUS COMMUNICATION OF NEEDS Skin   Limited Assist Verbally Normal                       Personal Care Assistance Level of Assistance  Bathing, Feeding, Dressing Bathing Assistance: Maximum assistance Feeding assistance: Maximum assistance Dressing Assistance: Maximum assistance     Functional Limitations Info  Sight, Hearing, Speech Sight Info: Adequate Hearing Info: Adequate Speech Info: Adequate     SPECIAL CARE FACTORS FREQUENCY  PT (By licensed PT), OT (By licensed OT)     PT Frequency: x5/week OT Frequency: x5/week            Contractures Contractures Info: Not present    Additional Factors Info  Code Status, Allergies, Psychotropic Code Status Info: Full Allergies Info: Aspirin  Bee Venom  Iodine  Other  Penicillins  Seroquel (Quetiapine)           Current Medications (12/01/2022):  This is the current hospital active medication list Current Facility-Administered Medications  Medication Dose Route Frequency Provider Last Rate Last Admin   acetaminophen (TYLENOL) tablet 650 mg  650 mg Oral Q6H PRN Horton, Kristie M, DO       atenolol (TENORMIN) tablet 50 mg  50 mg Oral QHS Horton, Kristie M, DO   50 mg at 12/01/22 0003   atorvastatin (LIPITOR) tablet 20 mg  20 mg Oral QHS Horton, Kristie M, DO   20 mg at 11/30/22 2142   diphenhydrAMINE (BENADRYL) injection 12.5 mg  12.5 mg Intramuscular Once Cardama, Amadeo Garnet, MD       LORazepam (ATIVAN) injection 2 mg  2 mg Intramuscular Once Cardama, Amadeo Garnet, MD       pantoprazole (PROTONIX) EC tablet 40 mg  40 mg Oral Daily Horton, Kristie M, DO   40 mg at 12/01/22 2440   traZODone (DESYREL) tablet 25-50 mg  25-50 mg Oral QHS PRN Horton, Clabe Seal, DO       Current Outpatient Medications  Medication Sig Dispense Refill  acetaminophen (TYLENOL) 325 MG tablet Take 650 mg by mouth every 6 (six) hours as needed for mild pain.     atorvastatin (LIPITOR) 20 MG tablet Take 20 mg by mouth at bedtime.     divalproex (DEPAKOTE) 125 MG DR tablet Take 125 mg by mouth 2 (two) times daily.     LORazepam (ATIVAN) 1 MG tablet Take 1 mg by mouth 2 (two) times daily.     metoprolol tartrate (LOPRESSOR) 25 MG tablet Take 25 mg by mouth 2 (two) times daily.     omeprazole (PRILOSEC) 20 MG capsule Take 20 mg by mouth daily.     Oxycodone HCl 10 MG TABS Take 1 tablet by mouth 4 (four) times daily as needed (pain).     polyethylene  glycol (MIRALAX / GLYCOLAX) packet Take 34 g by mouth daily.     senna (SENOKOT) 8.6 MG tablet Take 1 tablet by mouth 2 (two) times daily.     atenolol (TENORMIN) 50 MG tablet Take 1 tablet (50 mg total) by mouth at bedtime. (Patient not taking: Reported on 11/30/2022) 30 tablet 1   naloxone (NARCAN) nasal spray 4 mg/0.1 mL Place 1 spray into the nose once. (Patient not taking: Reported on 11/30/2022)     Polyethyl Glycol-Propyl Glycol (SYSTANE) 0.4-0.3 % SOLN Place 2 drops into both eyes 3 (three) times daily.     traZODone (DESYREL) 50 MG tablet Take 25-50 mg by mouth at bedtime as needed. (Patient not taking: Reported on 11/30/2022)       Discharge Medications: Please see discharge summary for a list of discharge medications.  Relevant Imaging Results:  Relevant Lab Results:   Additional Information SSN: [id number]  Princella Ion, LCSW

## 2022-12-01 NOTE — Progress Notes (Addendum)
CSW spoke to pt's spouse who reports pt "will not return to [facility] under any circumstances." CSW did inform Mr. Uvaldo Rising that pt will be in copay days at [facility] and he reports he can pay "if it is reasonable." CSW did inform of SNFs who accept [facility] Waiver and Mr.Uvaldo Rising would like for pt to be faxed out to see who offers a bed. He did report he prefers a SNF closer to [employer], where they live. CSW to fax out. Bed offers pending. TOC anticipates delays in offers due to it being the weekend. Spouse verbalized understanding.   Estelle Grumbles (spouse, cell): [phone]

## 2022-12-01 NOTE — ED Notes (Signed)
Pt is asleep. Rise and fall of chest observed. Bed alarm is on.

## 2022-12-01 NOTE — Progress Notes (Addendum)
CSW attempted to contact pt's spouse without success. Left HIPAA Compliant voicemail.   PT recommended SNF; however, disposition (SNF vs HH) contingent upon pt and spouse being able to pay copay for SNF. RN to notify CSW when spouse is at bedside. CSW will continue to outreach by phone.

## 2022-12-01 NOTE — ED Notes (Signed)
Pt is asleep. Rise and fall of chest is observed.

## 2022-12-01 NOTE — ED Notes (Signed)
Pt is in bathroom with tech. No inappropriate behaviors observed at this time.

## 2022-12-01 NOTE — ED Provider Notes (Signed)
Emergency Medicine Observation Re-evaluation Note  Brooke Weber is a 82 y.o. female, seen on rounds today.  Pt initially presented to the ED for complaints of Fall Currently, the patient is resting.  Physical Exam  BP (!) 158/74 (BP Location: Left Arm)   Pulse 65   Temp 97.7 F (36.5 C) (Oral)   Resp 18   SpO2 97%  Physical Exam General: Calm Cardiac: Well perfused Lungs: Even respirations Psych: Calm  ED Course / MDM  EKG:EKG Interpretation Date/Time:  Saturday November 30 2022 11:35:24 EDT Ventricular Rate:  76 PR Interval:  166 QRS Duration:  94 QT Interval:  442 QTC Calculation: 497 R Axis:   45  Text Interpretation: Sinus rhythm Anterior infarct, old Confirmed by Coralee Pesa [phone]) on 11/30/2022 4:22:28 PM  I have reviewed the labs performed to date as well as medications administered while in observation.  Recent changes in the last 24 hours include N/A.  Plan  Current plan is for placement.    Maia Plan, MD 12/10/22 [phone]

## 2022-12-01 NOTE — ED Notes (Signed)
Pt took medications without any problems.

## 2022-12-02 DIAGNOSIS — R531 Weakness: Secondary | ICD-10-CM | POA: Diagnosis not present

## 2022-12-02 LAB — CBG MONITORING, ED: Glucose-Capillary: 108 mg/dL — ABNORMAL HIGH (ref 70–99)

## 2022-12-02 MED ORDER — TUBERCULIN PPD 5 UNIT/0.1ML ID SOLN
5.0000 [IU] | INTRADERMAL | Status: AC
  Administered 2022-12-02: 5 [IU] via INTRADERMAL
  Filled 2022-12-02: qty 0.1

## 2022-12-02 MED ORDER — OXYCODONE-ACETAMINOPHEN 5-325 MG PO TABS
1.0000 | ORAL_TABLET | Freq: Once | ORAL | Status: AC
  Administered 2022-12-02: 1 via ORAL
  Filled 2022-12-02: qty 1

## 2022-12-02 NOTE — ED Provider Notes (Signed)
Emergency Medicine Observation Re-evaluation Note  Brooke Weber is a 82 y.o. female, seen on rounds today.  Pt initially presented to the ED for complaints of Fall Currently, the patient is sleeping.  Physical Exam  BP (!) 165/80 (BP Location: Left Arm)   Pulse 83   Temp 98.2 F (36.8 C)   Resp 16   SpO2 96%  Physical Exam General: Sleeping Cardiac: Regular rate Lungs: No distress Psych: Cooperative  ED Course / MDM  EKG:EKG Interpretation Date/Time:  Saturday November 30 2022 11:35:24 EDT Ventricular Rate:  76 PR Interval:  166 QRS Duration:  94 QT Interval:  442 QTC Calculation: 497 R Axis:   45  Text Interpretation: Sinus rhythm Anterior infarct, old Confirmed by Coralee Pesa [phone]) on 11/30/2022 4:22:28 PM  I have reviewed the labs performed to date as well as medications administered while in observation.  Recent changes in the last 24 hours include none.  Plan  Current plan is for social work is working to hopefully place patient at [facility] and has an interview today.Marland Kitchen    Gwyneth Sprout, MD 12/02/22 1155

## 2022-12-02 NOTE — NC FL2 (Signed)
[place] MEDICAID FL2 LEVEL OF CARE FORM     IDENTIFICATION  Patient Name: Brooke Weber Birthdate: 1940/08/02 Sex: female Admission Date (Current Location): 11/30/2022  [facility] and [place] Number:  Producer, television/film/video and Address:  [facility],  [address] [place], [place] [address]      Provider Number: [id number]  Attending Physician Name and Address:  System, Provider Not In  Relative Name and Phone Number:  Eloise Harman (Spouse)  [phone]    Current Level of Care: Hospital Recommended Level of Care: Memory Care Prior Approval Number:    Date Approved/Denied:   PASRR Number: [id number]  Discharge Plan: Other (Comment) (Memory Care)    Current Diagnoses: Patient Active Problem List   Diagnosis Date Noted   Syncope 10/15/2022   Left medial tibial plateau fracture 10/15/2022   Fracture of radial neck, left, closed 10/15/2022   Fall at home, initial encounter 10/15/2022   UTI (urinary tract infection) 10/15/2022   Transient hypotension 10/15/2022   Essential hypertension 09/26/2021   Gastroesophageal reflux disease 09/26/2021   Mild cognitive impairment 09/26/2021   Hypokalemia 09/26/2021   Hyponatremia 09/26/2021   Chronic hyponatremia 09/25/2021   Chronic pain syndrome 03/20/2017   Long-term current use of opiate analgesic 03/20/2017    Orientation RESPIRATION BLADDER Height & Weight     Self  Normal Incontinent Weight:   Height:     BEHAVIORAL SYMPTOMS/MOOD NEUROLOGICAL BOWEL NUTRITION STATUS      Incontinent Diet (Regular)  AMBULATORY STATUS COMMUNICATION OF NEEDS Skin   Limited Assist Verbally Normal                       Personal Care Assistance Level of Assistance  Bathing, Feeding, Dressing Bathing Assistance: Maximum assistance Feeding assistance: Maximum assistance Dressing Assistance: Maximum assistance     Functional Limitations Info  Sight, Hearing, Speech Sight Info: Adequate Hearing Info: Adequate Speech  Info: Adequate    SPECIAL CARE FACTORS FREQUENCY  PT (By licensed PT), OT (By licensed OT)     PT Frequency: x5/week OT Frequency: x5/week            Contractures Contractures Info: Not present    Additional Factors Info  Code Status, Allergies Code Status Info: Full Allergies Info: Aspirin  Bee Venom  Iodine  Other  Penicillins  Seroquel (Quetiapine)           Current Medications (12/02/2022):  This is the current hospital active medication list Current Facility-Administered Medications  Medication Dose Route Frequency Provider Last Rate Last Admin   acetaminophen (TYLENOL) tablet 650 mg  650 mg Oral Q6H PRN Horton, Kristie M, DO   650 mg at 12/02/22 1249   atenolol (TENORMIN) tablet 50 mg  50 mg Oral QHS Horton, Kristie M, DO   50 mg at 12/01/22 2233   atorvastatin (LIPITOR) tablet 20 mg  20 mg Oral QHS Horton, Kristie M, DO   20 mg at 12/01/22 2233   diphenhydrAMINE (BENADRYL) injection 12.5 mg  12.5 mg Intramuscular Once Cardama, Amadeo Garnet, MD       LORazepam (ATIVAN) tablet 1 mg  1 mg Oral Q4H PRN Charlynne Pander, MD   1 mg at 12/02/22 1249   pantoprazole (PROTONIX) EC tablet 40 mg  40 mg Oral Daily Horton, Kristie M, DO   40 mg at 12/02/22 0904   traZODone (DESYREL) tablet 25-50 mg  25-50 mg Oral QHS PRN Rozelle Logan, DO   50 mg at 12/01/22 2001  tuberculin injection 5 Units  5 Units Intradermal STAT Gwyneth Sprout, MD       Current Outpatient Medications  Medication Sig Dispense Refill   acetaminophen (TYLENOL) 325 MG tablet Take 650 mg by mouth every 6 (six) hours as needed for mild pain.     atorvastatin (LIPITOR) 20 MG tablet Take 20 mg by mouth at bedtime.     divalproex (DEPAKOTE) 125 MG DR tablet Take 125 mg by mouth 2 (two) times daily.     LORazepam (ATIVAN) 1 MG tablet Take 1 mg by mouth 2 (two) times daily.     metoprolol tartrate (LOPRESSOR) 25 MG tablet Take 25 mg by mouth 2 (two) times daily.     omeprazole (PRILOSEC) 20 MG capsule Take  20 mg by mouth daily.     Oxycodone HCl 10 MG TABS Take 1 tablet by mouth 4 (four) times daily as needed (pain).     polyethylene glycol (MIRALAX / GLYCOLAX) packet Take 34 g by mouth daily.     senna (SENOKOT) 8.6 MG tablet Take 1 tablet by mouth 2 (two) times daily.     atenolol (TENORMIN) 50 MG tablet Take 1 tablet (50 mg total) by mouth at bedtime. (Patient not taking: Reported on 11/30/2022) 30 tablet 1   naloxone (NARCAN) nasal spray 4 mg/0.1 mL Place 1 spray into the nose once. (Patient not taking: Reported on 11/30/2022)     Polyethyl Glycol-Propyl Glycol (SYSTANE) 0.4-0.3 % SOLN Place 2 drops into both eyes 3 (three) times daily.     traZODone (DESYREL) 50 MG tablet Take 25-50 mg by mouth at bedtime as needed. (Patient not taking: Reported on 11/30/2022)       Discharge Medications: Please see discharge summary for a list of discharge medications.  Relevant Imaging Results:  Relevant Lab Results:   Additional Information SSN: [id number]  Princella Ion, LCSW

## 2022-12-02 NOTE — Progress Notes (Addendum)
CSW has reached out to [place] from [facility]. At this time N/A left V/M awaiting call back.     Addend@ 3:38 pm [facility] did come out to see patient per paramedic Awaiting details on when and if patient can DC to facility.   Addend@ 4:25  This CSW attempted to call Sydney back from [facility]. Maralyn Sago who is also in Systems analyst reported that [place] was gone for the day and TOC would need to follow up in the am 10/01 as she was unaware of the decision. TOC will continue to follow. As of right now [place] has not returned a call with an update on patient

## 2022-12-02 NOTE — Progress Notes (Signed)
Physical Therapy Treatment Patient Details Name: Brooke Weber MRN: [id number] DOB: 20-Apr-1940 Today's Date: 12/02/2022   History of Present Illness 82 y.o. female presents to [facility] ED 11/30/22  after fall at home. Patient DC'd from [facility] 11/29/22  for  L fractured radius and L minimally displaced transverse fracture of the inferior aspect of the patella sustained 10/15/22 . OZD:GUYQIHKV, hypertension, arthritis, anxiety, and GERD    PT Comments  The patient is pleasantly confused, asking for 'Jerry". Patient ambulating well with Rw and +1 assistance.     If plan is discharge home, recommend the following: A little help with walking and/or transfers;A lot of help with bathing/dressing/bathroom;Assistance with cooking/housework;Assist for transportation;Help with stairs or ramp for entrance   Can travel by private vehicle        Equipment Recommendations  None recommended by PT    Recommendations for Other Services       Precautions / Restrictions Precautions Precautions: Fall Restrictions Other Position/Activity Restrictions: unsure, left [facility] 8/19 wit sling and NWB LUE and KI left LE/ WBAT, no updated orders.     Mobility  Bed Mobility Overal bed mobility: Needs Assistance Bed Mobility: Supine to Sit, Sit to Supine     Supine to sit: Contact guard Sit to supine: Contact guard assist   General bed mobility comments: no assistance required    Transfers Overall transfer level: Needs assistance Equipment used: Rolling walker (2 wheels) Transfers: Sit to/from Stand Sit to Stand: Min assist           General transfer comment: min from bed    Ambulation/Gait Ambulation/Gait assistance: Min assist Gait Distance (Feet): 50 Feet Assistive device: Rolling walker (2 wheels) Gait Pattern/deviations: Step-through pattern Gait velocity: decr     General Gait Details: cues for safety assist with RW for turns, gait steady   Stairs             Wheelchair Mobility      Tilt Bed    Modified Rankin (Stroke Patients Only)       Balance Overall balance assessment: Needs assistance, History of Falls Sitting-balance support: Feet supported, Bilateral upper extremity supported Sitting balance-[name]: Fair     Standing balance support: During functional activity, No upper extremity supported, Reliant on assistive device for balance Standing balance-[name]: Poor Standing balance comment: steady support when standing                            Cognition Arousal: Alert Behavior During Therapy: WFL for tasks assessed/performed Overall Cognitive Status: History of cognitive impairments - at baseline                                 General Comments: O x 1        Exercises      General Comments        Pertinent Vitals/Pain Pain Assessment Faces Pain Scale: Hurts little more Pain Location: left knee Pain Descriptors / Indicators: Discomfort Pain Intervention(s): Monitored during session    Home Living                          Prior Function            PT Goals (current goals can now be found in the care plan section) Progress towards PT goals: Progressing toward goals    Frequency  Min 1X/week      PT Plan      Co-evaluation              AM-PAC PT "6 Clicks" Mobility   Outcome Measure    Help needed moving from lying on your back to sitting on the side of a flat bed without using bedrails?: None Help needed moving to and from a bed to a chair (including a wheelchair)?: None Help needed standing up from a chair using your arms (e.g., wheelchair or bedside chair)?: None Help needed to walk in hospital room?: A Little Help needed climbing 3-5 steps with a railing? : A Lot 6 Click Score: 17    End of Session Equipment Utilized During Treatment: Gait belt Activity Tolerance: Patient tolerated treatment well Patient left: in bed;with bed alarm set;with call bell/phone  within reach Nurse Communication: Mobility status PT Visit Diagnosis: Unsteadiness on feet (R26.81);Difficulty in walking, not elsewhere classified (R26.2)     Time: 1478-2956 PT Time Calculation (min) (ACUTE ONLY): 23 min  Charges:    $Gait Training: 23-37 mins PT General Charges $$ ACUTE PT VISIT: 1 Visit                     Blanchard Kelch PT Acute Rehabilitation Services Office [phone] Weekend pager-[phone]    Rada Hay 12/02/2022, 4:50 PM

## 2022-12-02 NOTE — ED Notes (Signed)
Patient is resting  she asked to use the phone to call her husband   she was told that she could use it here shortly after breakfast

## 2022-12-02 NOTE — Progress Notes (Addendum)
CSW spoke with pt and spouse at bedside. Spouse, Mr. Uvaldo Rising, reports not being interested in rehab for pt and states he desires for her to transition into Memory Care due to her advanced dementia.   CSW contacted Raina Mina [phone], [email or web address]) at [facility] who informed an RN will be coming to assess the pt within the hour. Sherron Ales will notify CSW as soon as possible on determination. CSW to request TB test from EDP and send FL2 to [place]. TOC following.   Addend @ 2:10PM FL2 emailed to [place].

## 2022-12-02 NOTE — Progress Notes (Signed)
Bed offers pending.  

## 2022-12-02 NOTE — ED Notes (Signed)
Patient ambulated to restroom with walker and minimal assistance.  Patient back in bed needing constant redirection.  She is confused and attempting to get up to leave.

## 2022-12-02 NOTE — Discharge Planning (Signed)
Licensed Clinical Social Worker is seeking post-discharge placement for this patient at the following level of care: skilled nursing facility    

## 2022-12-03 DIAGNOSIS — R531 Weakness: Secondary | ICD-10-CM | POA: Diagnosis not present

## 2022-12-03 MED ORDER — LORAZEPAM 1 MG PO TABS
1.0000 mg | ORAL_TABLET | ORAL | Status: DC | PRN
  Administered 2022-12-03 – 2022-12-04 (×2): 1 mg via ORAL
  Filled 2022-12-03 (×2): qty 1

## 2022-12-03 NOTE — ED Notes (Signed)
Patient dropped her medication in her cup of coffee and then threw her coffee on Paramedic Januel Doolan

## 2022-12-03 NOTE — Progress Notes (Addendum)
CSW attempted to contact Brooke Weber - left HIPAA compliant voicemail requesting call back.   Addend @ 11:26 AM [facility] will be accepting pt into Memory Care Unit. Possible admission for Thursday pending TB result and preparation of room. TOC following.

## 2022-12-03 NOTE — ED Provider Notes (Signed)
Emergency Medicine Observation Re-evaluation Note  Brooke Weber is a 82 y.o. female, seen on rounds today.  Pt initially presented to the ED for complaints of Fall Currently, the patient is resting comfortably.  Physical Exam  BP (!) 179/79 (BP Location: Right Arm)   Pulse 68   Temp 98.1 F (36.7 C) (Oral)   Resp 14   SpO2 97%  Physical Exam   ED Course / MDM  EKG:EKG Interpretation Date/Time:  Saturday November 30 2022 11:35:24 EDT Ventricular Rate:  76 PR Interval:  166 QRS Duration:  94 QT Interval:  442 QTC Calculation: 497 R Axis:   45  Text Interpretation: Sinus rhythm Anterior infarct, old Confirmed by Coralee Pesa [phone]) on 11/30/2022 4:22:28 PM  I have reviewed the labs performed to date as well as medications administered while in observation.  Recent changes in the last 24 hours include none.  Plan  Current plan is for placement.    Lorre Nick, MD 12/03/22 [phone]

## 2022-12-03 NOTE — ED Notes (Signed)
Pt ambulating around the unit with her walker and NT as stand-by assist to gain some exercise as she been laying in bed most the day. Steady gait observed.

## 2022-12-04 DIAGNOSIS — R531 Weakness: Secondary | ICD-10-CM | POA: Diagnosis not present

## 2022-12-04 NOTE — ED Notes (Signed)
   12/04/22 1700  PPD Results  Does patient have an induration at the injection site? No  Induration(mm) 0 mm

## 2022-12-04 NOTE — Progress Notes (Signed)
Mobility Specialist - Progress Note   12/04/22 1433  Mobility  Activity Ambulated with assistance in hallway  Level of Assistance Standby assist, set-up cues, supervision of patient - no hands on  Assistive Device Front wheel walker  Distance Ambulated (ft) 150 ft  Activity Response Tolerated well  Mobility Referral Yes  $Mobility charge 1 Mobility  Mobility Specialist Start Time (ACUTE ONLY) 0217  Mobility Specialist Stop Time (ACUTE ONLY) 0229  Mobility Specialist Time Calculation (min) (ACUTE ONLY) 12 min   Pt received in bed and agreeable to mobility. Distance limited due to L knee pain. No other complaints during session. Pt to bed after session with all needs met.    [facility]

## 2022-12-04 NOTE — ED Provider Notes (Signed)
Emergency Medicine Observation Re-evaluation Note  Brooke Weber is a 82 y.o. female, seen on rounds today.  Pt initially presented to the ED for complaints of Fall Currently, the patient is awaiting placement.  Physical Exam  BP 111/75 (BP Location: Right Arm)   Pulse (!) 102   Temp 97.6 F (36.4 C) (Oral)   Resp 18   SpO2 99%  Physical Exam Awake and in no acute distress ED Course / MDM  EKG:EKG Interpretation Date/Time:  Saturday November 30 2022 11:35:24 EDT Ventricular Rate:  76 PR Interval:  166 QRS Duration:  94 QT Interval:  442 QTC Calculation: 497 R Axis:   45  Text Interpretation: Sinus rhythm Anterior infarct, old Confirmed by Coralee Pesa [phone]) on 11/30/2022 4:22:28 PM  I have reviewed the labs performed to date as well as medications administered while in observation.  Recent changes in the last 24 hours include none.  Plan  Current plan is for placement.    Bethann Berkshire, MD 12/04/22 [phone]

## 2022-12-04 NOTE — Progress Notes (Addendum)
This RNCM spoke with Kirstin with [facility] who reports she needs the following documents faxed to her at [phone], signed FL2, TB results, supplemental document. Kirstin will fax the supplemental document for review. Kirstin report  patient's husband has not come to sign the paperwork yet, however will meet with her today at 2pm. Kirstin reports patient may need to discharge on Friday due to family not completing paperwork.   This RNCM awaiting TB results and supplement document.  -4:04pm This RNCM notified Kirstin that supplement has not been received, Kirstin will email to this RNCM. Kirstin spoke with patient's husband today and will plan for discharge to the SNF tomorrow at 4pm.  Awaiting TB results and supplemental document.   -5:38pm Notified RN of need for TB results documentation, awaiting update in EPIC.  - 6:45pm This RNCM emailed signed FL2, TB results and supplemental document to Kirstin with [employer].Planned admission for 4pm 12/05/22  TOC will continue to follow

## 2022-12-05 DIAGNOSIS — R531 Weakness: Secondary | ICD-10-CM | POA: Diagnosis not present

## 2022-12-05 NOTE — Care Management (Addendum)
This RNCM spoke with Kirstin with [facility]. All paperwork has been received. Kirstin reports patient can be discharged today and can accept patient at anytime.  Patient will go to room# D4, Report can be called to [phone] to [facility].  - 2:30pm This RNCM spoke with patient's husband Earvin Hansen who declined to use [facility], he prefers to transport her to [facility]. Earvin Hansen will be here at 4pm today to transport patient.   Transportation at discharge: Spouse  Notified EDP, Charity fundraiser.  No additional TOC needs

## 2022-12-05 NOTE — Discharge Instructions (Addendum)
Follow-up with your primary care physician. Continue your home medications and follow up with your primary care physician for a re-evaluation of your current treatments.  If you develop new or worsening or other concerning symptoms then return to the ER or call 911.

## 2022-12-05 NOTE — ED Provider Notes (Addendum)
Emergency Medicine Observation Re-evaluation Note  Brooke Weber is a 82 y.o. female, seen on rounds today.  Pt initially presented to the ED for complaints of Fall Currently, the patient is resting.  Physical Exam  BP (!) 158/73 (BP Location: Left Arm)   Pulse 66   Temp 98.4 F (36.9 C) (Oral)   Resp 18   SpO2 96%  Physical Exam General: no acute distress Lungs: normal effort Psych: no agitation  ED Course / MDM  EKG:EKG Interpretation Date/Time:  Saturday November 30 2022 11:35:24 EDT Ventricular Rate:  76 PR Interval:  166 QRS Duration:  94 QT Interval:  442 QTC Calculation: 497 R Axis:   45  Text Interpretation: Sinus rhythm Anterior infarct, old Confirmed by Coralee Pesa [phone]) on 11/30/2022 4:22:28 PM  I have reviewed the labs performed to date as well as medications administered while in observation.  No recent changes in the last 24 hours.  Plan  Current plan is for placement.    Pricilla Loveless, MD 12/05/22 0802  Patient will be discharged to [facility].    Pricilla Loveless, MD 12/05/22 [phone]

## 2022-12-09 DIAGNOSIS — E785 Hyperlipidemia, unspecified: Secondary | ICD-10-CM | POA: Diagnosis not present

## 2022-12-09 DIAGNOSIS — I10 Essential (primary) hypertension: Secondary | ICD-10-CM | POA: Diagnosis not present

## 2022-12-09 DIAGNOSIS — K5900 Constipation, unspecified: Secondary | ICD-10-CM | POA: Diagnosis not present

## 2022-12-09 DIAGNOSIS — G894 Chronic pain syndrome: Secondary | ICD-10-CM | POA: Diagnosis not present

## 2022-12-09 DIAGNOSIS — H04123 Dry eye syndrome of bilateral lacrimal glands: Secondary | ICD-10-CM | POA: Diagnosis not present

## 2022-12-09 DIAGNOSIS — G4700 Insomnia, unspecified: Secondary | ICD-10-CM | POA: Diagnosis not present

## 2022-12-09 DIAGNOSIS — F03A4 Unspecified dementia, mild, with anxiety: Secondary | ICD-10-CM | POA: Diagnosis not present

## 2022-12-12 DIAGNOSIS — M62522 Muscle wasting and atrophy, not elsewhere classified, left upper arm: Secondary | ICD-10-CM | POA: Diagnosis not present

## 2022-12-12 DIAGNOSIS — M62552 Muscle wasting and atrophy, not elsewhere classified, left thigh: Secondary | ICD-10-CM | POA: Diagnosis not present

## 2022-12-12 DIAGNOSIS — R488 Other symbolic dysfunctions: Secondary | ICD-10-CM | POA: Diagnosis not present

## 2022-12-12 DIAGNOSIS — R4789 Other speech disturbances: Secondary | ICD-10-CM | POA: Diagnosis not present

## 2022-12-12 DIAGNOSIS — R296 Repeated falls: Secondary | ICD-10-CM | POA: Diagnosis not present

## 2022-12-12 DIAGNOSIS — R2681 Unsteadiness on feet: Secondary | ICD-10-CM | POA: Diagnosis not present

## 2022-12-12 DIAGNOSIS — R2689 Other abnormalities of gait and mobility: Secondary | ICD-10-CM | POA: Diagnosis not present

## 2022-12-12 DIAGNOSIS — M62521 Muscle wasting and atrophy, not elsewhere classified, right upper arm: Secondary | ICD-10-CM | POA: Diagnosis not present

## 2022-12-12 DIAGNOSIS — M62551 Muscle wasting and atrophy, not elsewhere classified, right thigh: Secondary | ICD-10-CM | POA: Diagnosis not present

## 2022-12-17 DIAGNOSIS — G8929 Other chronic pain: Secondary | ICD-10-CM | POA: Diagnosis not present

## 2022-12-17 DIAGNOSIS — M62551 Muscle wasting and atrophy, not elsewhere classified, right thigh: Secondary | ICD-10-CM | POA: Diagnosis not present

## 2022-12-17 DIAGNOSIS — R2681 Unsteadiness on feet: Secondary | ICD-10-CM | POA: Diagnosis not present

## 2022-12-17 DIAGNOSIS — R2689 Other abnormalities of gait and mobility: Secondary | ICD-10-CM | POA: Diagnosis not present

## 2022-12-17 DIAGNOSIS — F0390 Unspecified dementia without behavioral disturbance: Secondary | ICD-10-CM | POA: Diagnosis not present

## 2022-12-17 DIAGNOSIS — R488 Other symbolic dysfunctions: Secondary | ICD-10-CM | POA: Diagnosis not present

## 2022-12-17 DIAGNOSIS — F411 Generalized anxiety disorder: Secondary | ICD-10-CM | POA: Diagnosis not present

## 2022-12-17 DIAGNOSIS — R4789 Other speech disturbances: Secondary | ICD-10-CM | POA: Diagnosis not present

## 2022-12-17 DIAGNOSIS — F5105 Insomnia due to other mental disorder: Secondary | ICD-10-CM | POA: Diagnosis not present

## 2022-12-17 DIAGNOSIS — M62521 Muscle wasting and atrophy, not elsewhere classified, right upper arm: Secondary | ICD-10-CM | POA: Diagnosis not present

## 2022-12-17 DIAGNOSIS — F331 Major depressive disorder, recurrent, moderate: Secondary | ICD-10-CM | POA: Diagnosis not present

## 2022-12-18 DIAGNOSIS — R488 Other symbolic dysfunctions: Secondary | ICD-10-CM | POA: Diagnosis not present

## 2022-12-18 DIAGNOSIS — R4789 Other speech disturbances: Secondary | ICD-10-CM | POA: Diagnosis not present

## 2022-12-18 DIAGNOSIS — R2681 Unsteadiness on feet: Secondary | ICD-10-CM | POA: Diagnosis not present

## 2022-12-18 DIAGNOSIS — R2689 Other abnormalities of gait and mobility: Secondary | ICD-10-CM | POA: Diagnosis not present

## 2022-12-18 DIAGNOSIS — M62521 Muscle wasting and atrophy, not elsewhere classified, right upper arm: Secondary | ICD-10-CM | POA: Diagnosis not present

## 2022-12-18 DIAGNOSIS — M62551 Muscle wasting and atrophy, not elsewhere classified, right thigh: Secondary | ICD-10-CM | POA: Diagnosis not present

## 2022-12-19 DIAGNOSIS — R2689 Other abnormalities of gait and mobility: Secondary | ICD-10-CM | POA: Diagnosis not present

## 2022-12-19 DIAGNOSIS — M62551 Muscle wasting and atrophy, not elsewhere classified, right thigh: Secondary | ICD-10-CM | POA: Diagnosis not present

## 2022-12-19 DIAGNOSIS — R488 Other symbolic dysfunctions: Secondary | ICD-10-CM | POA: Diagnosis not present

## 2022-12-19 DIAGNOSIS — R4789 Other speech disturbances: Secondary | ICD-10-CM | POA: Diagnosis not present

## 2022-12-19 DIAGNOSIS — R2681 Unsteadiness on feet: Secondary | ICD-10-CM | POA: Diagnosis not present

## 2022-12-19 DIAGNOSIS — M62521 Muscle wasting and atrophy, not elsewhere classified, right upper arm: Secondary | ICD-10-CM | POA: Diagnosis not present

## 2022-12-20 DIAGNOSIS — M62551 Muscle wasting and atrophy, not elsewhere classified, right thigh: Secondary | ICD-10-CM | POA: Diagnosis not present

## 2022-12-20 DIAGNOSIS — R4789 Other speech disturbances: Secondary | ICD-10-CM | POA: Diagnosis not present

## 2022-12-20 DIAGNOSIS — R2681 Unsteadiness on feet: Secondary | ICD-10-CM | POA: Diagnosis not present

## 2022-12-20 DIAGNOSIS — R2689 Other abnormalities of gait and mobility: Secondary | ICD-10-CM | POA: Diagnosis not present

## 2022-12-20 DIAGNOSIS — R488 Other symbolic dysfunctions: Secondary | ICD-10-CM | POA: Diagnosis not present

## 2022-12-20 DIAGNOSIS — M62521 Muscle wasting and atrophy, not elsewhere classified, right upper arm: Secondary | ICD-10-CM | POA: Diagnosis not present

## 2022-12-23 DIAGNOSIS — J984 Other disorders of lung: Secondary | ICD-10-CM | POA: Diagnosis not present

## 2022-12-23 DIAGNOSIS — M62551 Muscle wasting and atrophy, not elsewhere classified, right thigh: Secondary | ICD-10-CM | POA: Diagnosis not present

## 2022-12-23 DIAGNOSIS — R7611 Nonspecific reaction to tuberculin skin test without active tuberculosis: Secondary | ICD-10-CM | POA: Diagnosis not present

## 2022-12-23 DIAGNOSIS — R2689 Other abnormalities of gait and mobility: Secondary | ICD-10-CM | POA: Diagnosis not present

## 2022-12-23 DIAGNOSIS — R4789 Other speech disturbances: Secondary | ICD-10-CM | POA: Diagnosis not present

## 2022-12-23 DIAGNOSIS — M62521 Muscle wasting and atrophy, not elsewhere classified, right upper arm: Secondary | ICD-10-CM | POA: Diagnosis not present

## 2022-12-23 DIAGNOSIS — R488 Other symbolic dysfunctions: Secondary | ICD-10-CM | POA: Diagnosis not present

## 2022-12-23 DIAGNOSIS — R2681 Unsteadiness on feet: Secondary | ICD-10-CM | POA: Diagnosis not present

## 2022-12-23 DIAGNOSIS — J8410 Pulmonary fibrosis, unspecified: Secondary | ICD-10-CM | POA: Diagnosis not present

## 2022-12-24 DIAGNOSIS — R488 Other symbolic dysfunctions: Secondary | ICD-10-CM | POA: Diagnosis not present

## 2022-12-24 DIAGNOSIS — R2689 Other abnormalities of gait and mobility: Secondary | ICD-10-CM | POA: Diagnosis not present

## 2022-12-24 DIAGNOSIS — R4789 Other speech disturbances: Secondary | ICD-10-CM | POA: Diagnosis not present

## 2022-12-24 DIAGNOSIS — M62551 Muscle wasting and atrophy, not elsewhere classified, right thigh: Secondary | ICD-10-CM | POA: Diagnosis not present

## 2022-12-24 DIAGNOSIS — R2681 Unsteadiness on feet: Secondary | ICD-10-CM | POA: Diagnosis not present

## 2022-12-24 DIAGNOSIS — M62521 Muscle wasting and atrophy, not elsewhere classified, right upper arm: Secondary | ICD-10-CM | POA: Diagnosis not present

## 2022-12-25 DIAGNOSIS — R4789 Other speech disturbances: Secondary | ICD-10-CM | POA: Diagnosis not present

## 2022-12-25 DIAGNOSIS — M62521 Muscle wasting and atrophy, not elsewhere classified, right upper arm: Secondary | ICD-10-CM | POA: Diagnosis not present

## 2022-12-25 DIAGNOSIS — R488 Other symbolic dysfunctions: Secondary | ICD-10-CM | POA: Diagnosis not present

## 2022-12-25 DIAGNOSIS — M62551 Muscle wasting and atrophy, not elsewhere classified, right thigh: Secondary | ICD-10-CM | POA: Diagnosis not present

## 2022-12-25 DIAGNOSIS — R2681 Unsteadiness on feet: Secondary | ICD-10-CM | POA: Diagnosis not present

## 2022-12-25 DIAGNOSIS — R2689 Other abnormalities of gait and mobility: Secondary | ICD-10-CM | POA: Diagnosis not present

## 2022-12-26 DIAGNOSIS — R4789 Other speech disturbances: Secondary | ICD-10-CM | POA: Diagnosis not present

## 2022-12-26 DIAGNOSIS — R2681 Unsteadiness on feet: Secondary | ICD-10-CM | POA: Diagnosis not present

## 2022-12-26 DIAGNOSIS — M62521 Muscle wasting and atrophy, not elsewhere classified, right upper arm: Secondary | ICD-10-CM | POA: Diagnosis not present

## 2022-12-26 DIAGNOSIS — M62551 Muscle wasting and atrophy, not elsewhere classified, right thigh: Secondary | ICD-10-CM | POA: Diagnosis not present

## 2022-12-26 DIAGNOSIS — R488 Other symbolic dysfunctions: Secondary | ICD-10-CM | POA: Diagnosis not present

## 2022-12-26 DIAGNOSIS — R2689 Other abnormalities of gait and mobility: Secondary | ICD-10-CM | POA: Diagnosis not present

## 2022-12-27 DIAGNOSIS — R4789 Other speech disturbances: Secondary | ICD-10-CM | POA: Diagnosis not present

## 2022-12-27 DIAGNOSIS — R2681 Unsteadiness on feet: Secondary | ICD-10-CM | POA: Diagnosis not present

## 2022-12-27 DIAGNOSIS — R2689 Other abnormalities of gait and mobility: Secondary | ICD-10-CM | POA: Diagnosis not present

## 2022-12-27 DIAGNOSIS — R488 Other symbolic dysfunctions: Secondary | ICD-10-CM | POA: Diagnosis not present

## 2022-12-27 DIAGNOSIS — M62551 Muscle wasting and atrophy, not elsewhere classified, right thigh: Secondary | ICD-10-CM | POA: Diagnosis not present

## 2022-12-27 DIAGNOSIS — M62521 Muscle wasting and atrophy, not elsewhere classified, right upper arm: Secondary | ICD-10-CM | POA: Diagnosis not present

## 2022-12-30 DIAGNOSIS — F0390 Unspecified dementia without behavioral disturbance: Secondary | ICD-10-CM | POA: Diagnosis not present

## 2022-12-30 DIAGNOSIS — M62551 Muscle wasting and atrophy, not elsewhere classified, right thigh: Secondary | ICD-10-CM | POA: Diagnosis not present

## 2022-12-30 DIAGNOSIS — F5105 Insomnia due to other mental disorder: Secondary | ICD-10-CM | POA: Diagnosis not present

## 2022-12-30 DIAGNOSIS — R4789 Other speech disturbances: Secondary | ICD-10-CM | POA: Diagnosis not present

## 2022-12-30 DIAGNOSIS — G8929 Other chronic pain: Secondary | ICD-10-CM | POA: Diagnosis not present

## 2022-12-30 DIAGNOSIS — F411 Generalized anxiety disorder: Secondary | ICD-10-CM | POA: Diagnosis not present

## 2022-12-30 DIAGNOSIS — M62521 Muscle wasting and atrophy, not elsewhere classified, right upper arm: Secondary | ICD-10-CM | POA: Diagnosis not present

## 2022-12-30 DIAGNOSIS — R2689 Other abnormalities of gait and mobility: Secondary | ICD-10-CM | POA: Diagnosis not present

## 2022-12-30 DIAGNOSIS — R2681 Unsteadiness on feet: Secondary | ICD-10-CM | POA: Diagnosis not present

## 2022-12-30 DIAGNOSIS — F331 Major depressive disorder, recurrent, moderate: Secondary | ICD-10-CM | POA: Diagnosis not present

## 2022-12-30 DIAGNOSIS — R488 Other symbolic dysfunctions: Secondary | ICD-10-CM | POA: Diagnosis not present

## 2023-01-01 DIAGNOSIS — R4789 Other speech disturbances: Secondary | ICD-10-CM | POA: Diagnosis not present

## 2023-01-01 DIAGNOSIS — M62521 Muscle wasting and atrophy, not elsewhere classified, right upper arm: Secondary | ICD-10-CM | POA: Diagnosis not present

## 2023-01-01 DIAGNOSIS — R2689 Other abnormalities of gait and mobility: Secondary | ICD-10-CM | POA: Diagnosis not present

## 2023-01-01 DIAGNOSIS — R2681 Unsteadiness on feet: Secondary | ICD-10-CM | POA: Diagnosis not present

## 2023-01-01 DIAGNOSIS — M62551 Muscle wasting and atrophy, not elsewhere classified, right thigh: Secondary | ICD-10-CM | POA: Diagnosis not present

## 2023-01-01 DIAGNOSIS — R488 Other symbolic dysfunctions: Secondary | ICD-10-CM | POA: Diagnosis not present

## 2023-01-02 DIAGNOSIS — R2681 Unsteadiness on feet: Secondary | ICD-10-CM | POA: Diagnosis not present

## 2023-01-02 DIAGNOSIS — M62551 Muscle wasting and atrophy, not elsewhere classified, right thigh: Secondary | ICD-10-CM | POA: Diagnosis not present

## 2023-01-02 DIAGNOSIS — M62521 Muscle wasting and atrophy, not elsewhere classified, right upper arm: Secondary | ICD-10-CM | POA: Diagnosis not present

## 2023-01-02 DIAGNOSIS — R488 Other symbolic dysfunctions: Secondary | ICD-10-CM | POA: Diagnosis not present

## 2023-01-02 DIAGNOSIS — R4789 Other speech disturbances: Secondary | ICD-10-CM | POA: Diagnosis not present

## 2023-01-02 DIAGNOSIS — R2689 Other abnormalities of gait and mobility: Secondary | ICD-10-CM | POA: Diagnosis not present

## 2023-01-03 DIAGNOSIS — M62552 Muscle wasting and atrophy, not elsewhere classified, left thigh: Secondary | ICD-10-CM | POA: Diagnosis not present

## 2023-01-03 DIAGNOSIS — M62551 Muscle wasting and atrophy, not elsewhere classified, right thigh: Secondary | ICD-10-CM | POA: Diagnosis not present

## 2023-01-03 DIAGNOSIS — R296 Repeated falls: Secondary | ICD-10-CM | POA: Diagnosis not present

## 2023-01-03 DIAGNOSIS — R2681 Unsteadiness on feet: Secondary | ICD-10-CM | POA: Diagnosis not present

## 2023-01-03 DIAGNOSIS — R2689 Other abnormalities of gait and mobility: Secondary | ICD-10-CM | POA: Diagnosis not present

## 2023-01-03 DIAGNOSIS — R4789 Other speech disturbances: Secondary | ICD-10-CM | POA: Diagnosis not present

## 2023-01-03 DIAGNOSIS — M62521 Muscle wasting and atrophy, not elsewhere classified, right upper arm: Secondary | ICD-10-CM | POA: Diagnosis not present

## 2023-01-03 DIAGNOSIS — R488 Other symbolic dysfunctions: Secondary | ICD-10-CM | POA: Diagnosis not present

## 2023-01-03 DIAGNOSIS — M62522 Muscle wasting and atrophy, not elsewhere classified, left upper arm: Secondary | ICD-10-CM | POA: Diagnosis not present

## 2023-01-06 DIAGNOSIS — R2689 Other abnormalities of gait and mobility: Secondary | ICD-10-CM | POA: Diagnosis not present

## 2023-01-06 DIAGNOSIS — R2681 Unsteadiness on feet: Secondary | ICD-10-CM | POA: Diagnosis not present

## 2023-01-06 DIAGNOSIS — R4789 Other speech disturbances: Secondary | ICD-10-CM | POA: Diagnosis not present

## 2023-01-06 DIAGNOSIS — M62521 Muscle wasting and atrophy, not elsewhere classified, right upper arm: Secondary | ICD-10-CM | POA: Diagnosis not present

## 2023-01-06 DIAGNOSIS — M62551 Muscle wasting and atrophy, not elsewhere classified, right thigh: Secondary | ICD-10-CM | POA: Diagnosis not present

## 2023-01-06 DIAGNOSIS — R488 Other symbolic dysfunctions: Secondary | ICD-10-CM | POA: Diagnosis not present

## 2023-01-07 DIAGNOSIS — M62551 Muscle wasting and atrophy, not elsewhere classified, right thigh: Secondary | ICD-10-CM | POA: Diagnosis not present

## 2023-01-07 DIAGNOSIS — M62521 Muscle wasting and atrophy, not elsewhere classified, right upper arm: Secondary | ICD-10-CM | POA: Diagnosis not present

## 2023-01-07 DIAGNOSIS — R2681 Unsteadiness on feet: Secondary | ICD-10-CM | POA: Diagnosis not present

## 2023-01-07 DIAGNOSIS — R488 Other symbolic dysfunctions: Secondary | ICD-10-CM | POA: Diagnosis not present

## 2023-01-07 DIAGNOSIS — R4789 Other speech disturbances: Secondary | ICD-10-CM | POA: Diagnosis not present

## 2023-01-07 DIAGNOSIS — R2689 Other abnormalities of gait and mobility: Secondary | ICD-10-CM | POA: Diagnosis not present

## 2023-01-08 DIAGNOSIS — M62551 Muscle wasting and atrophy, not elsewhere classified, right thigh: Secondary | ICD-10-CM | POA: Diagnosis not present

## 2023-01-08 DIAGNOSIS — M62521 Muscle wasting and atrophy, not elsewhere classified, right upper arm: Secondary | ICD-10-CM | POA: Diagnosis not present

## 2023-01-08 DIAGNOSIS — I10 Essential (primary) hypertension: Secondary | ICD-10-CM | POA: Diagnosis not present

## 2023-01-08 DIAGNOSIS — R488 Other symbolic dysfunctions: Secondary | ICD-10-CM | POA: Diagnosis not present

## 2023-01-08 DIAGNOSIS — K219 Gastro-esophageal reflux disease without esophagitis: Secondary | ICD-10-CM | POA: Diagnosis not present

## 2023-01-08 DIAGNOSIS — G4700 Insomnia, unspecified: Secondary | ICD-10-CM | POA: Diagnosis not present

## 2023-01-08 DIAGNOSIS — E785 Hyperlipidemia, unspecified: Secondary | ICD-10-CM | POA: Diagnosis not present

## 2023-01-08 DIAGNOSIS — R4789 Other speech disturbances: Secondary | ICD-10-CM | POA: Diagnosis not present

## 2023-01-08 DIAGNOSIS — H04123 Dry eye syndrome of bilateral lacrimal glands: Secondary | ICD-10-CM | POA: Diagnosis not present

## 2023-01-08 DIAGNOSIS — R2689 Other abnormalities of gait and mobility: Secondary | ICD-10-CM | POA: Diagnosis not present

## 2023-01-08 DIAGNOSIS — G894 Chronic pain syndrome: Secondary | ICD-10-CM | POA: Diagnosis not present

## 2023-01-08 DIAGNOSIS — K5900 Constipation, unspecified: Secondary | ICD-10-CM | POA: Diagnosis not present

## 2023-01-08 DIAGNOSIS — R2681 Unsteadiness on feet: Secondary | ICD-10-CM | POA: Diagnosis not present

## 2023-01-08 DIAGNOSIS — F03A4 Unspecified dementia, mild, with anxiety: Secondary | ICD-10-CM | POA: Diagnosis not present

## 2023-01-08 DIAGNOSIS — R600 Localized edema: Secondary | ICD-10-CM | POA: Diagnosis not present

## 2023-01-10 DIAGNOSIS — M62551 Muscle wasting and atrophy, not elsewhere classified, right thigh: Secondary | ICD-10-CM | POA: Diagnosis not present

## 2023-01-10 DIAGNOSIS — R2689 Other abnormalities of gait and mobility: Secondary | ICD-10-CM | POA: Diagnosis not present

## 2023-01-10 DIAGNOSIS — R2681 Unsteadiness on feet: Secondary | ICD-10-CM | POA: Diagnosis not present

## 2023-01-10 DIAGNOSIS — M62521 Muscle wasting and atrophy, not elsewhere classified, right upper arm: Secondary | ICD-10-CM | POA: Diagnosis not present

## 2023-01-10 DIAGNOSIS — R488 Other symbolic dysfunctions: Secondary | ICD-10-CM | POA: Diagnosis not present

## 2023-01-10 DIAGNOSIS — R4789 Other speech disturbances: Secondary | ICD-10-CM | POA: Diagnosis not present

## 2023-01-13 DIAGNOSIS — M62521 Muscle wasting and atrophy, not elsewhere classified, right upper arm: Secondary | ICD-10-CM | POA: Diagnosis not present

## 2023-01-13 DIAGNOSIS — F411 Generalized anxiety disorder: Secondary | ICD-10-CM | POA: Diagnosis not present

## 2023-01-13 DIAGNOSIS — R4789 Other speech disturbances: Secondary | ICD-10-CM | POA: Diagnosis not present

## 2023-01-13 DIAGNOSIS — R2689 Other abnormalities of gait and mobility: Secondary | ICD-10-CM | POA: Diagnosis not present

## 2023-01-13 DIAGNOSIS — M62551 Muscle wasting and atrophy, not elsewhere classified, right thigh: Secondary | ICD-10-CM | POA: Diagnosis not present

## 2023-01-13 DIAGNOSIS — F331 Major depressive disorder, recurrent, moderate: Secondary | ICD-10-CM | POA: Diagnosis not present

## 2023-01-13 DIAGNOSIS — R2681 Unsteadiness on feet: Secondary | ICD-10-CM | POA: Diagnosis not present

## 2023-01-13 DIAGNOSIS — G8929 Other chronic pain: Secondary | ICD-10-CM | POA: Diagnosis not present

## 2023-01-13 DIAGNOSIS — F5105 Insomnia due to other mental disorder: Secondary | ICD-10-CM | POA: Diagnosis not present

## 2023-01-13 DIAGNOSIS — R488 Other symbolic dysfunctions: Secondary | ICD-10-CM | POA: Diagnosis not present

## 2023-01-14 DIAGNOSIS — M62521 Muscle wasting and atrophy, not elsewhere classified, right upper arm: Secondary | ICD-10-CM | POA: Diagnosis not present

## 2023-01-14 DIAGNOSIS — M62551 Muscle wasting and atrophy, not elsewhere classified, right thigh: Secondary | ICD-10-CM | POA: Diagnosis not present

## 2023-01-14 DIAGNOSIS — R2681 Unsteadiness on feet: Secondary | ICD-10-CM | POA: Diagnosis not present

## 2023-01-14 DIAGNOSIS — R2689 Other abnormalities of gait and mobility: Secondary | ICD-10-CM | POA: Diagnosis not present

## 2023-01-14 DIAGNOSIS — R4789 Other speech disturbances: Secondary | ICD-10-CM | POA: Diagnosis not present

## 2023-01-14 DIAGNOSIS — R488 Other symbolic dysfunctions: Secondary | ICD-10-CM | POA: Diagnosis not present

## 2023-01-15 DIAGNOSIS — R2681 Unsteadiness on feet: Secondary | ICD-10-CM | POA: Diagnosis not present

## 2023-01-15 DIAGNOSIS — R4789 Other speech disturbances: Secondary | ICD-10-CM | POA: Diagnosis not present

## 2023-01-15 DIAGNOSIS — R2689 Other abnormalities of gait and mobility: Secondary | ICD-10-CM | POA: Diagnosis not present

## 2023-01-15 DIAGNOSIS — M62551 Muscle wasting and atrophy, not elsewhere classified, right thigh: Secondary | ICD-10-CM | POA: Diagnosis not present

## 2023-01-15 DIAGNOSIS — M62521 Muscle wasting and atrophy, not elsewhere classified, right upper arm: Secondary | ICD-10-CM | POA: Diagnosis not present

## 2023-01-15 DIAGNOSIS — R488 Other symbolic dysfunctions: Secondary | ICD-10-CM | POA: Diagnosis not present

## 2023-01-16 DIAGNOSIS — I10 Essential (primary) hypertension: Secondary | ICD-10-CM | POA: Diagnosis not present

## 2023-01-16 DIAGNOSIS — E782 Mixed hyperlipidemia: Secondary | ICD-10-CM | POA: Diagnosis not present

## 2023-01-16 DIAGNOSIS — E8881 Metabolic syndrome: Secondary | ICD-10-CM | POA: Diagnosis not present

## 2023-01-16 DIAGNOSIS — R4789 Other speech disturbances: Secondary | ICD-10-CM | POA: Diagnosis not present

## 2023-01-16 DIAGNOSIS — R488 Other symbolic dysfunctions: Secondary | ICD-10-CM | POA: Diagnosis not present

## 2023-01-16 DIAGNOSIS — L659 Nonscarring hair loss, unspecified: Secondary | ICD-10-CM | POA: Diagnosis not present

## 2023-01-16 DIAGNOSIS — M62551 Muscle wasting and atrophy, not elsewhere classified, right thigh: Secondary | ICD-10-CM | POA: Diagnosis not present

## 2023-01-16 DIAGNOSIS — R2689 Other abnormalities of gait and mobility: Secondary | ICD-10-CM | POA: Diagnosis not present

## 2023-01-16 DIAGNOSIS — M62521 Muscle wasting and atrophy, not elsewhere classified, right upper arm: Secondary | ICD-10-CM | POA: Diagnosis not present

## 2023-01-16 DIAGNOSIS — S82002D Unspecified fracture of left patella, subsequent encounter for closed fracture with routine healing: Secondary | ICD-10-CM | POA: Diagnosis not present

## 2023-01-16 DIAGNOSIS — R2681 Unsteadiness on feet: Secondary | ICD-10-CM | POA: Diagnosis not present

## 2023-01-17 DIAGNOSIS — R4789 Other speech disturbances: Secondary | ICD-10-CM | POA: Diagnosis not present

## 2023-01-17 DIAGNOSIS — M62551 Muscle wasting and atrophy, not elsewhere classified, right thigh: Secondary | ICD-10-CM | POA: Diagnosis not present

## 2023-01-17 DIAGNOSIS — R2681 Unsteadiness on feet: Secondary | ICD-10-CM | POA: Diagnosis not present

## 2023-01-17 DIAGNOSIS — R488 Other symbolic dysfunctions: Secondary | ICD-10-CM | POA: Diagnosis not present

## 2023-01-17 DIAGNOSIS — M62521 Muscle wasting and atrophy, not elsewhere classified, right upper arm: Secondary | ICD-10-CM | POA: Diagnosis not present

## 2023-01-17 DIAGNOSIS — R2689 Other abnormalities of gait and mobility: Secondary | ICD-10-CM | POA: Diagnosis not present

## 2023-01-20 DIAGNOSIS — R488 Other symbolic dysfunctions: Secondary | ICD-10-CM | POA: Diagnosis not present

## 2023-01-20 DIAGNOSIS — M62521 Muscle wasting and atrophy, not elsewhere classified, right upper arm: Secondary | ICD-10-CM | POA: Diagnosis not present

## 2023-01-20 DIAGNOSIS — R4789 Other speech disturbances: Secondary | ICD-10-CM | POA: Diagnosis not present

## 2023-01-20 DIAGNOSIS — M62551 Muscle wasting and atrophy, not elsewhere classified, right thigh: Secondary | ICD-10-CM | POA: Diagnosis not present

## 2023-01-20 DIAGNOSIS — R2681 Unsteadiness on feet: Secondary | ICD-10-CM | POA: Diagnosis not present

## 2023-01-20 DIAGNOSIS — R2689 Other abnormalities of gait and mobility: Secondary | ICD-10-CM | POA: Diagnosis not present

## 2023-01-21 DIAGNOSIS — R2681 Unsteadiness on feet: Secondary | ICD-10-CM | POA: Diagnosis not present

## 2023-01-21 DIAGNOSIS — M62551 Muscle wasting and atrophy, not elsewhere classified, right thigh: Secondary | ICD-10-CM | POA: Diagnosis not present

## 2023-01-21 DIAGNOSIS — M62521 Muscle wasting and atrophy, not elsewhere classified, right upper arm: Secondary | ICD-10-CM | POA: Diagnosis not present

## 2023-01-21 DIAGNOSIS — R488 Other symbolic dysfunctions: Secondary | ICD-10-CM | POA: Diagnosis not present

## 2023-01-21 DIAGNOSIS — R4789 Other speech disturbances: Secondary | ICD-10-CM | POA: Diagnosis not present

## 2023-01-21 DIAGNOSIS — R2689 Other abnormalities of gait and mobility: Secondary | ICD-10-CM | POA: Diagnosis not present

## 2023-01-22 DIAGNOSIS — Z0000 Encounter for general adult medical examination without abnormal findings: Secondary | ICD-10-CM | POA: Diagnosis not present

## 2023-01-22 DIAGNOSIS — K219 Gastro-esophageal reflux disease without esophagitis: Secondary | ICD-10-CM | POA: Diagnosis not present

## 2023-01-22 DIAGNOSIS — R4789 Other speech disturbances: Secondary | ICD-10-CM | POA: Diagnosis not present

## 2023-01-22 DIAGNOSIS — M62521 Muscle wasting and atrophy, not elsewhere classified, right upper arm: Secondary | ICD-10-CM | POA: Diagnosis not present

## 2023-01-22 DIAGNOSIS — I10 Essential (primary) hypertension: Secondary | ICD-10-CM | POA: Diagnosis not present

## 2023-01-22 DIAGNOSIS — M62551 Muscle wasting and atrophy, not elsewhere classified, right thigh: Secondary | ICD-10-CM | POA: Diagnosis not present

## 2023-01-22 DIAGNOSIS — R488 Other symbolic dysfunctions: Secondary | ICD-10-CM | POA: Diagnosis not present

## 2023-01-22 DIAGNOSIS — E782 Mixed hyperlipidemia: Secondary | ICD-10-CM | POA: Diagnosis not present

## 2023-01-22 DIAGNOSIS — R2689 Other abnormalities of gait and mobility: Secondary | ICD-10-CM | POA: Diagnosis not present

## 2023-01-22 DIAGNOSIS — F411 Generalized anxiety disorder: Secondary | ICD-10-CM | POA: Diagnosis not present

## 2023-01-22 DIAGNOSIS — R2681 Unsteadiness on feet: Secondary | ICD-10-CM | POA: Diagnosis not present

## 2023-01-23 DIAGNOSIS — M62521 Muscle wasting and atrophy, not elsewhere classified, right upper arm: Secondary | ICD-10-CM | POA: Diagnosis not present

## 2023-01-23 DIAGNOSIS — R2689 Other abnormalities of gait and mobility: Secondary | ICD-10-CM | POA: Diagnosis not present

## 2023-01-23 DIAGNOSIS — R4789 Other speech disturbances: Secondary | ICD-10-CM | POA: Diagnosis not present

## 2023-01-23 DIAGNOSIS — R488 Other symbolic dysfunctions: Secondary | ICD-10-CM | POA: Diagnosis not present

## 2023-01-23 DIAGNOSIS — M62551 Muscle wasting and atrophy, not elsewhere classified, right thigh: Secondary | ICD-10-CM | POA: Diagnosis not present

## 2023-01-23 DIAGNOSIS — R2681 Unsteadiness on feet: Secondary | ICD-10-CM | POA: Diagnosis not present

## 2023-01-24 DIAGNOSIS — M62551 Muscle wasting and atrophy, not elsewhere classified, right thigh: Secondary | ICD-10-CM | POA: Diagnosis not present

## 2023-01-24 DIAGNOSIS — R4789 Other speech disturbances: Secondary | ICD-10-CM | POA: Diagnosis not present

## 2023-01-24 DIAGNOSIS — M62521 Muscle wasting and atrophy, not elsewhere classified, right upper arm: Secondary | ICD-10-CM | POA: Diagnosis not present

## 2023-01-24 DIAGNOSIS — R2689 Other abnormalities of gait and mobility: Secondary | ICD-10-CM | POA: Diagnosis not present

## 2023-01-24 DIAGNOSIS — R2681 Unsteadiness on feet: Secondary | ICD-10-CM | POA: Diagnosis not present

## 2023-01-24 DIAGNOSIS — R488 Other symbolic dysfunctions: Secondary | ICD-10-CM | POA: Diagnosis not present

## 2023-01-26 DIAGNOSIS — R2681 Unsteadiness on feet: Secondary | ICD-10-CM | POA: Diagnosis not present

## 2023-01-26 DIAGNOSIS — R488 Other symbolic dysfunctions: Secondary | ICD-10-CM | POA: Diagnosis not present

## 2023-01-26 DIAGNOSIS — R4789 Other speech disturbances: Secondary | ICD-10-CM | POA: Diagnosis not present

## 2023-01-26 DIAGNOSIS — M62551 Muscle wasting and atrophy, not elsewhere classified, right thigh: Secondary | ICD-10-CM | POA: Diagnosis not present

## 2023-01-26 DIAGNOSIS — M62521 Muscle wasting and atrophy, not elsewhere classified, right upper arm: Secondary | ICD-10-CM | POA: Diagnosis not present

## 2023-01-26 DIAGNOSIS — R2689 Other abnormalities of gait and mobility: Secondary | ICD-10-CM | POA: Diagnosis not present

## 2023-01-27 DIAGNOSIS — E785 Hyperlipidemia, unspecified: Secondary | ICD-10-CM | POA: Diagnosis not present

## 2023-01-27 DIAGNOSIS — R4789 Other speech disturbances: Secondary | ICD-10-CM | POA: Diagnosis not present

## 2023-01-27 DIAGNOSIS — M62551 Muscle wasting and atrophy, not elsewhere classified, right thigh: Secondary | ICD-10-CM | POA: Diagnosis not present

## 2023-01-27 DIAGNOSIS — I10 Essential (primary) hypertension: Secondary | ICD-10-CM | POA: Diagnosis not present

## 2023-01-27 DIAGNOSIS — M62521 Muscle wasting and atrophy, not elsewhere classified, right upper arm: Secondary | ICD-10-CM | POA: Diagnosis not present

## 2023-01-27 DIAGNOSIS — R488 Other symbolic dysfunctions: Secondary | ICD-10-CM | POA: Diagnosis not present

## 2023-01-27 DIAGNOSIS — R2689 Other abnormalities of gait and mobility: Secondary | ICD-10-CM | POA: Diagnosis not present

## 2023-01-27 DIAGNOSIS — R2681 Unsteadiness on feet: Secondary | ICD-10-CM | POA: Diagnosis not present

## 2023-01-28 DIAGNOSIS — R2689 Other abnormalities of gait and mobility: Secondary | ICD-10-CM | POA: Diagnosis not present

## 2023-01-28 DIAGNOSIS — M62521 Muscle wasting and atrophy, not elsewhere classified, right upper arm: Secondary | ICD-10-CM | POA: Diagnosis not present

## 2023-01-28 DIAGNOSIS — R2681 Unsteadiness on feet: Secondary | ICD-10-CM | POA: Diagnosis not present

## 2023-01-28 DIAGNOSIS — R488 Other symbolic dysfunctions: Secondary | ICD-10-CM | POA: Diagnosis not present

## 2023-01-28 DIAGNOSIS — R4789 Other speech disturbances: Secondary | ICD-10-CM | POA: Diagnosis not present

## 2023-01-28 DIAGNOSIS — M62551 Muscle wasting and atrophy, not elsewhere classified, right thigh: Secondary | ICD-10-CM | POA: Diagnosis not present

## 2023-01-29 DIAGNOSIS — M62521 Muscle wasting and atrophy, not elsewhere classified, right upper arm: Secondary | ICD-10-CM | POA: Diagnosis not present

## 2023-01-29 DIAGNOSIS — M62551 Muscle wasting and atrophy, not elsewhere classified, right thigh: Secondary | ICD-10-CM | POA: Diagnosis not present

## 2023-01-29 DIAGNOSIS — R2689 Other abnormalities of gait and mobility: Secondary | ICD-10-CM | POA: Diagnosis not present

## 2023-01-29 DIAGNOSIS — R2681 Unsteadiness on feet: Secondary | ICD-10-CM | POA: Diagnosis not present

## 2023-01-29 DIAGNOSIS — R4789 Other speech disturbances: Secondary | ICD-10-CM | POA: Diagnosis not present

## 2023-01-29 DIAGNOSIS — R488 Other symbolic dysfunctions: Secondary | ICD-10-CM | POA: Diagnosis not present

## 2023-01-31 DIAGNOSIS — R4789 Other speech disturbances: Secondary | ICD-10-CM | POA: Diagnosis not present

## 2023-01-31 DIAGNOSIS — R2689 Other abnormalities of gait and mobility: Secondary | ICD-10-CM | POA: Diagnosis not present

## 2023-01-31 DIAGNOSIS — R488 Other symbolic dysfunctions: Secondary | ICD-10-CM | POA: Diagnosis not present

## 2023-01-31 DIAGNOSIS — M62551 Muscle wasting and atrophy, not elsewhere classified, right thigh: Secondary | ICD-10-CM | POA: Diagnosis not present

## 2023-01-31 DIAGNOSIS — R2681 Unsteadiness on feet: Secondary | ICD-10-CM | POA: Diagnosis not present

## 2023-01-31 DIAGNOSIS — M62521 Muscle wasting and atrophy, not elsewhere classified, right upper arm: Secondary | ICD-10-CM | POA: Diagnosis not present

## 2023-02-02 DIAGNOSIS — R2689 Other abnormalities of gait and mobility: Secondary | ICD-10-CM | POA: Diagnosis not present

## 2023-02-02 DIAGNOSIS — M62552 Muscle wasting and atrophy, not elsewhere classified, left thigh: Secondary | ICD-10-CM | POA: Diagnosis not present

## 2023-02-02 DIAGNOSIS — R488 Other symbolic dysfunctions: Secondary | ICD-10-CM | POA: Diagnosis not present

## 2023-02-02 DIAGNOSIS — M62522 Muscle wasting and atrophy, not elsewhere classified, left upper arm: Secondary | ICD-10-CM | POA: Diagnosis not present

## 2023-02-02 DIAGNOSIS — R2681 Unsteadiness on feet: Secondary | ICD-10-CM | POA: Diagnosis not present

## 2023-02-02 DIAGNOSIS — R296 Repeated falls: Secondary | ICD-10-CM | POA: Diagnosis not present

## 2023-02-02 DIAGNOSIS — M62521 Muscle wasting and atrophy, not elsewhere classified, right upper arm: Secondary | ICD-10-CM | POA: Diagnosis not present

## 2023-02-02 DIAGNOSIS — R4789 Other speech disturbances: Secondary | ICD-10-CM | POA: Diagnosis not present

## 2023-02-02 DIAGNOSIS — M62551 Muscle wasting and atrophy, not elsewhere classified, right thigh: Secondary | ICD-10-CM | POA: Diagnosis not present

## 2023-02-03 DIAGNOSIS — M62521 Muscle wasting and atrophy, not elsewhere classified, right upper arm: Secondary | ICD-10-CM | POA: Diagnosis not present

## 2023-02-03 DIAGNOSIS — R2681 Unsteadiness on feet: Secondary | ICD-10-CM | POA: Diagnosis not present

## 2023-02-03 DIAGNOSIS — R488 Other symbolic dysfunctions: Secondary | ICD-10-CM | POA: Diagnosis not present

## 2023-02-03 DIAGNOSIS — E785 Hyperlipidemia, unspecified: Secondary | ICD-10-CM | POA: Diagnosis not present

## 2023-02-03 DIAGNOSIS — R2689 Other abnormalities of gait and mobility: Secondary | ICD-10-CM | POA: Diagnosis not present

## 2023-02-03 DIAGNOSIS — K5900 Constipation, unspecified: Secondary | ICD-10-CM | POA: Diagnosis not present

## 2023-02-03 DIAGNOSIS — R4789 Other speech disturbances: Secondary | ICD-10-CM | POA: Diagnosis not present

## 2023-02-03 DIAGNOSIS — R600 Localized edema: Secondary | ICD-10-CM | POA: Diagnosis not present

## 2023-02-03 DIAGNOSIS — H04123 Dry eye syndrome of bilateral lacrimal glands: Secondary | ICD-10-CM | POA: Diagnosis not present

## 2023-02-03 DIAGNOSIS — I10 Essential (primary) hypertension: Secondary | ICD-10-CM | POA: Diagnosis not present

## 2023-02-03 DIAGNOSIS — M62551 Muscle wasting and atrophy, not elsewhere classified, right thigh: Secondary | ICD-10-CM | POA: Diagnosis not present

## 2023-02-03 DIAGNOSIS — K219 Gastro-esophageal reflux disease without esophagitis: Secondary | ICD-10-CM | POA: Diagnosis not present

## 2023-02-03 DIAGNOSIS — F03A4 Unspecified dementia, mild, with anxiety: Secondary | ICD-10-CM | POA: Diagnosis not present

## 2023-02-03 DIAGNOSIS — G894 Chronic pain syndrome: Secondary | ICD-10-CM | POA: Diagnosis not present

## 2023-02-03 DIAGNOSIS — G4700 Insomnia, unspecified: Secondary | ICD-10-CM | POA: Diagnosis not present

## 2023-02-04 DIAGNOSIS — M62551 Muscle wasting and atrophy, not elsewhere classified, right thigh: Secondary | ICD-10-CM | POA: Diagnosis not present

## 2023-02-04 DIAGNOSIS — R2689 Other abnormalities of gait and mobility: Secondary | ICD-10-CM | POA: Diagnosis not present

## 2023-02-04 DIAGNOSIS — R2681 Unsteadiness on feet: Secondary | ICD-10-CM | POA: Diagnosis not present

## 2023-02-04 DIAGNOSIS — M62521 Muscle wasting and atrophy, not elsewhere classified, right upper arm: Secondary | ICD-10-CM | POA: Diagnosis not present

## 2023-02-04 DIAGNOSIS — R488 Other symbolic dysfunctions: Secondary | ICD-10-CM | POA: Diagnosis not present

## 2023-02-04 DIAGNOSIS — R4789 Other speech disturbances: Secondary | ICD-10-CM | POA: Diagnosis not present

## 2023-02-05 DIAGNOSIS — R4789 Other speech disturbances: Secondary | ICD-10-CM | POA: Diagnosis not present

## 2023-02-05 DIAGNOSIS — M62551 Muscle wasting and atrophy, not elsewhere classified, right thigh: Secondary | ICD-10-CM | POA: Diagnosis not present

## 2023-02-05 DIAGNOSIS — R2689 Other abnormalities of gait and mobility: Secondary | ICD-10-CM | POA: Diagnosis not present

## 2023-02-05 DIAGNOSIS — B351 Tinea unguium: Secondary | ICD-10-CM | POA: Diagnosis not present

## 2023-02-05 DIAGNOSIS — M79675 Pain in left toe(s): Secondary | ICD-10-CM | POA: Diagnosis not present

## 2023-02-05 DIAGNOSIS — R2681 Unsteadiness on feet: Secondary | ICD-10-CM | POA: Diagnosis not present

## 2023-02-05 DIAGNOSIS — R488 Other symbolic dysfunctions: Secondary | ICD-10-CM | POA: Diagnosis not present

## 2023-02-05 DIAGNOSIS — M79674 Pain in right toe(s): Secondary | ICD-10-CM | POA: Diagnosis not present

## 2023-02-05 DIAGNOSIS — M62521 Muscle wasting and atrophy, not elsewhere classified, right upper arm: Secondary | ICD-10-CM | POA: Diagnosis not present

## 2023-02-06 DIAGNOSIS — R600 Localized edema: Secondary | ICD-10-CM | POA: Diagnosis not present

## 2023-02-06 DIAGNOSIS — I10 Essential (primary) hypertension: Secondary | ICD-10-CM | POA: Diagnosis not present

## 2023-02-07 DIAGNOSIS — R2689 Other abnormalities of gait and mobility: Secondary | ICD-10-CM | POA: Diagnosis not present

## 2023-02-07 DIAGNOSIS — R4789 Other speech disturbances: Secondary | ICD-10-CM | POA: Diagnosis not present

## 2023-02-07 DIAGNOSIS — M62551 Muscle wasting and atrophy, not elsewhere classified, right thigh: Secondary | ICD-10-CM | POA: Diagnosis not present

## 2023-02-07 DIAGNOSIS — R2681 Unsteadiness on feet: Secondary | ICD-10-CM | POA: Diagnosis not present

## 2023-02-07 DIAGNOSIS — R488 Other symbolic dysfunctions: Secondary | ICD-10-CM | POA: Diagnosis not present

## 2023-02-07 DIAGNOSIS — M62521 Muscle wasting and atrophy, not elsewhere classified, right upper arm: Secondary | ICD-10-CM | POA: Diagnosis not present

## 2023-02-10 DIAGNOSIS — R2689 Other abnormalities of gait and mobility: Secondary | ICD-10-CM | POA: Diagnosis not present

## 2023-02-10 DIAGNOSIS — M62551 Muscle wasting and atrophy, not elsewhere classified, right thigh: Secondary | ICD-10-CM | POA: Diagnosis not present

## 2023-02-10 DIAGNOSIS — F331 Major depressive disorder, recurrent, moderate: Secondary | ICD-10-CM | POA: Diagnosis not present

## 2023-02-10 DIAGNOSIS — G8929 Other chronic pain: Secondary | ICD-10-CM | POA: Diagnosis not present

## 2023-02-10 DIAGNOSIS — R4789 Other speech disturbances: Secondary | ICD-10-CM | POA: Diagnosis not present

## 2023-02-10 DIAGNOSIS — R051 Acute cough: Secondary | ICD-10-CM | POA: Diagnosis not present

## 2023-02-10 DIAGNOSIS — F03A4 Unspecified dementia, mild, with anxiety: Secondary | ICD-10-CM | POA: Diagnosis not present

## 2023-02-10 DIAGNOSIS — R600 Localized edema: Secondary | ICD-10-CM | POA: Diagnosis not present

## 2023-02-10 DIAGNOSIS — R2681 Unsteadiness on feet: Secondary | ICD-10-CM | POA: Diagnosis not present

## 2023-02-10 DIAGNOSIS — R488 Other symbolic dysfunctions: Secondary | ICD-10-CM | POA: Diagnosis not present

## 2023-02-10 DIAGNOSIS — F5105 Insomnia due to other mental disorder: Secondary | ICD-10-CM | POA: Diagnosis not present

## 2023-02-10 DIAGNOSIS — M62521 Muscle wasting and atrophy, not elsewhere classified, right upper arm: Secondary | ICD-10-CM | POA: Diagnosis not present

## 2023-02-10 DIAGNOSIS — F411 Generalized anxiety disorder: Secondary | ICD-10-CM | POA: Diagnosis not present

## 2023-02-17 DIAGNOSIS — R4789 Other speech disturbances: Secondary | ICD-10-CM | POA: Diagnosis not present

## 2023-02-17 DIAGNOSIS — R2689 Other abnormalities of gait and mobility: Secondary | ICD-10-CM | POA: Diagnosis not present

## 2023-02-17 DIAGNOSIS — R2681 Unsteadiness on feet: Secondary | ICD-10-CM | POA: Diagnosis not present

## 2023-02-17 DIAGNOSIS — M62551 Muscle wasting and atrophy, not elsewhere classified, right thigh: Secondary | ICD-10-CM | POA: Diagnosis not present

## 2023-02-17 DIAGNOSIS — R488 Other symbolic dysfunctions: Secondary | ICD-10-CM | POA: Diagnosis not present

## 2023-02-17 DIAGNOSIS — M62521 Muscle wasting and atrophy, not elsewhere classified, right upper arm: Secondary | ICD-10-CM | POA: Diagnosis not present

## 2023-02-18 DIAGNOSIS — M62521 Muscle wasting and atrophy, not elsewhere classified, right upper arm: Secondary | ICD-10-CM | POA: Diagnosis not present

## 2023-02-18 DIAGNOSIS — R2681 Unsteadiness on feet: Secondary | ICD-10-CM | POA: Diagnosis not present

## 2023-02-18 DIAGNOSIS — R488 Other symbolic dysfunctions: Secondary | ICD-10-CM | POA: Diagnosis not present

## 2023-02-18 DIAGNOSIS — R2689 Other abnormalities of gait and mobility: Secondary | ICD-10-CM | POA: Diagnosis not present

## 2023-02-18 DIAGNOSIS — M62551 Muscle wasting and atrophy, not elsewhere classified, right thigh: Secondary | ICD-10-CM | POA: Diagnosis not present

## 2023-02-18 DIAGNOSIS — R4789 Other speech disturbances: Secondary | ICD-10-CM | POA: Diagnosis not present

## 2023-02-19 DIAGNOSIS — M62521 Muscle wasting and atrophy, not elsewhere classified, right upper arm: Secondary | ICD-10-CM | POA: Diagnosis not present

## 2023-02-19 DIAGNOSIS — R4789 Other speech disturbances: Secondary | ICD-10-CM | POA: Diagnosis not present

## 2023-02-19 DIAGNOSIS — R2689 Other abnormalities of gait and mobility: Secondary | ICD-10-CM | POA: Diagnosis not present

## 2023-02-19 DIAGNOSIS — R2681 Unsteadiness on feet: Secondary | ICD-10-CM | POA: Diagnosis not present

## 2023-02-19 DIAGNOSIS — R488 Other symbolic dysfunctions: Secondary | ICD-10-CM | POA: Diagnosis not present

## 2023-02-19 DIAGNOSIS — M62551 Muscle wasting and atrophy, not elsewhere classified, right thigh: Secondary | ICD-10-CM | POA: Diagnosis not present

## 2023-02-20 DIAGNOSIS — M62521 Muscle wasting and atrophy, not elsewhere classified, right upper arm: Secondary | ICD-10-CM | POA: Diagnosis not present

## 2023-02-20 DIAGNOSIS — R2681 Unsteadiness on feet: Secondary | ICD-10-CM | POA: Diagnosis not present

## 2023-02-20 DIAGNOSIS — M62551 Muscle wasting and atrophy, not elsewhere classified, right thigh: Secondary | ICD-10-CM | POA: Diagnosis not present

## 2023-02-20 DIAGNOSIS — R4789 Other speech disturbances: Secondary | ICD-10-CM | POA: Diagnosis not present

## 2023-02-20 DIAGNOSIS — R2689 Other abnormalities of gait and mobility: Secondary | ICD-10-CM | POA: Diagnosis not present

## 2023-02-20 DIAGNOSIS — R488 Other symbolic dysfunctions: Secondary | ICD-10-CM | POA: Diagnosis not present

## 2023-02-21 DIAGNOSIS — R488 Other symbolic dysfunctions: Secondary | ICD-10-CM | POA: Diagnosis not present

## 2023-02-21 DIAGNOSIS — M62521 Muscle wasting and atrophy, not elsewhere classified, right upper arm: Secondary | ICD-10-CM | POA: Diagnosis not present

## 2023-02-21 DIAGNOSIS — R2689 Other abnormalities of gait and mobility: Secondary | ICD-10-CM | POA: Diagnosis not present

## 2023-02-21 DIAGNOSIS — R2681 Unsteadiness on feet: Secondary | ICD-10-CM | POA: Diagnosis not present

## 2023-02-21 DIAGNOSIS — R4789 Other speech disturbances: Secondary | ICD-10-CM | POA: Diagnosis not present

## 2023-02-21 DIAGNOSIS — M62551 Muscle wasting and atrophy, not elsewhere classified, right thigh: Secondary | ICD-10-CM | POA: Diagnosis not present

## 2023-02-24 DIAGNOSIS — I10 Essential (primary) hypertension: Secondary | ICD-10-CM | POA: Diagnosis not present

## 2023-02-24 DIAGNOSIS — E785 Hyperlipidemia, unspecified: Secondary | ICD-10-CM | POA: Diagnosis not present

## 2023-03-04 DIAGNOSIS — I10 Essential (primary) hypertension: Secondary | ICD-10-CM | POA: Diagnosis not present

## 2023-03-04 DIAGNOSIS — G479 Sleep disorder, unspecified: Secondary | ICD-10-CM | POA: Diagnosis not present

## 2023-03-04 DIAGNOSIS — F411 Generalized anxiety disorder: Secondary | ICD-10-CM | POA: Diagnosis not present

## 2023-03-04 DIAGNOSIS — Z6827 Body mass index (BMI) 27.0-27.9, adult: Secondary | ICD-10-CM | POA: Diagnosis not present

## 2023-03-04 DIAGNOSIS — R059 Cough, unspecified: Secondary | ICD-10-CM | POA: Diagnosis not present

## 2023-03-04 DIAGNOSIS — E782 Mixed hyperlipidemia: Secondary | ICD-10-CM | POA: Diagnosis not present

## 2023-03-14 DIAGNOSIS — M25511 Pain in right shoulder: Secondary | ICD-10-CM | POA: Diagnosis not present

## 2023-03-25 DIAGNOSIS — M6281 Muscle weakness (generalized): Secondary | ICD-10-CM | POA: Diagnosis not present

## 2023-03-25 DIAGNOSIS — S46011D Strain of muscle(s) and tendon(s) of the rotator cuff of right shoulder, subsequent encounter: Secondary | ICD-10-CM | POA: Diagnosis not present

## 2023-03-25 DIAGNOSIS — M25611 Stiffness of right shoulder, not elsewhere classified: Secondary | ICD-10-CM | POA: Diagnosis not present

## 2023-04-09 DIAGNOSIS — S46011D Strain of muscle(s) and tendon(s) of the rotator cuff of right shoulder, subsequent encounter: Secondary | ICD-10-CM | POA: Diagnosis not present

## 2023-04-09 DIAGNOSIS — M6281 Muscle weakness (generalized): Secondary | ICD-10-CM | POA: Diagnosis not present

## 2023-04-09 DIAGNOSIS — M25611 Stiffness of right shoulder, not elsewhere classified: Secondary | ICD-10-CM | POA: Diagnosis not present

## 2023-04-15 DIAGNOSIS — M25511 Pain in right shoulder: Secondary | ICD-10-CM | POA: Diagnosis not present

## 2023-04-15 DIAGNOSIS — Z79891 Long term (current) use of opiate analgesic: Secondary | ICD-10-CM | POA: Diagnosis not present

## 2023-04-15 DIAGNOSIS — M51369 Other intervertebral disc degeneration, lumbar region without mention of lumbar back pain or lower extremity pain: Secondary | ICD-10-CM | POA: Diagnosis not present

## 2023-04-15 DIAGNOSIS — G894 Chronic pain syndrome: Secondary | ICD-10-CM | POA: Diagnosis not present

## 2023-04-15 DIAGNOSIS — M25561 Pain in right knee: Secondary | ICD-10-CM | POA: Diagnosis not present

## 2023-04-16 DIAGNOSIS — M6281 Muscle weakness (generalized): Secondary | ICD-10-CM | POA: Diagnosis not present

## 2023-04-16 DIAGNOSIS — S46011D Strain of muscle(s) and tendon(s) of the rotator cuff of right shoulder, subsequent encounter: Secondary | ICD-10-CM | POA: Diagnosis not present

## 2023-04-16 DIAGNOSIS — M25611 Stiffness of right shoulder, not elsewhere classified: Secondary | ICD-10-CM | POA: Diagnosis not present

## 2023-06-22 IMAGING — CT CT HEAD W/O CM
3 of 4 series · 13 of 47 positions shown, 15 images · non-contrast
Comparison: None.

CLINICAL DATA: Neck and head trauma following fall.

EXAM:
CT HEAD WITHOUT CONTRAST
CT CERVICAL SPINE WITHOUT CONTRAST
TECHNIQUE: Multidetector CT imaging of the head and cervical spine was
performed following the standard protocol without intravenous
contrast. Multiplanar CT image reconstructions of the cervical spine
were also generated.

[Series 3: head without · axial · non-contrast · 0.42mm/px · z∈[-183,-43]mm · 7 of 38 slices shown, 9 images]
[im 5/38  brain]
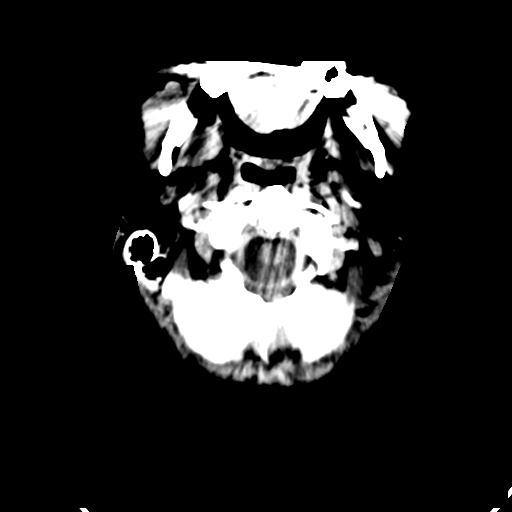
[im 5/38  bone]
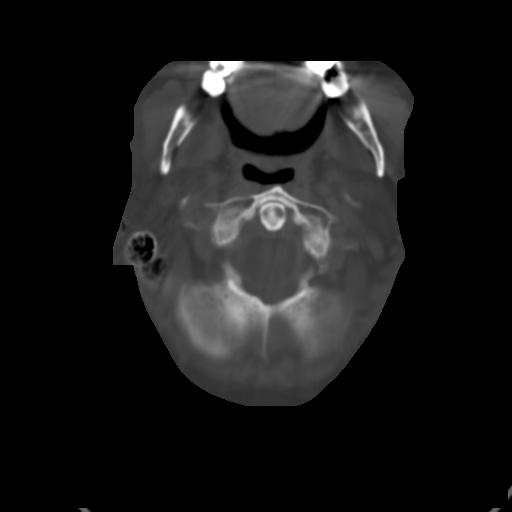
[im 10/38  brain]
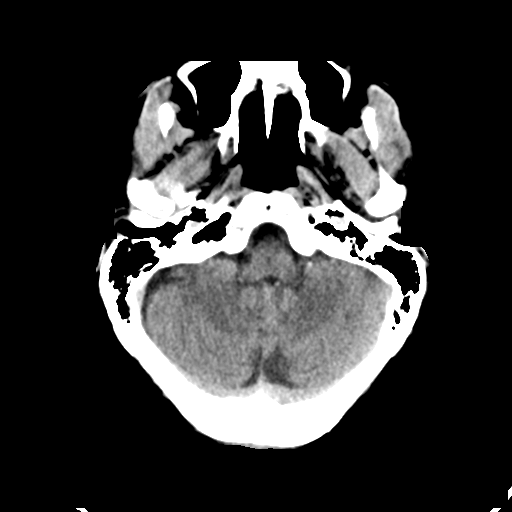
[im 14/38  brain]
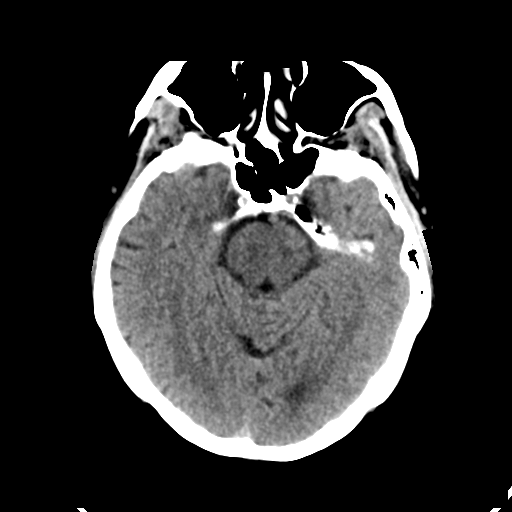
[im 19/38  brain]
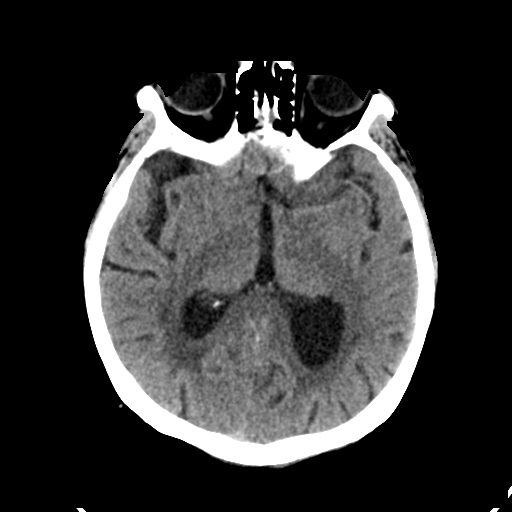
[im 24/38  brain]
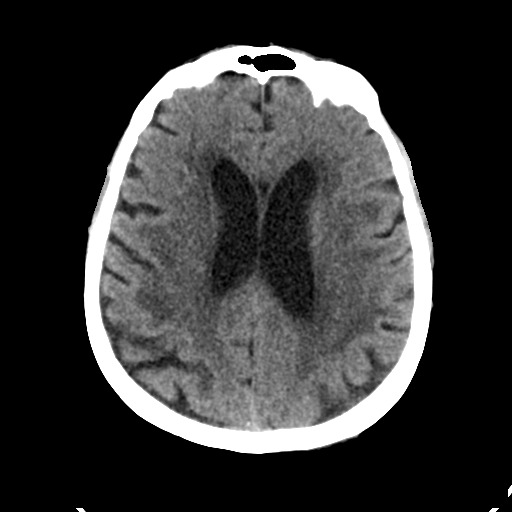
[im 24/38  bone]
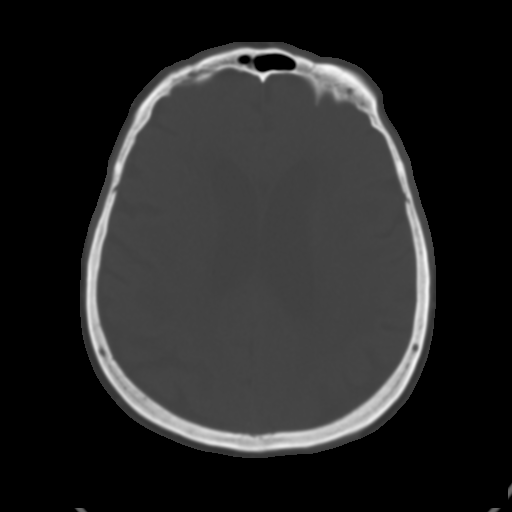
[im 28/38  brain]
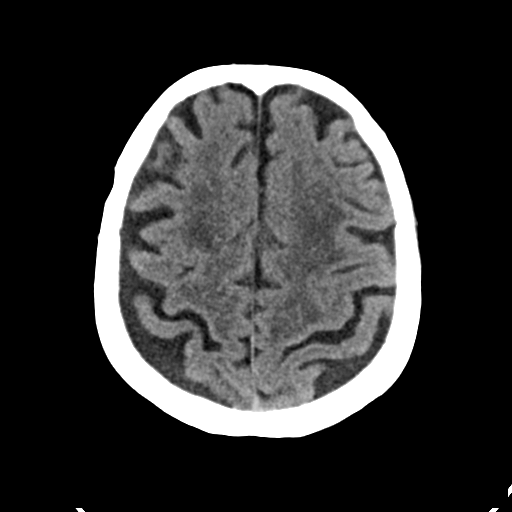
[im 33/38  brain]
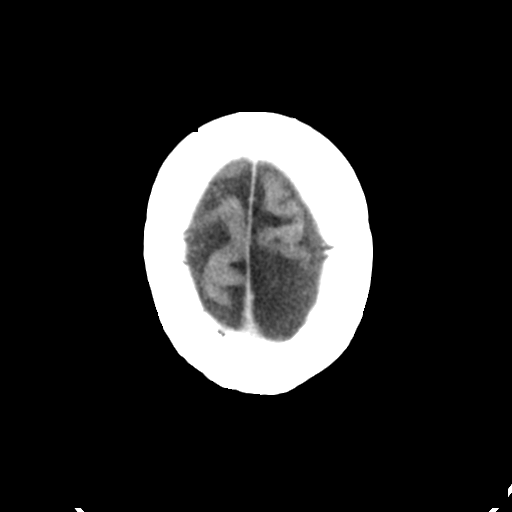

[Series 5: head without cor · coronal · non-contrast · 0.37mm/px · 3 of 69 slices shown]
[im 26/69  brain]
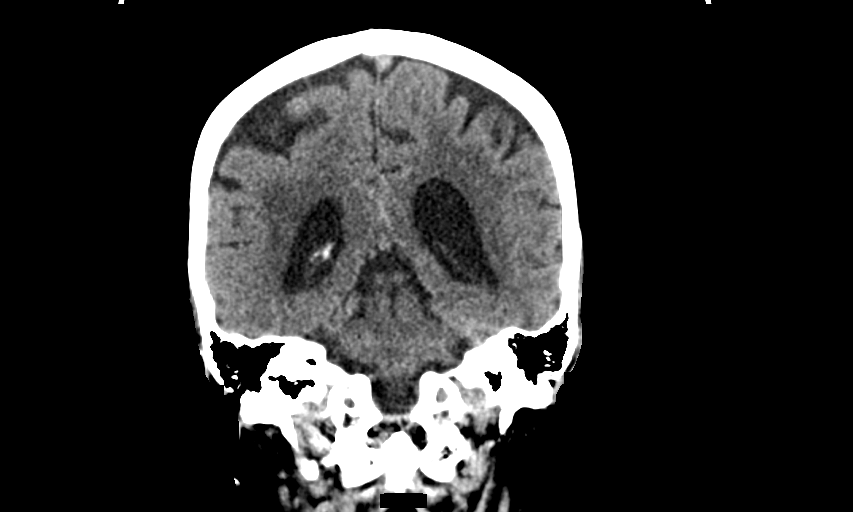
[im 32/69  brain]
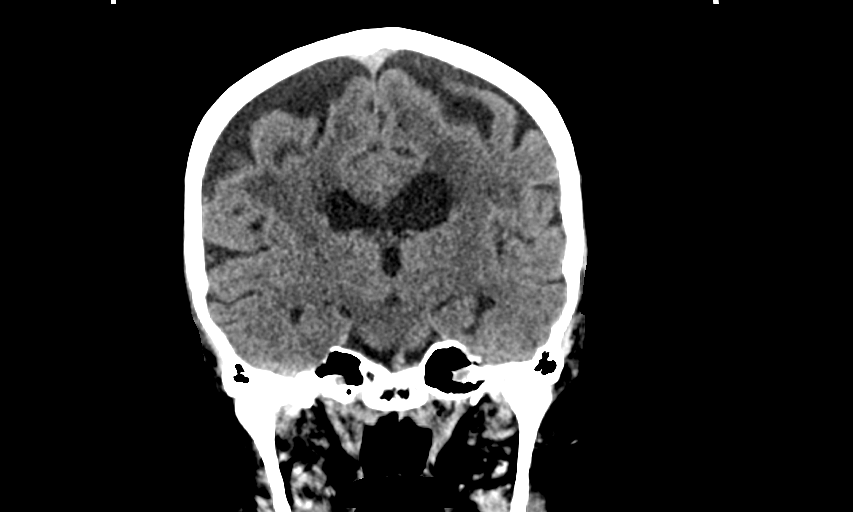
[im 37/69  brain]
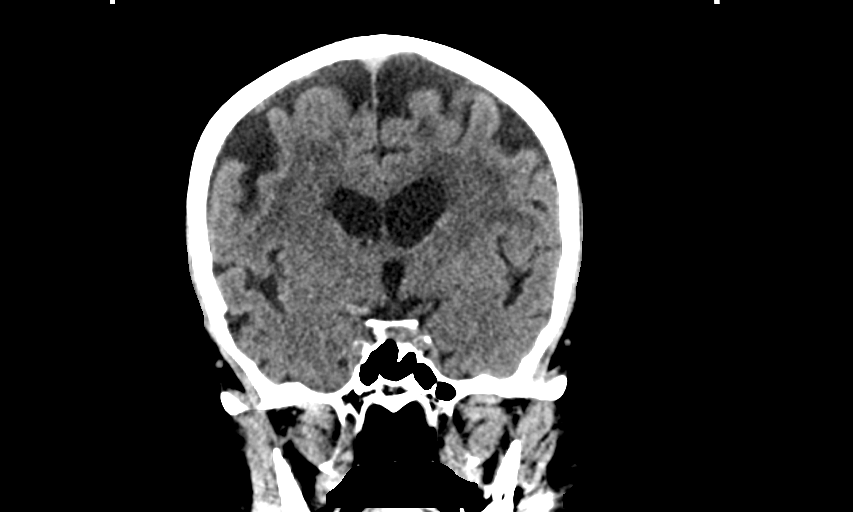

[Series 6: head without sag · sagittal · non-contrast · 0.37mm/px · 3 of 67 slices shown]
[im 24/67  brain]
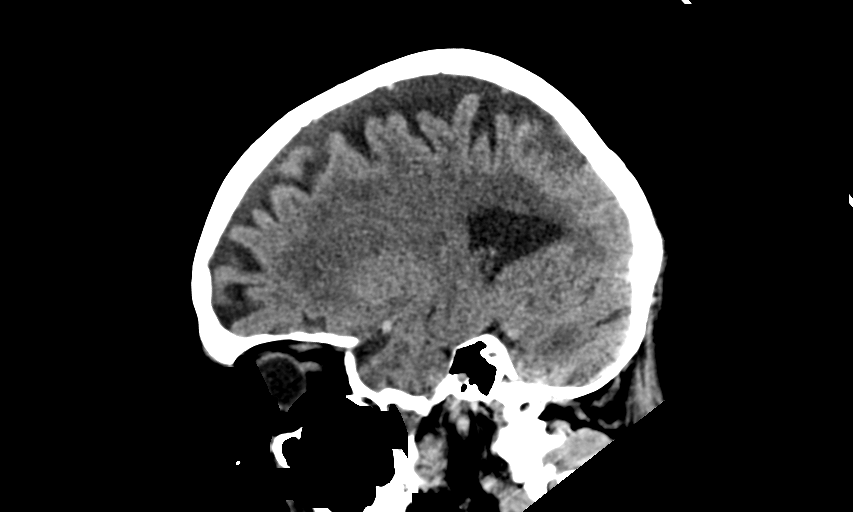
[im 34/67  brain]
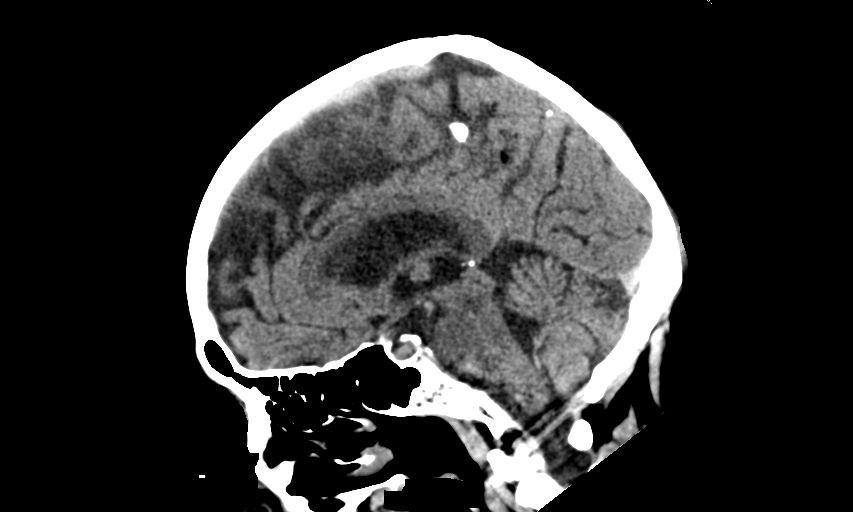
[im 43/67  brain]
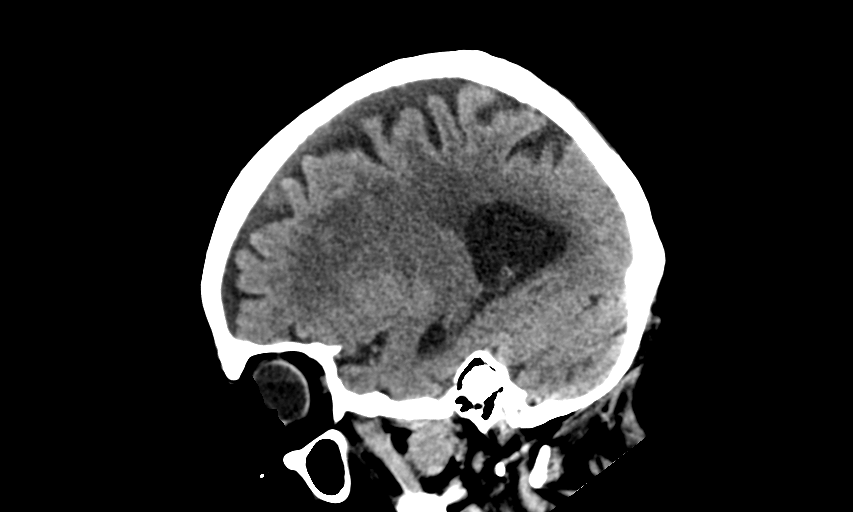

[13 of 47 positions shown; findings below may reference images not displayed]

FINDINGS: CT HEAD FINDINGS

Brain: No acute intracranial hemorrhage, midline shift or mass
effect. No extra-axial fluid collection. There is diffuse atrophy.
Subcortical and periventricular white matter hypodensities are noted
bilaterally. There is no hydrocephalus.

Vascular: Atherosclerotic calcification of the carotid siphons. No
hyperdense vessel.

Skull: Normal. Negative for fracture or focal lesion.

Sinuses/Orbits: No acute finding.

Other: None.

CT CERVICAL SPINE FINDINGS

Alignment: Normal.

Skull base and vertebrae: No acute fracture. No primary bone lesion
or focal pathologic process.

Soft tissues and spinal canal: No prevertebral fluid or swelling. No
visible canal hematoma.

Disc levels: Multilevel intervertebral disc space narrowing, facet
arthropathy, and osteophyte formation is noted with no significant
spinal canal stenosis. Anterior spinal fusion hardware is noted from
C4-C7 with no evidence of hardware loosening.

Upper chest: Mild apical pleural scarring is noted bilaterally.

Other: Osteopenia is noted. The right lobe of the thyroid gland is
heterogeneous in attenuation with multiple hypodense nodules.
IMPRESSION: 1. No acute intracranial hemorrhage.
2. Atrophy with extensive chronic microvascular ischemic changes.
3. Degenerative changes with no acute fracture in the cervical
spine.
4. Anterior cervical spinal fusion hardware from C4-C7 without
evidence of hardware loosening.
5. Hypodense nodules in the right lobe of the thyroid gland.
Ultrasound is suggested for further evaluation and follow-up.

## 2023-07-30 DIAGNOSIS — G8929 Other chronic pain: Secondary | ICD-10-CM | POA: Diagnosis not present

## 2023-07-30 DIAGNOSIS — M25511 Pain in right shoulder: Secondary | ICD-10-CM | POA: Diagnosis not present

## 2023-07-30 DIAGNOSIS — L608 Other nail disorders: Secondary | ICD-10-CM | POA: Diagnosis not present

## 2023-08-06 ENCOUNTER — Ambulatory Visit (INDEPENDENT_AMBULATORY_CARE_PROVIDER_SITE_OTHER): Admitting: Podiatry

## 2023-08-06 DIAGNOSIS — M79674 Pain in right toe(s): Secondary | ICD-10-CM

## 2023-08-06 DIAGNOSIS — B351 Tinea unguium: Secondary | ICD-10-CM

## 2023-08-06 DIAGNOSIS — M79675 Pain in left toe(s): Secondary | ICD-10-CM | POA: Diagnosis not present

## 2023-08-06 NOTE — Progress Notes (Signed)
  Subjective:  Patient ID: Brooke Weber, female    DOB: [birth year],  MRN: [id number]  Chief Complaint  Patient presents with   Nail Problem    Left foot nail issue no pain or discomfort    83 y.o. female returns for the above complaint.  Patient presents with thickened onychodystrophy mycotic toenails x 10 mild pain on palpation.  Patient would like for me to debride down she has noted to herself denies any other complaints.  Objective:  There were no vitals filed for this visit. Podiatric Exam: Vascular: dorsalis pedis and posterior tibial pulses are palpable bilateral. Capillary return is immediate. Temperature gradient is WNL. Skin turgor WNL  Sensorium: Normal Semmes Weinstein monofilament test. Normal tactile sensation bilaterally. Nail Exam: Pt has thick disfigured discolored nails with subungual debris noted bilateral entire nail hallux through fifth toenails.  Pain on palpation to the nails. Ulcer Exam: There is no evidence of ulcer or pre-ulcerative changes or infection. Orthopedic Exam: Muscle tone and strength are WNL. No limitations in general ROM. No crepitus or effusions noted.  Skin: No Porokeratosis. No infection or ulcers    Assessment & Plan:   1. Pain due to onychomycosis of toenails of both feet     Patient was evaluated and treated and all questions answered.  Onychomycosis with pain  -Nails palliatively debrided as below. -Educated on self-care  Procedure: Nail Debridement Rationale: pain  Type of Debridement: manual, sharp debridement. Instrumentation: Nail nipper, rotary burr. Number of Nails: 10  Procedures and Treatment: Consent by patient was obtained for treatment procedures. The patient understood the discussion of treatment and procedures well. All questions were answered thoroughly reviewed. Debridement of mycotic and hypertrophic toenails, 1 through 5 bilateral and clearing of subungual debris. No ulceration, no infection noted.  Return  Visit-Office Procedure: Patient instructed to return to the office for a follow up visit 3 months for continued evaluation and treatment.  Tinnie Forehand, DPM    No follow-ups on file.

## 2023-08-13 DIAGNOSIS — G8929 Other chronic pain: Secondary | ICD-10-CM | POA: Diagnosis not present

## 2023-08-13 DIAGNOSIS — Z711 Person with feared health complaint in whom no diagnosis is made: Secondary | ICD-10-CM | POA: Diagnosis not present

## 2023-08-13 DIAGNOSIS — M51369 Other intervertebral disc degeneration, lumbar region without mention of lumbar back pain or lower extremity pain: Secondary | ICD-10-CM | POA: Diagnosis not present

## 2023-08-13 DIAGNOSIS — Z79891 Long term (current) use of opiate analgesic: Secondary | ICD-10-CM | POA: Diagnosis not present

## 2023-08-13 DIAGNOSIS — G894 Chronic pain syndrome: Secondary | ICD-10-CM | POA: Diagnosis not present

## 2023-09-10 DIAGNOSIS — M545 Low back pain, unspecified: Secondary | ICD-10-CM | POA: Diagnosis not present

## 2023-09-10 DIAGNOSIS — I10 Essential (primary) hypertension: Secondary | ICD-10-CM | POA: Diagnosis not present

## 2023-09-10 DIAGNOSIS — G8929 Other chronic pain: Secondary | ICD-10-CM | POA: Diagnosis not present

## 2023-09-30 DIAGNOSIS — L821 Other seborrheic keratosis: Secondary | ICD-10-CM | POA: Diagnosis not present

## 2023-09-30 DIAGNOSIS — D225 Melanocytic nevi of trunk: Secondary | ICD-10-CM | POA: Diagnosis not present

## 2023-09-30 DIAGNOSIS — L570 Actinic keratosis: Secondary | ICD-10-CM | POA: Diagnosis not present

## 2023-09-30 DIAGNOSIS — D1801 Hemangioma of skin and subcutaneous tissue: Secondary | ICD-10-CM | POA: Diagnosis not present

## 2023-09-30 DIAGNOSIS — Z85828 Personal history of other malignant neoplasm of skin: Secondary | ICD-10-CM | POA: Diagnosis not present

## 2023-11-01 DIAGNOSIS — Z23 Encounter for immunization: Secondary | ICD-10-CM | POA: Diagnosis not present

## 2023-11-07 ENCOUNTER — Ambulatory Visit: Admitting: Podiatry

## 2023-12-15 DIAGNOSIS — Z79891 Long term (current) use of opiate analgesic: Secondary | ICD-10-CM | POA: Diagnosis not present

## 2023-12-15 DIAGNOSIS — Z79899 Other long term (current) drug therapy: Secondary | ICD-10-CM | POA: Diagnosis not present

## 2023-12-15 DIAGNOSIS — Z5181 Encounter for therapeutic drug level monitoring: Secondary | ICD-10-CM | POA: Diagnosis not present

## 2023-12-15 DIAGNOSIS — M51369 Other intervertebral disc degeneration, lumbar region without mention of lumbar back pain or lower extremity pain: Secondary | ICD-10-CM | POA: Diagnosis not present
